# Patient Record
Sex: Male | Born: 1965 | Race: Black or African American | Hispanic: Refuse to answer | Marital: Single | Smoking: Current some day smoker
Health system: Southern US, Community
[De-identification: ages and names within clinical notes are randomized; demographics above are authoritative.]

## PROBLEM LIST (undated history)

## (undated) DIAGNOSIS — M48061 Spinal stenosis, lumbar region without neurogenic claudication: Secondary | ICD-10-CM

## (undated) DIAGNOSIS — M51379 Other intervertebral disc degeneration, lumbosacral region without mention of lumbar back pain or lower extremity pain: Secondary | ICD-10-CM

## (undated) DIAGNOSIS — E1165 Type 2 diabetes mellitus with hyperglycemia: Secondary | ICD-10-CM

## (undated) DIAGNOSIS — G894 Chronic pain syndrome: Secondary | ICD-10-CM

## (undated) DIAGNOSIS — I152 Hypertension secondary to endocrine disorders: Secondary | ICD-10-CM

## (undated) DIAGNOSIS — G5600 Carpal tunnel syndrome, unspecified upper limb: Secondary | ICD-10-CM

## (undated) DIAGNOSIS — E785 Hyperlipidemia, unspecified: Secondary | ICD-10-CM

## (undated) DIAGNOSIS — D126 Benign neoplasm of colon, unspecified: Secondary | ICD-10-CM

## (undated) DIAGNOSIS — E1159 Type 2 diabetes mellitus with other circulatory complications: Secondary | ICD-10-CM

## (undated) DIAGNOSIS — E119 Type 2 diabetes mellitus without complications: Secondary | ICD-10-CM

## (undated) DIAGNOSIS — T7840XA Allergy, unspecified, initial encounter: Secondary | ICD-10-CM

## (undated) DIAGNOSIS — I10 Essential (primary) hypertension: Secondary | ICD-10-CM

## (undated) DIAGNOSIS — M109 Gout, unspecified: Secondary | ICD-10-CM

## (undated) HISTORY — DX: Gout, unspecified: M10.9

## (undated) HISTORY — DX: Allergy, unspecified, initial encounter: T78.40XA

## (undated) HISTORY — DX: Type 2 diabetes mellitus without complications: E11.9

## (undated) HISTORY — PX: FRACTURE SURGERY: SHX138

## (undated) HISTORY — PX: SPINE SURGERY: SHX786

## (undated) HISTORY — DX: Essential (primary) hypertension: I10

## (undated) HISTORY — PX: TOOTH EXTRACTION: SUR596

## (undated) HISTORY — PX: TONSILLECTOMY: SUR1361

## (undated) HISTORY — PX: OTHER SURGICAL HISTORY: SHX169

---
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
## 2010-10-20 DIAGNOSIS — E785 Hyperlipidemia, unspecified: Secondary | ICD-10-CM | POA: Insufficient documentation

## 2012-04-08 DIAGNOSIS — E669 Obesity, unspecified: Secondary | ICD-10-CM | POA: Insufficient documentation

## 2013-12-15 DIAGNOSIS — M51379 Other intervertebral disc degeneration, lumbosacral region without mention of lumbar back pain or lower extremity pain: Secondary | ICD-10-CM | POA: Insufficient documentation

## 2013-12-15 DIAGNOSIS — M5137 Other intervertebral disc degeneration, lumbosacral region: Secondary | ICD-10-CM | POA: Insufficient documentation

## 2016-05-25 LAB — LIPID PANEL
Cholesterol: 150 (ref 0–200)
HDL: 45 (ref 35–70)
LDL Cholesterol: 89
Triglycerides: 80 (ref 40–160)

## 2017-04-18 DIAGNOSIS — M1A0690 Idiopathic chronic gout, unspecified knee, without tophus (tophi): Secondary | ICD-10-CM | POA: Insufficient documentation

## 2017-07-25 LAB — HEMOGLOBIN A1C: Hemoglobin A1C: 5.8

## 2017-12-18 ENCOUNTER — Telehealth: Payer: Self-pay | Admitting: Family Medicine

## 2017-12-18 NOTE — Telephone Encounter (Signed)
Called pt to confirm new patient appointment no answer left voicemail to call us back to confirm. If pt confirms please put in appointment notes.

## 2017-12-20 ENCOUNTER — Encounter: Payer: Self-pay | Admitting: Family Medicine

## 2017-12-20 ENCOUNTER — Ambulatory Visit (INDEPENDENT_AMBULATORY_CARE_PROVIDER_SITE_OTHER): Payer: 59 | Admitting: Family Medicine

## 2017-12-20 ENCOUNTER — Other Ambulatory Visit: Payer: Self-pay

## 2017-12-20 VITALS — BP 132/83 | HR 82 | Temp 98.4°F | Ht 71.5 in | Wt 219.0 lb

## 2017-12-20 DIAGNOSIS — M109 Gout, unspecified: Secondary | ICD-10-CM | POA: Insufficient documentation

## 2017-12-20 DIAGNOSIS — I10 Essential (primary) hypertension: Secondary | ICD-10-CM | POA: Insufficient documentation

## 2017-12-20 DIAGNOSIS — Z125 Encounter for screening for malignant neoplasm of prostate: Secondary | ICD-10-CM | POA: Diagnosis not present

## 2017-12-20 DIAGNOSIS — M1A9XX0 Chronic gout, unspecified, without tophus (tophi): Secondary | ICD-10-CM | POA: Diagnosis not present

## 2017-12-20 DIAGNOSIS — E119 Type 2 diabetes mellitus without complications: Secondary | ICD-10-CM | POA: Diagnosis not present

## 2017-12-20 DIAGNOSIS — E1165 Type 2 diabetes mellitus with hyperglycemia: Secondary | ICD-10-CM | POA: Insufficient documentation

## 2017-12-20 LAB — UA/M W/RFLX CULTURE, ROUTINE
Bilirubin, UA: NEGATIVE
GLUCOSE, UA: NEGATIVE
Ketones, UA: NEGATIVE
LEUKOCYTES UA: NEGATIVE
Nitrite, UA: NEGATIVE
PROTEIN UA: NEGATIVE
RBC, UA: NEGATIVE
Specific Gravity, UA: 1.02 (ref 1.005–1.030)
Urobilinogen, Ur: 1 mg/dL (ref 0.2–1.0)
pH, UA: 6.5 (ref 5.0–7.5)

## 2017-12-20 LAB — MICROALBUMIN, URINE WAIVED
CREATININE, URINE WAIVED: 100 mg/dL (ref 10–300)
Microalb, Ur Waived: 10 mg/L (ref 0–19)

## 2017-12-20 LAB — BAYER DCA HB A1C WAIVED: HB A1C (BAYER DCA - WAIVED): 5.4 % (ref <7.0)

## 2017-12-20 MED ORDER — AMLODIPINE BESYLATE 10 MG PO TABS: 10.0000 mg | ORAL_TABLET | Freq: Every day | ORAL | 1 refills | Status: DC

## 2017-12-20 MED ORDER — METFORMIN HCL ER 500 MG PO TB24: 500.0000 mg | ORAL_TABLET | Freq: Every day | ORAL | 1 refills | Status: DC

## 2017-12-20 MED ORDER — ALLOPURINOL 100 MG PO TABS: 100.0000 mg | ORAL_TABLET | Freq: Every day | ORAL | 1 refills | Status: DC

## 2017-12-20 MED ORDER — LISINOPRIL 20 MG PO TABS: 20.0000 mg | ORAL_TABLET | Freq: Every day | ORAL | 1 refills | Status: DC

## 2017-12-20 NOTE — Assessment & Plan Note (Signed)
Under great control with A1c of 5.4- will cut down to 1 metformin daily and recheck 3 months, if still in the low 5s, will consider stopping metformin.

## 2017-12-20 NOTE — Assessment & Plan Note (Signed)
Under good control on current regimen. Continue current regimen. Continue to monitor. Call with any concerns. Refills given. Checking labs today.  

## 2017-12-20 NOTE — Progress Notes (Signed)
BP 132/83   Pulse 82   Temp 98.4 F (36.9 C) (Oral)   Ht 5' 11.5" (1.816 m)   Wt 219 lb (99.3 kg)   SpO2 99%   BMI 30.12 kg/m    Subjective:    Patient ID: Chase Hull, male    DOB: 29-Sep-1965, 52 y.o.   MRN: [id number]  HPI: Chase Hull is a 52 y.o. male who presents today to establish care. He had been seeing a PCP through [facility]. Last saw them in May.  Chief Complaint  Patient presents with  . New Patient (Initial Visit)  . Hypertension  . Hyperlipidemia  . Diabetes  . Gout   HYPERTENSION Hypertension status: controlled  Satisfied with current treatment? yes Duration of hypertension: chronic BP monitoring frequency:  not checking BP medication side effects:  no Medication compliance: excellent compliance Previous BP meds: amlodipine, lisinopril Aspirin: yes Recurrent headaches: no Visual changes: no Palpitations: no Dyspnea: no Chest pain: no Lower extremity edema: no Dizzy/lightheaded: no  DIABETES Hypoglycemic episodes:no Polydipsia/polyuria: no Visual disturbance: no Chest pain: no Paresthesias: no Glucose Monitoring: yes  Accucheck frequency: Daily- fasting  Taking Insulin?: no Blood Pressure Monitoring: a few times a month Retinal Examination: Not up to Date Foot Exam: Done today Diabetic Education: Completed Pneumovax: Up to Date Influenza: Up to Date Aspirin: no  GOUT- no flares. Has been feeling well. Usually gets it in his foot.   Active Ambulatory Problems    Diagnosis Date Noted  . Hypertension   . Diabetes mellitus without complication ([place])   . Gout    Resolved Ambulatory Problems    Diagnosis Date Noted  . No Resolved Ambulatory Problems   No Additional Past Medical History   History reviewed. No pertinent surgical history. Outpatient Encounter Medications as of 12/20/2017  Medication Sig  . allopurinol (ZYLOPRIM) 100 MG tablet Take 1 tablet (100 mg total) by mouth daily.  Marland Kitchen amLODipine (NORVASC) 10 MG tablet Take 1  tablet (10 mg total) by mouth daily.  Marland Kitchen aspirin 81 MG tablet Take by mouth.  . Aspirin-Calcium Carbonate 81-777 MG TABS Take by mouth.  . Blood Glucose Monitoring Suppl (FIFTY50 GLUCOSE METER 2.0) w/Device KIT Use to check fasting blood sugars daily  . lisinopril (PRINIVIL,ZESTRIL) 20 MG tablet Take 1 tablet (20 mg total) by mouth daily.  . [DISCONTINUED] allopurinol (ZYLOPRIM) 100 MG tablet Take by mouth.  . [DISCONTINUED] amLODipine (NORVASC) 10 MG tablet TAKE 1 TABLET BY MOUTH EVERY DAY  . [DISCONTINUED] Influenza Vac Subunit Quad (FLUCELVAX QUADRIVALENT) 0.5 ML SUSY TO BE ADMINISTERED BY PHARMACIST FOR IMMUNIZATION  . [DISCONTINUED] lisinopril (PRINIVIL,ZESTRIL) 20 MG tablet Take by mouth.  . [DISCONTINUED] metFORMIN (GLUCOPHAGE) 500 MG tablet Take by mouth.  . metFORMIN (GLUCOPHAGE-XR) 500 MG 24 hr tablet Take 1 tablet (500 mg total) by mouth daily with breakfast.   No facility-administered encounter medications on file as of 12/20/2017.    No Known Allergies Social History   Socioeconomic History  . Marital status: Single    Spouse name: Not on file  . Number of children: Not on file  . Years of education: Not on file  . Highest education level: Not on file  Occupational History  . Not on file  Social Needs  . Financial resource strain: Not on file  . Food insecurity:    Worry: Not on file    Inability: Not on file  . Transportation needs:    Medical: Not on file    Non-medical: Not on  file  Tobacco Use  . Smoking status: Former Research scientist (life sciences)  . Smokeless tobacco: Never Used  Substance and Sexual Activity  . Alcohol use: Not Currently  . Drug use: Never  . Sexual activity: Not Currently  Lifestyle  . Physical activity:    Days per week: Not on file    Minutes per session: Not on file  . Stress: Not on file  Relationships  . Social connections:    Talks on phone: Not on file    Gets together: Not on file    Attends religious service: Not on file    Active member of  club or organization: Not on file    Attends meetings of clubs or organizations: Not on file    Relationship status: Not on file  Other Topics Concern  . Not on file  Social History Narrative  . Not on file   History reviewed. No pertinent family history.  Review of Systems  Constitutional: Negative.   Respiratory: Negative.   Cardiovascular: Negative.   Musculoskeletal: Negative.   Neurological: Negative.   Psychiatric/Behavioral: Negative.     Per HPI unless specifically indicated above     Objective:    BP 132/83   Pulse 82   Temp 98.4 F (36.9 C) (Oral)   Ht 5' 11.5" (1.816 m)   Wt 219 lb (99.3 kg)   SpO2 99%   BMI 30.12 kg/m   Wt Readings from Last 3 Encounters:  12/20/17 219 lb (99.3 kg)    Physical Exam  Constitutional: He is oriented to person, place, and time. He appears well-developed and well-nourished. No distress.  HENT:  Head: Normocephalic and atraumatic.  Right Ear: Hearing normal.  Left Ear: Hearing normal.  Nose: Nose normal.  Eyes: Conjunctivae and lids are normal. Right eye exhibits no discharge. Left eye exhibits no discharge. No scleral icterus.  Cardiovascular: Normal rate, regular rhythm, normal heart sounds and intact distal pulses. Exam reveals no gallop and no friction rub.  No murmur heard. Pulmonary/Chest: Effort normal and breath sounds normal. No stridor. No respiratory distress. He has no wheezes. He has no rales. He exhibits no tenderness.  Musculoskeletal: Normal range of motion.  Neurological: He is alert and oriented to person, place, and time.  Skin: Skin is warm, dry and intact. Capillary refill takes less than 2 seconds. No rash noted. He is not diaphoretic. No erythema. No pallor.  Psychiatric: He has a normal mood and affect. His speech is normal and behavior is normal. Judgment and thought content normal. Cognition and memory are normal.  Nursing note and vitals reviewed.   Results for orders placed or performed in visit  on 12/20/17  Hemoglobin A1c  Result Value Ref Range   Hemoglobin A1C 5.8   Lipid panel  Result Value Ref Range   Triglycerides 80 40 - 160   Cholesterol 150 0 - 200   HDL 45 35 - 70   LDL Cholesterol 89       Assessment & Plan:   Problem List Items Addressed This Visit      Cardiovascular and Mediastinum   Hypertension    Under good control on current regimen. Continue current regimen. Continue to monitor. Call with any concerns. Refills given. Checking labs today.       Relevant Medications   aspirin 81 MG tablet   Aspirin-Calcium Carbonate 81-777 MG TABS   amLODipine (NORVASC) 10 MG tablet   lisinopril (PRINIVIL,ZESTRIL) 20 MG tablet   Other Relevant Orders   CBC  with Differential/Platelet   Comprehensive metabolic panel   Microalbumin, Urine Waived   TSH   UA/M w/rflx Culture, Routine     Endocrine   Diabetes mellitus without complication ([place]) - Primary    Under great control with A1c of 5.4- will cut down to 1 metformin daily and recheck 3 months, if still in the low 5s, will consider stopping metformin.       Relevant Medications   aspirin 81 MG tablet   Aspirin-Calcium Carbonate 81-777 MG TABS   lisinopril (PRINIVIL,ZESTRIL) 20 MG tablet   metFORMIN (GLUCOPHAGE-XR) 500 MG 24 hr tablet   Other Relevant Orders   Bayer DCA Hb A1c Waived   CBC with Differential/Platelet   Comprehensive metabolic panel   Lipid Panel w/o Chol/HDL Ratio   Microalbumin, Urine Waived   TSH   UA/M w/rflx Culture, Routine   Ambulatory referral to Ophthalmology     Other   Gout    Under good control on current regimen. Continue current regimen. Continue to monitor. Call with any concerns. Refills given. Labs drawn today.       Relevant Orders   CBC with Differential/Platelet   Comprehensive metabolic panel   TSH   UA/M w/rflx Culture, Routine   Uric acid    Other Visit Diagnoses    Screening for prostate cancer       Checking labs today. Await results.    Relevant  Orders   PSA       Follow up plan: Return in about 3 months (around 03/22/2018).

## 2017-12-20 NOTE — Assessment & Plan Note (Signed)
Under good control on current regimen. Continue current regimen. Continue to monitor. Call with any concerns. Refills given. Labs drawn today.   

## 2017-12-22 LAB — COMPREHENSIVE METABOLIC PANEL
A/G RATIO: 2 (ref 1.2–2.2)
ALBUMIN: 4.4 g/dL (ref 3.5–5.5)
ALT: 46 IU/L — AB (ref 0–44)
AST: 60 IU/L — ABNORMAL HIGH (ref 0–40)
Alkaline Phosphatase: 114 IU/L (ref 39–117)
BILIRUBIN TOTAL: 0.2 mg/dL (ref 0.0–1.2)
BUN / CREAT RATIO: 13 (ref 9–20)
BUN: 13 mg/dL (ref 6–24)
CHLORIDE: 105 mmol/L (ref 96–106)
CO2: 24 mmol/L (ref 20–29)
Calcium: 10 mg/dL (ref 8.7–10.2)
Creatinine, Ser: 0.99 mg/dL (ref 0.76–1.27)
GFR calc non Af Amer: 89 mL/min/{1.73_m2} (ref >59)
GFR, EST AFRICAN AMERICAN: 103 mL/min/{1.73_m2} (ref >59)
Globulin, Total: 2.2 g/dL (ref 1.5–4.5)
Glucose: 94 mg/dL (ref 65–99)
POTASSIUM: 3.7 mmol/L (ref 3.5–5.2)
Sodium: 143 mmol/L (ref 134–144)
TOTAL PROTEIN: 6.6 g/dL (ref 6.0–8.5)

## 2017-12-22 LAB — CBC WITH DIFFERENTIAL/PLATELET
BASOS: 0 %
Basophils Absolute: 0 10*3/uL (ref 0.0–0.2)
EOS (ABSOLUTE): 0.2 10*3/uL (ref 0.0–0.4)
Eos: 2 %
HEMOGLOBIN: 13 g/dL (ref 13.0–17.7)
Hematocrit: 37.8 % (ref 37.5–51.0)
IMMATURE GRANS (ABS): 0 10*3/uL (ref 0.0–0.1)
Immature Granulocytes: 0 %
LYMPHS: 27 %
Lymphocytes Absolute: 2.3 10*3/uL (ref 0.7–3.1)
MCH: 30.8 pg (ref 26.6–33.0)
MCHC: 34.4 g/dL (ref 31.5–35.7)
MCV: 90 fL (ref 79–97)
Monocytes Absolute: 0.6 10*3/uL (ref 0.1–0.9)
Monocytes: 8 %
NEUTROS ABS: 5.4 10*3/uL (ref 1.4–7.0)
NEUTROS PCT: 63 %
PLATELETS: 328 10*3/uL (ref 150–450)
RBC: 4.22 x10E6/uL (ref 4.14–5.80)
RDW: 12.9 % (ref 12.3–15.4)
WBC: 8.5 10*3/uL (ref 3.4–10.8)

## 2017-12-22 LAB — LIPID PANEL W/O CHOL/HDL RATIO
CHOLESTEROL TOTAL: 161 mg/dL (ref 100–199)
HDL: 41 mg/dL (ref >39)
LDL CALC: 107 mg/dL — AB (ref 0–99)
Triglycerides: 64 mg/dL (ref 0–149)
VLDL CHOLESTEROL CAL: 13 mg/dL (ref 5–40)

## 2017-12-22 LAB — PSA: Prostate Specific Ag, Serum: 1.4 ng/mL (ref 0.0–4.0)

## 2017-12-22 LAB — TSH: TSH: 0.515 u[IU]/mL (ref 0.450–4.500)

## 2017-12-22 LAB — URIC ACID: URIC ACID: 8.9 mg/dL — AB (ref 3.7–8.6)

## 2017-12-23 ENCOUNTER — Telehealth: Payer: Self-pay | Admitting: Family Medicine

## 2017-12-23 DIAGNOSIS — M1A9XX0 Chronic gout, unspecified, without tophus (tophi): Secondary | ICD-10-CM

## 2017-12-23 MED ORDER — ALLOPURINOL 100 MG PO TABS: 200.0000 mg | ORAL_TABLET | Freq: Every day | ORAL | 1 refills | Status: DC

## 2017-12-23 NOTE — Telephone Encounter (Signed)
Message relayed to patient. Verbalized understanding and denied questions.   

## 2017-12-23 NOTE — Telephone Encounter (Signed)
Please let him know that his labs look normal except his uric acid levels were high. He should start taking 2 of his allopurinol a day, I've sent him a new Rx to his pharmacy and [place] like him to come back in in 1 month just for a lab test to recheck it.

## 2018-01-13 ENCOUNTER — Other Ambulatory Visit: Payer: 59

## 2018-01-13 DIAGNOSIS — M1A9XX0 Chronic gout, unspecified, without tophus (tophi): Secondary | ICD-10-CM

## 2018-01-14 ENCOUNTER — Ambulatory Visit
Admission: RE | Admit: 2018-01-14 | Discharge: 2018-01-14 | Disposition: A | Discharge status: Home / Self Care | Payer: 59 | Source: Ambulatory Visit | Attending: Family Medicine | Admitting: Family Medicine

## 2018-01-14 ENCOUNTER — Encounter: Payer: Self-pay | Admitting: Family Medicine

## 2018-01-14 ENCOUNTER — Other Ambulatory Visit: Payer: Self-pay

## 2018-01-14 ENCOUNTER — Ambulatory Visit (INDEPENDENT_AMBULATORY_CARE_PROVIDER_SITE_OTHER): Payer: 59 | Admitting: Family Medicine

## 2018-01-14 VITALS — BP 123/81 | HR 87 | Temp 98.5°F | Ht 72.0 in | Wt 221.0 lb

## 2018-01-14 DIAGNOSIS — R202 Paresthesia of skin: Secondary | ICD-10-CM

## 2018-01-14 DIAGNOSIS — M1A9XX0 Chronic gout, unspecified, without tophus (tophi): Secondary | ICD-10-CM

## 2018-01-14 DIAGNOSIS — M47812 Spondylosis without myelopathy or radiculopathy, cervical region: Secondary | ICD-10-CM | POA: Diagnosis not present

## 2018-01-14 LAB — COMPREHENSIVE METABOLIC PANEL
ALT: 25 IU/L (ref 0–44)
AST: 24 IU/L (ref 0–40)
Albumin/Globulin Ratio: 1.8 (ref 1.2–2.2)
Albumin: 4.3 g/dL (ref 3.5–5.5)
Alkaline Phosphatase: 106 IU/L (ref 39–117)
BUN/Creatinine Ratio: 13 (ref 9–20)
BUN: 13 mg/dL (ref 6–24)
Bilirubin Total: 0.2 mg/dL (ref 0.0–1.2)
CALCIUM: 9.8 mg/dL (ref 8.7–10.2)
CO2: 24 mmol/L (ref 20–29)
Chloride: 103 mmol/L (ref 96–106)
Creatinine, Ser: 1.01 mg/dL (ref 0.76–1.27)
GFR, EST AFRICAN AMERICAN: 100 mL/min/{1.73_m2} (ref >59)
GFR, EST NON AFRICAN AMERICAN: 86 mL/min/{1.73_m2} (ref >59)
GLUCOSE: 95 mg/dL (ref 65–99)
Globulin, Total: 2.4 g/dL (ref 1.5–4.5)
Potassium: 4.3 mmol/L (ref 3.5–5.2)
Sodium: 140 mmol/L (ref 134–144)
TOTAL PROTEIN: 6.7 g/dL (ref 6.0–8.5)

## 2018-01-14 LAB — URIC ACID: Uric Acid: 5.3 mg/dL (ref 3.7–8.6)

## 2018-01-14 MED ORDER — GABAPENTIN 100 MG PO CAPS: 100.0000 mg | ORAL_CAPSULE | Freq: Every day | ORAL | 3 refills | Status: DC

## 2018-01-14 MED ORDER — PREDNISONE 50 MG PO TABS: 50.0000 mg | ORAL_TABLET | Freq: Every day | ORAL | 0 refills | Status: DC

## 2018-01-14 NOTE — Progress Notes (Signed)
BP 123/81   Pulse 87   Temp 98.5 F (36.9 C) (Oral)   Ht 6' (1.829 m)   Wt 221 lb (100.2 kg)   SpO2 100%   BMI 29.97 kg/m    Subjective:    Patient ID: Chase Hull, male    DOB: Nov 03, 1965, 52 y.o.   MRN: [id number]  HPI: Chase Hull is a 52 y.o. male  Chief Complaint  Patient presents with  . Pain    bilateral hands/pt states has had numbness and pain for about a week   NUMBNESS Duration: 1 week Onset: sudden Location: bilateral, ll the fingers, mainly the first 3 fingers Bilateral: yes Symmetric: yes Decreased sensation: yes  Weakness: yes Pain: yes Quality:  Numb and tingling, pins and needles Severity: severe  Frequency: constant Trauma: yes Recent illness: no Diabetes: yes Thyroid disease: no  HIV: no  Alcoholism: no  Spinal cord injury: no Status: exacerbated Treatments attempted: aleve  Gout- has been tolerating his allopurinol well. Levels back to normal on labs yesterday. No other concerns.   Relevant past medical, surgical, family and social history reviewed and updated as indicated. Interim medical history since our last visit reviewed. Allergies and medications reviewed and updated.  Review of Systems  Constitutional: Negative.   Respiratory: Negative.   Cardiovascular: Negative.   Musculoskeletal: Negative.   Neurological: Positive for weakness and numbness. Negative for dizziness, tremors, seizures, syncope, facial asymmetry, speech difficulty, light-headedness and headaches.  Psychiatric/Behavioral: Negative.     Per HPI unless specifically indicated above     Objective:    BP 123/81   Pulse 87   Temp 98.5 F (36.9 C) (Oral)   Ht 6' (1.829 m)   Wt 221 lb (100.2 kg)   SpO2 100%   BMI 29.97 kg/m   Wt Readings from Last 3 Encounters:  01/14/18 221 lb (100.2 kg)  12/20/17 219 lb (99.3 kg)    Physical Exam  Constitutional: He is oriented to person, place, and time. He appears well-developed and well-nourished. No distress.    HENT:  Head: Normocephalic and atraumatic.  Right Ear: Hearing normal.  Left Ear: Hearing normal.  Nose: Nose normal.  Eyes: Conjunctivae and lids are normal. Right eye exhibits no discharge. Left eye exhibits no discharge. No scleral icterus.  Cardiovascular: Normal rate, regular rhythm, normal heart sounds and intact distal pulses. Exam reveals no gallop and no friction rub.  No murmur heard. Pulmonary/Chest: Effort normal and breath sounds normal. No stridor. No respiratory distress. He has no wheezes. He has no rales. He exhibits no tenderness.  Musculoskeletal: Normal range of motion.  +Tinel's bilaterally, +Phalen's bilaterally, mild hypertonicity in cervical spine  Neurological: He is alert and oriented to person, place, and time. A sensory deficit is present.  Skin: Skin is warm, dry and intact. Capillary refill takes less than 2 seconds. No rash noted. He is not diaphoretic. No erythema. No pallor.  Psychiatric: He has a normal mood and affect. His speech is normal and behavior is normal. Judgment and thought content normal. Cognition and memory are normal.  Nursing note and vitals reviewed.   Results for orders placed or performed in visit on 01/13/18  Uric acid  Result Value Ref Range   Uric Acid 5.3 3.7 - 8.6 mg/dL  Comprehensive metabolic panel  Result Value Ref Range   Glucose 95 65 - 99 mg/dL   BUN 13 6 - 24 mg/dL   Creatinine, Ser 1.01 0.76 - 1.27 mg/dL   GFR calc  non Af Amer 86 >59 mL/min/1.73   GFR calc Af Amer 100 >59 mL/min/1.73   BUN/Creatinine Ratio 13 9 - 20   Sodium 140 134 - 144 mmol/L   Potassium 4.3 3.5 - 5.2 mmol/L   Chloride 103 96 - 106 mmol/L   CO2 24 20 - 29 mmol/L   Calcium 9.8 8.7 - 10.2 mg/dL   Total Protein 6.7 6.0 - 8.5 g/dL   Albumin 4.3 3.5 - 5.5 g/dL   Globulin, Total 2.4 1.5 - 4.5 g/dL   Albumin/Globulin Ratio 1.8 1.2 - 2.2   Bilirubin Total 0.2 0.0 - 1.2 mg/dL   Alkaline Phosphatase 106 39 - 117 IU/L   AST 24 0 - 40 IU/L   ALT 25 0  - 44 IU/L      Assessment & Plan:   Problem List Items Addressed This Visit      Other   Gout     Has been tolerating his allopurinol well. Levels back to normal on labs yesterday. Refills given.        Other Visit Diagnoses    Paresthesias    -  Primary   Labs normal last visit- CMP normal yesterday. Will treat with gabapentin and burst of prednisone, stretches and get neck x-ray, if not better, refer to hand.   Relevant Orders   DG Cervical Spine Complete       Follow up plan: Return in about 2 weeks (around 01/28/2018) for follow up numbness.

## 2018-01-14 NOTE — Patient Instructions (Signed)
Carpal Tunnel Syndrome Carpal tunnel syndrome is a condition that causes pain in your hand and arm. The carpal tunnel is a narrow area located on the palm side of your wrist. Repeated wrist motion or certain diseases may cause swelling within the tunnel. This swelling pinches the main nerve in the wrist (median nerve). What are the causes? This condition may be caused by:  Repeated wrist motions.  Wrist injuries.  Arthritis.  A cyst or tumor in the carpal tunnel.  Fluid buildup during pregnancy.  Sometimes the cause of this condition is not known. What increases the risk? This condition is more likely to develop in:  People who have jobs that cause them to repeatedly move their wrists in the same motion, such as butchers and cashiers.  Women.  People with certain conditions, such as: ? Diabetes. ? Obesity. ? An underactive thyroid (hypothyroidism). ? Kidney failure.  What are the signs or symptoms? Symptoms of this condition include:  A tingling feeling in your fingers, especially in your thumb, index, and middle fingers.  Tingling or numbness in your hand.  An aching feeling in your entire arm, especially when your wrist and elbow are bent for long periods of time.  Wrist pain that goes up your arm to your shoulder.  Pain that goes down into your palm or fingers.  A weak feeling in your hands. You may have trouble grabbing and holding items.  Your symptoms may feel worse during the night. How is this diagnosed? This condition is diagnosed with a medical history and physical exam. You may also have tests, including:  An electromyogram (EMG). This test measures electrical signals sent by your nerves into the muscles.  X-rays.  How is this treated? Treatment for this condition includes:  Lifestyle changes. It is important to stop doing or modify the activity that caused your condition.  Physical or occupational therapy.  Medicines for pain and inflammation.  This may include medicine that is injected into your wrist.  A wrist splint.  Surgery.  Follow these instructions at home: If you have a splint:  Wear it as told by your health care provider. Remove it only as told by your health care provider.  Loosen the splint if your fingers become numb and tingle, or if they turn cold and blue.  Keep the splint clean and dry. General instructions  Take over-the-counter and prescription medicines only as told by your health care provider.  Rest your wrist from any activity that may be causing your pain. If your condition is work related, talk to your employer about changes that can be made, such as getting a wrist pad to use while typing.  If directed, apply ice to the painful area: ? Put ice in a plastic bag. ? Place a towel between your skin and the bag. ? Leave the ice on for 20 minutes, 2-3 times per day.  Keep all follow-up visits as told by your health care provider. This is important.  Do any exercises as told by your health care provider, physical therapist, or occupational therapist. Contact a health care provider if:  You have new symptoms.  Your pain is not controlled with medicines.  Your symptoms get worse. This information is not intended to replace advice given to you by your health care provider. Make sure you discuss any questions you have with your health care provider. Document Released: 02/10/2000 Document Revised: 06/23/2015 Document Reviewed: 06/30/2014 Elsevier Interactive Patient Education  2018 Elsevier Inc.  

## 2018-01-14 NOTE — Assessment & Plan Note (Signed)
Has been tolerating his allopurinol well. Levels back to normal on labs yesterday. Refills given.

## 2018-01-15 ENCOUNTER — Telehealth: Payer: Self-pay | Admitting: Family Medicine

## 2018-01-15 NOTE — Telephone Encounter (Signed)
Detailed message left relaying information after DPR was checked.

## 2018-01-15 NOTE — Telephone Encounter (Signed)
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 Let's see how he does on the treatment we talked about yesterday, and if he's not feeling better, he should let me know

## 2018-01-17 ENCOUNTER — Ambulatory Visit: Payer: 59 | Admitting: Family Medicine

## 2018-01-17 NOTE — Telephone Encounter (Signed)
Recommend he goes to [facility] walk in. We have already put him on prednisone.

## 2018-01-17 NOTE — Telephone Encounter (Signed)
FYI

## 2018-01-17 NOTE — Telephone Encounter (Signed)
Patient is stating his right wrist is in pain and swelling now

## 2018-01-28 ENCOUNTER — Encounter: Payer: Self-pay | Admitting: Family Medicine

## 2018-01-28 ENCOUNTER — Ambulatory Visit (INDEPENDENT_AMBULATORY_CARE_PROVIDER_SITE_OTHER): Payer: 59 | Admitting: Family Medicine

## 2018-01-28 VITALS — BP 126/83 | HR 86 | Temp 98.5°F | Wt 220.0 lb

## 2018-01-28 DIAGNOSIS — G5603 Carpal tunnel syndrome, bilateral upper limbs: Secondary | ICD-10-CM | POA: Diagnosis not present

## 2018-01-28 MED ORDER — GABAPENTIN 300 MG PO CAPS: 300.0000 mg | ORAL_CAPSULE | Freq: Three times a day (TID) | ORAL | 3 refills | Status: DC

## 2018-01-28 NOTE — Patient Instructions (Addendum)
200mg  gabapentin at night 100mg  in AM and at 200mg  bedtime for 3 days 100mg  in AM and PM and 200mg  at bedtime for 3 days 200mg  in AM and 100mg  in PM and 200mg  at bedtime for 3 days 200mg  3 x a day for 3 days 200mg  in AM and PM and 300mg  at bedtime for 3 days 300mg  in AM and 200mg  in PM and 300mg  at bedtime for 3 days 300mg  3 x a day after that

## 2018-01-28 NOTE — Progress Notes (Signed)
BP 126/83   Pulse 86   Temp 98.5 F (36.9 C) (Oral)   Wt 220 lb (99.8 kg)   SpO2 100%   BMI 29.84 kg/m    Subjective:    Patient ID: Chase Hull, male    DOB: 10/13/1965, 52 y.o.   MRN: [id number]  HPI: Chase Hull is a 52 y.o. male  Chief Complaint  Patient presents with  . Follow-up    Parestesia, bilateral hands. Same if not worse.    Was doing worse, went to [facility] on 01/17/18. They agreed with bilateral carpal tunnel and gave him steroid injections bilaterally, but they only seemed to help for about a day and then he got worse again. Continues with a lot of numbness and pain in his hands. Has been having trouble working because he can't feel his hands. Has been dropping things. He is otherwise feeling well with no other concerns or complaints at this time.   Relevant past medical, surgical, family and social history reviewed and updated as indicated. Interim medical history since our last visit reviewed. Allergies and medications reviewed and updated.  Review of Systems  Constitutional: Negative.   Respiratory: Negative.   Cardiovascular: Negative.   Musculoskeletal: Positive for myalgias. Negative for arthralgias, back pain, gait problem, joint swelling, neck pain and neck stiffness.  Skin: Negative.   Neurological: Positive for numbness. Negative for dizziness, tremors, seizures, syncope, facial asymmetry, speech difficulty, weakness, light-headedness and headaches.  Hematological: Negative.   Psychiatric/Behavioral: Negative.     Per HPI unless specifically indicated above     Objective:    BP 126/83   Pulse 86   Temp 98.5 F (36.9 C) (Oral)   Wt 220 lb (99.8 kg)   SpO2 100%   BMI 29.84 kg/m   Wt Readings from Last 3 Encounters:  01/28/18 220 lb (99.8 kg)  01/14/18 221 lb (100.2 kg)  12/20/17 219 lb (99.3 kg)    Physical Exam  Constitutional: He is oriented to person, place, and time. He appears well-developed and well-nourished. No distress.    HENT:  Head: Normocephalic and atraumatic.  Right Ear: Hearing normal.  Left Ear: Hearing normal.  Nose: Nose normal.  Eyes: Conjunctivae and lids are normal. Right eye exhibits no discharge. Left eye exhibits no discharge. No scleral icterus.  Cardiovascular: Normal rate, regular rhythm, normal heart sounds and intact distal pulses. Exam reveals no gallop and no friction rub.  No murmur heard. Pulmonary/Chest: Effort normal and breath sounds normal. No stridor. No respiratory distress. He has no wheezes. He has no rales. He exhibits no tenderness.  Musculoskeletal: Normal range of motion.  Neurological: He is alert and oriented to person, place, and time.  Skin: Skin is warm, dry and intact. Capillary refill takes less than 2 seconds. No rash noted. He is not diaphoretic. No erythema. No pallor.  Psychiatric: He has a normal mood and affect. His speech is normal and behavior is normal. Judgment and thought content normal. Cognition and memory are normal.  Nursing note and vitals reviewed.   Results for orders placed or performed in visit on 01/13/18  Uric acid  Result Value Ref Range   Uric Acid 5.3 3.7 - 8.6 mg/dL  Comprehensive metabolic panel  Result Value Ref Range   Glucose 95 65 - 99 mg/dL   BUN 13 6 - 24 mg/dL   Creatinine, Ser 1.01 0.76 - 1.27 mg/dL   GFR calc non Af Amer 86 >59 mL/min/1.73   GFR calc Af  Amer 100 >59 mL/min/1.73   BUN/Creatinine Ratio 13 9 - 20   Sodium 140 134 - 144 mmol/L   Potassium 4.3 3.5 - 5.2 mmol/L   Chloride 103 96 - 106 mmol/L   CO2 24 20 - 29 mmol/L   Calcium 9.8 8.7 - 10.2 mg/dL   Total Protein 6.7 6.0 - 8.5 g/dL   Albumin 4.3 3.5 - 5.5 g/dL   Globulin, Total 2.4 1.5 - 4.5 g/dL   Albumin/Globulin Ratio 1.8 1.2 - 2.2   Bilirubin Total 0.2 0.0 - 1.2 mg/dL   Alkaline Phosphatase 106 39 - 117 IU/L   AST 24 0 - 40 IU/L   ALT 25 0 - 44 IU/L      Assessment & Plan:   Problem List Items Addressed This Visit      Nervous and Auditory    Bilateral carpal tunnel syndrome - Primary    To have EMG on 02/06/18. Will increase gabapentin to 300mg  TID- see patient instructions for titrating up. Will check in by phone in 2 weeks, if getting worse, will get into see hand surgeon sooner.       Relevant Medications   gabapentin (NEURONTIN) 300 MG capsule       Follow up plan: Return if symptoms worsen or fail to improve.

## 2018-01-28 NOTE — Assessment & Plan Note (Signed)
To have EMG on 02/06/18. Will increase gabapentin to 300mg  TID- see patient instructions for titrating up. Will check in by phone in 2 weeks, if getting worse, will get into see hand surgeon sooner.

## 2018-01-29 ENCOUNTER — Encounter: Payer: Self-pay | Admitting: Family Medicine

## 2018-01-29 ENCOUNTER — Telehealth: Payer: Self-pay | Admitting: Family Medicine

## 2018-01-29 NOTE — Telephone Encounter (Signed)
Pt presented in person to clinic requesting work note extension - reviewed notes, will extend by 1 day per his request as Dr. Wynetta Emery is out of clinic. Will route to her as FYI  Copied from [place] [phone]. Topic: General - Other >> Jan 29, 2018 12:47 PM Lennox Solders wrote: Reason for CRM: pt is calling and needs another work note from 01-28-18 and return to work on 01-30-18 due to hand pain. >> Jan 29, 2018  1:18 PM Jill Side wrote: Please advise

## 2018-01-30 LAB — HM DIABETES EYE EXAM

## 2018-02-08 ENCOUNTER — Other Ambulatory Visit: Payer: Self-pay | Admitting: Family Medicine

## 2018-02-10 NOTE — Telephone Encounter (Signed)
Requested Prescriptions  Pending Prescriptions Disp Refills  . gabapentin (NEURONTIN) 300 MG capsule [Pharmacy Med Name: GABAPENTIN 300 MG CAPSULE] 180 capsule 0    Sig: TAKE 1 CAPSULE BY MOUTH THREE TIMES A DAY     Neurology: Anticonvulsants - gabapentin Passed - 02/08/2018 10:00 AM      Passed - Valid encounter within last 12 months    Recent Outpatient Visits          1 week ago Bilateral carpal tunnel syndrome   Kief, Megan P, DO   3 weeks ago Osino, Megan P, DO   1 month ago Diabetes mellitus without complication [facility])   Lucien, Megan P, DO      Future Appointments            In 1 month Johnson, Barb Merino, DO [employer]

## 2018-02-14 ENCOUNTER — Telehealth: Payer: Self-pay | Admitting: Family Medicine

## 2018-02-14 NOTE — Telephone Encounter (Signed)
Called and spoke with patient, he has surgery scheduled for 02-28-18.

## 2018-02-14 NOTE — Telephone Encounter (Signed)
-----   Message from Valerie Roys, DO sent at 01/28/2018  2:11 PM EST ----- Call to check in on his hands

## 2018-03-21 ENCOUNTER — Encounter: Payer: Self-pay | Admitting: Family Medicine

## 2018-03-21 ENCOUNTER — Other Ambulatory Visit: Payer: Self-pay

## 2018-03-21 ENCOUNTER — Ambulatory Visit (INDEPENDENT_AMBULATORY_CARE_PROVIDER_SITE_OTHER): Payer: 59 | Admitting: Family Medicine

## 2018-03-21 VITALS — BP 124/82 | HR 84 | Temp 98.4°F | Ht 72.0 in | Wt 227.0 lb

## 2018-03-21 DIAGNOSIS — E119 Type 2 diabetes mellitus without complications: Secondary | ICD-10-CM | POA: Diagnosis not present

## 2018-03-21 LAB — BAYER DCA HB A1C WAIVED: HB A1C: 5.9 % (ref <7.0)

## 2018-03-21 NOTE — Progress Notes (Signed)
BP 124/82   Pulse 84   Temp 98.4 F (36.9 C) (Oral)   Ht 6' (1.829 m)   Wt 227 lb (103 kg)   SpO2 98%   BMI 30.79 kg/m    Subjective:    Patient ID: Chase Hull, male    DOB: 1965-10-13, 53 y.o.   MRN: [id number]  HPI: Chase Hull is a 53 y.o. male  Chief Complaint  Patient presents with  . Diabetes     f/u. paper work for work   DIABETES Hypoglycemic episodes:no Polydipsia/polyuria: no Visual disturbance: no Chest pain: no Paresthesias: no Glucose Monitoring: yes  Accucheck frequency: very occasionally Taking Insulin?: no Blood Pressure Monitoring: not checking Retinal Examination: Up to Date Foot Exam: Up to Date Diabetic Education: Completed Pneumovax: Up to Date Influenza: Up to Date Aspirin: yes  Relevant past medical, surgical, family and social history reviewed and updated as indicated. Interim medical history since our last visit reviewed. Allergies and medications reviewed and updated.  Review of Systems  Constitutional: Negative.   Respiratory: Negative.   Cardiovascular: Negative.   Neurological: Positive for weakness and numbness. Negative for dizziness, tremors, seizures, syncope, facial asymmetry, speech difficulty, light-headedness and headaches.  Psychiatric/Behavioral: Negative.     Per HPI unless specifically indicated above     Objective:    BP 124/82   Pulse 84   Temp 98.4 F (36.9 C) (Oral)   Ht 6' (1.829 m)   Wt 227 lb (103 kg)   SpO2 98%   BMI 30.79 kg/m   Wt Readings from Last 3 Encounters:  03/21/18 227 lb (103 kg)  01/28/18 220 lb (99.8 kg)  01/14/18 221 lb (100.2 kg)    Physical Exam Vitals signs and nursing note reviewed.  Constitutional:      General: He is not in acute distress.    Appearance: Normal appearance. He is not ill-appearing, toxic-appearing or diaphoretic.  HENT:     Head: Normocephalic and atraumatic.     Right Ear: External ear normal.     Left Ear: External ear normal.     Nose: Nose normal.       Mouth/Throat:     Mouth: Mucous membranes are moist.     Pharynx: Oropharynx is clear.  Eyes:     General: No scleral icterus.       Right eye: No discharge.        Left eye: No discharge.     Extraocular Movements: Extraocular movements intact.     Conjunctiva/sclera: Conjunctivae normal.     Pupils: Pupils are equal, round, and reactive to light.  Neck:     Musculoskeletal: Normal range of motion and neck supple.  Cardiovascular:     Rate and Rhythm: Normal rate and regular rhythm.     Pulses: Normal pulses.     Heart sounds: Normal heart sounds. No murmur. No friction rub. No gallop.   Pulmonary:     Effort: Pulmonary effort is normal. No respiratory distress.     Breath sounds: Normal breath sounds. No stridor. No wheezing, rhonchi or rales.  Chest:     Chest wall: No tenderness.  Musculoskeletal: Normal range of motion.  Skin:    General: Skin is warm and dry.     Capillary Refill: Capillary refill takes less than 2 seconds.     Coloration: Skin is not jaundiced or pale.     Findings: No bruising, erythema, lesion or rash.  Neurological:     General: No focal deficit  present.     Mental Status: He is alert and oriented to person, place, and time. Mental status is at baseline.  Psychiatric:        Mood and Affect: Mood normal.        Behavior: Behavior normal.        Thought Content: Thought content normal.        Judgment: Judgment normal.     Results for orders placed or performed in visit on 02/03/18  HM DIABETES EYE EXAM  Result Value Ref Range   HM Diabetic Eye Exam No Retinopathy No Retinopathy      Assessment & Plan:   Problem List Items Addressed This Visit      Endocrine   Diabetes mellitus without complication ([place]) - Primary    Doing well with A1c of 5.9 on low dose metformin. Will try him off of it for 3 months and recheck A1c. Call with any concerns.       Relevant Orders   Bayer DCA Hb A1c Waived       Follow up plan: Return in about 3  months (around 06/20/2018) for Physical.

## 2018-03-21 NOTE — Assessment & Plan Note (Signed)
Doing well with A1c of 5.9 on low dose metformin. Will try him off of it for 3 months and recheck A1c. Call with any concerns.

## 2018-03-29 HISTORY — PX: CARPAL TUNNEL RELEASE: SHX101

## 2018-03-29 HISTORY — PX: OTHER SURGICAL HISTORY: SHX169

## 2018-06-19 ENCOUNTER — Telehealth: Payer: Self-pay | Admitting: Family Medicine

## 2018-06-19 NOTE — Telephone Encounter (Signed)
Called pt to set up virtual visit, no answer, left voicemail. °

## 2018-06-20 ENCOUNTER — Other Ambulatory Visit: Payer: Self-pay

## 2018-06-20 ENCOUNTER — Encounter: Payer: Self-pay | Admitting: Family Medicine

## 2018-06-20 ENCOUNTER — Encounter: Payer: 59 | Admitting: Family Medicine

## 2018-06-20 ENCOUNTER — Ambulatory Visit (INDEPENDENT_AMBULATORY_CARE_PROVIDER_SITE_OTHER): Payer: 59 | Admitting: Family Medicine

## 2018-06-20 VITALS — BP 135/84 | HR 92 | Temp 97.4°F | Wt 223.0 lb

## 2018-06-20 DIAGNOSIS — I10 Essential (primary) hypertension: Secondary | ICD-10-CM | POA: Diagnosis not present

## 2018-06-20 DIAGNOSIS — E119 Type 2 diabetes mellitus without complications: Secondary | ICD-10-CM | POA: Diagnosis not present

## 2018-06-20 DIAGNOSIS — M1A9XX0 Chronic gout, unspecified, without tophus (tophi): Secondary | ICD-10-CM | POA: Diagnosis not present

## 2018-06-20 DIAGNOSIS — E782 Mixed hyperlipidemia: Secondary | ICD-10-CM

## 2018-06-20 DIAGNOSIS — M1A0690 Idiopathic chronic gout, unspecified knee, without tophus (tophi): Secondary | ICD-10-CM

## 2018-06-20 MED ORDER — AMLODIPINE BESYLATE 10 MG PO TABS: 10.0000 mg | ORAL_TABLET | Freq: Every day | ORAL | 1 refills | Status: DC

## 2018-06-20 MED ORDER — LISINOPRIL 20 MG PO TABS: 20.0000 mg | ORAL_TABLET | Freq: Every day | ORAL | 1 refills | Status: DC

## 2018-06-20 MED ORDER — ALLOPURINOL 100 MG PO TABS: 200.0000 mg | ORAL_TABLET | Freq: Every day | ORAL | 1 refills | Status: DC

## 2018-06-20 NOTE — Assessment & Plan Note (Signed)
Had a flare a couple of days ago. Will recheck labs and adjust medication as needed. Call with any concerns. Continue to monitor.

## 2018-06-20 NOTE — Assessment & Plan Note (Signed)
Under good control on current regimen. Continue current regimen. Continue to monitor. Call with any concerns. Refills given. Labs to be drawn.  

## 2018-06-20 NOTE — Progress Notes (Signed)
BP 135/84 Comment: pt reported- virtual visit  Pulse 92 Comment: pt reported- virtual visit  Temp (!) 97.4 F (36.3 C) (Oral) Comment: pt reported- virtual visit  Wt 223 lb (101.2 kg) Comment: pt reported- virtual visit  BMI 30.24 kg/m    Subjective:    Patient ID: Chase Hull, male    DOB: 1965-04-08, 53 y.o.   MRN: [id number]  HPI: Chase Hull is a 53 y.o. male  Chief Complaint  Patient presents with  . Diabetes  . Gout  . Hyperlipidemia  . Hypertension   HYPERTENSION / [place] Satisfied with current treatment? yes Duration of hypertension: chronic BP monitoring frequency: not checking BP medication side effects: no Past BP meds: amlodipine, lisinopril Duration of hyperlipidemia: chronic Cholesterol medication side effects: no Cholesterol supplements: none Past cholesterol medications: not on anything Medication compliance: excellent compliance Aspirin: yes Recent stressors: no Recurrent headaches: no Visual changes: no Palpitations: no Dyspnea: no Chest pain: no Lower extremity edema: no Dizzy/lightheaded: no  DIABETES Hypoglycemic episodes:no Polydipsia/polyuria: no Visual disturbance: no Chest pain: no Paresthesias: no Glucose Monitoring: yes  Accucheck frequency: Not Checking Taking Insulin?: no Blood Pressure Monitoring: not checking Retinal Examination: Up to Date Foot Exam: Up to Date Diabetic Education: Completed Pneumovax: Up to Date Influenza: Up to Date Aspirin: yes  Gout- had a gout flare about 3 days ago, had been about a year and a half since he had had one. His L ankle swelled up and was very painful. Was out of work. Now better.   Relevant past medical, surgical, family and social history reviewed and updated as indicated. Interim medical history since our last visit reviewed. Allergies and medications reviewed and updated.  Review of Systems  Constitutional: Negative.   Respiratory: Negative.   Cardiovascular: Negative.    Musculoskeletal: Positive for arthralgias. Negative for back pain, gait problem, joint swelling, myalgias, neck pain and neck stiffness.  Skin: Negative.   Psychiatric/Behavioral: Negative.     Per HPI unless specifically indicated above     Objective:    BP 135/84 Comment: pt reported- virtual visit  Pulse 92 Comment: pt reported- virtual visit  Temp (!) 97.4 F (36.3 C) (Oral) Comment: pt reported- virtual visit  Wt 223 lb (101.2 kg) Comment: pt reported- virtual visit  BMI 30.24 kg/m   Wt Readings from Last 3 Encounters:  06/20/18 223 lb (101.2 kg)  03/21/18 227 lb (103 kg)  01/28/18 220 lb (99.8 kg)    Physical Exam Vitals signs and nursing note reviewed.  Constitutional:      General: He is not in acute distress.    Appearance: Normal appearance. He is not ill-appearing, toxic-appearing or diaphoretic.  HENT:     Head: Normocephalic and atraumatic.     Right Ear: External ear normal.     Left Ear: External ear normal.     Nose: Nose normal.     Mouth/Throat:     Mouth: Mucous membranes are moist.     Pharynx: Oropharynx is clear.  Eyes:     General: No scleral icterus.       Right eye: No discharge.        Left eye: No discharge.     Conjunctiva/sclera: Conjunctivae normal.     Pupils: Pupils are equal, round, and reactive to light.  Neck:     Musculoskeletal: Normal range of motion.  Pulmonary:     Effort: Pulmonary effort is normal. No respiratory distress.     Comments: Speaking in full sentences  Musculoskeletal: Normal range of motion.  Skin:    Coloration: Skin is not jaundiced or pale.     Findings: No bruising, erythema, lesion or rash.  Neurological:     Mental Status: He is alert and oriented to person, place, and time. Mental status is at baseline.  Psychiatric:        Mood and Affect: Mood normal.        Behavior: Behavior normal.        Thought Content: Thought content normal.        Judgment: Judgment normal.     Results for orders  placed or performed in visit on 03/21/18  Bayer DCA Hb A1c Waived  Result Value Ref Range   HB A1C (BAYER DCA - WAIVED) 5.9 <7.0 %      Assessment & Plan:   Problem List Items Addressed This Visit      Cardiovascular and Mediastinum   Hypertension    Under good control on current regimen. Continue current regimen. Continue to monitor. Call with any concerns. Refills given. Labs to be drawn.        Relevant Medications   amLODipine (NORVASC) 10 MG tablet   lisinopril (ZESTRIL) 20 MG tablet   Other Relevant Orders   Comprehensive metabolic panel   Microalbumin, Urine Waived     Endocrine   Diabetes mellitus without complication ([place]) - Primary    Under good control on current regimen. Continue current regimen. Continue to monitor. Call with any concerns. Refills given. Labs to be drawn.       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 Will recheck labs and adjust medication as needed. Call with any concerns. Continue to monitor.       Relevant Medications   allopurinol (ZYLOPRIM) 100 MG tablet   Other Relevant Orders   Comprehensive metabolic panel   Uric acid     Other   Gout    Had a flare a couple of days ago. Will recheck labs and adjust medication as needed. Call with any concerns. Continue to monitor.       Relevant Orders   Comprehensive metabolic panel   Uric acid   Comprehensive metabolic panel   Uric acid   Hyperlipidemia    Stable off medicine. A1c has been in the prediabetic range. Will hold on statin. Continue to monitor.       Relevant Medications   amLODipine (NORVASC) 10 MG tablet   lisinopril (ZESTRIL) 20 MG tablet   Other Relevant Orders   Comprehensive metabolic panel   Lipid Panel w/o Chol/HDL  Ratio       Follow up plan: Return in about 6 months (around 12/20/2018) for Physical.   . This visit was completed via Skype due to the restrictions of the COVID-19 pandemic. All issues as above were discussed and addressed. Physical exam was done as above through visual confirmation on Skype. If it was felt that the patient should be evaluated in the office, they were directed there. The patient verbally consented to this visit. . Location of the patient: home . Location of the provider: home . Those involved with this call:  . Provider: Park Liter, DO . CMA: Yvonna Alanis,  CMA . Front Desk/Registration: Linard Millers  . Time spent on call: 25 minutes with patient face to face via video conference. More than 50% of this time was spent in counseling and coordination of care. 40 minutes total spent in review of patient's record and preparation of their chart.

## 2018-06-20 NOTE — Assessment & Plan Note (Signed)
Stable off medicine. A1c has been in the prediabetic range. Will hold on statin. Continue to monitor.

## 2018-06-24 ENCOUNTER — Other Ambulatory Visit: Payer: 59

## 2018-06-24 ENCOUNTER — Other Ambulatory Visit: Payer: Self-pay

## 2018-06-24 DIAGNOSIS — M1A0690 Idiopathic chronic gout, unspecified knee, without tophus (tophi): Secondary | ICD-10-CM

## 2018-06-24 DIAGNOSIS — E782 Mixed hyperlipidemia: Secondary | ICD-10-CM

## 2018-06-24 DIAGNOSIS — M1A9XX0 Chronic gout, unspecified, without tophus (tophi): Secondary | ICD-10-CM

## 2018-06-24 DIAGNOSIS — E119 Type 2 diabetes mellitus without complications: Secondary | ICD-10-CM

## 2018-06-24 DIAGNOSIS — I10 Essential (primary) hypertension: Secondary | ICD-10-CM

## 2018-06-24 LAB — MICROALBUMIN, URINE WAIVED
Creatinine, Urine Waived: 100 mg/dL (ref 10–300)
Microalb, Ur Waived: 10 mg/L (ref 0–19)
Microalb/Creat Ratio: <30 mg/g (ref <30)

## 2018-06-24 LAB — BAYER DCA HB A1C WAIVED: HB A1C (BAYER DCA - WAIVED): 6.1 % (ref <7.0)

## 2018-06-25 LAB — COMPREHENSIVE METABOLIC PANEL
ALT: 26 IU/L (ref 0–44)
AST: 22 IU/L (ref 0–40)
Albumin/Globulin Ratio: 1.7 (ref 1.2–2.2)
Albumin: 4.3 g/dL (ref 4.0–5.0)
Alkaline Phosphatase: 117 IU/L (ref 39–117)
BUN/Creatinine Ratio: 15 (ref 9–20)
BUN: 17 mg/dL (ref 6–24)
Bilirubin Total: <0.2 mg/dL (ref 0.0–1.2)
CO2: 21 mmol/L (ref 20–29)
Calcium: 10 mg/dL (ref 8.7–10.2)
Chloride: 103 mmol/L (ref 96–106)
Creatinine, Ser: 1.14 mg/dL (ref 0.76–1.27)
GFR calc Af Amer: 86 mL/min/{1.73_m2} (ref >59)
GFR calc non Af Amer: 75 mL/min/{1.73_m2} (ref >59)
Globulin, Total: 2.6 g/dL (ref 1.5–4.5)
Glucose: 140 mg/dL — ABNORMAL HIGH (ref 65–99)
Potassium: 4 mmol/L (ref 3.5–5.2)
Sodium: 142 mmol/L (ref 134–144)
Total Protein: 6.9 g/dL (ref 6.0–8.5)

## 2018-06-25 LAB — LIPID PANEL W/O CHOL/HDL RATIO
Cholesterol, Total: 171 mg/dL (ref 100–199)
HDL: 38 mg/dL — ABNORMAL LOW (ref >39)
LDL Calculated: 111 mg/dL — ABNORMAL HIGH (ref 0–99)
Triglycerides: 112 mg/dL (ref 0–149)
VLDL Cholesterol Cal: 22 mg/dL (ref 5–40)

## 2018-06-25 LAB — URIC ACID: Uric Acid: 6.9 mg/dL (ref 3.7–8.6)

## 2018-06-26 ENCOUNTER — Encounter: Payer: Self-pay | Admitting: Family Medicine

## 2018-09-19 ENCOUNTER — Encounter: Payer: 59 | Admitting: Family Medicine

## 2018-11-12 ENCOUNTER — Other Ambulatory Visit: Payer: Self-pay | Admitting: Family Medicine

## 2019-02-06 ENCOUNTER — Other Ambulatory Visit: Payer: Self-pay | Admitting: Family Medicine

## 2019-02-06 NOTE — Telephone Encounter (Signed)
Routing to provider  

## 2019-02-06 NOTE — Telephone Encounter (Signed)
Requested medication (s) are due for refill today: yes  Requested medication (s) are on the active medication list: yes  Last refill:  12/08/2018  Future visit scheduled: no  Notes to clinic:  overdue for office visit  Review for refill   Requested Prescriptions  Pending Prescriptions Disp Refills   allopurinol (ZYLOPRIM) 100 MG tablet [Pharmacy Med Name: ALLOPURINOL 100 MG TABLET] 180 tablet 0    Sig: TAKE 2 TABLETS BY [place]      Endocrinology:  Gout Agents Passed - 02/06/2019 11:21 AM      Passed - Uric Acid in normal range and within 360 days    Uric Acid  Date Value Ref Range Status  06/24/2018 6.9 3.7 - 8.6 mg/dL Final    Comment:               Therapeutic target for gout patients: <6.0          Passed - Cr in normal range and within 360 days    Creatinine, Ser  Date Value Ref Range Status  06/24/2018 1.14 0.76 - 1.27 mg/dL Final          Passed - Valid encounter within last 12 months    Recent Outpatient Visits           7 months ago Diabetes mellitus without complication ([place])   [place], Megan P, DO   10 months ago Diabetes mellitus without complication ([place])   [place], Megan P, DO   1 year ago Bilateral carpal tunnel syndrome   [facility] [place], Megan P, DO   1 year ago Paresthesias   [place], Megan P, DO   1 year ago Diabetes mellitus without complication ([place])   [place], Megan P, DO                lisinopril (ZESTRIL) 20 MG tablet [Pharmacy Med Name: LISINOPRIL 20 MG TABLET] 90 tablet 1    Sig: TAKE 1 TABLET BY MOUTH EVERY DAY      Cardiovascular:  ACE Inhibitors Failed - 02/06/2019 11:21 AM      Failed - Cr in normal range and within 180 days    Creatinine, Ser  Date Value Ref Range Status  06/24/2018 1.14 0.76 - 1.27 mg/dL Final          Failed - K in normal range and within 180 days    Potassium  Date Value Ref  Range Status  06/24/2018 4.0 3.5 - 5.2 mmol/L Final          Failed - Valid encounter within last 6 months    Recent Outpatient Visits           7 months ago Diabetes mellitus without complication ([place])   [place], Megan P, DO   10 months ago Diabetes mellitus without complication ([place])   [place], Megan P, DO   1 year ago Bilateral carpal tunnel syndrome   [facility] [place], [place], DO   1 year ago Paresthesias   [place], Megan P, DO   1 year ago Diabetes mellitus without complication [facility])   [place], [place], DO              Passed - Patient is not pregnant      Passed - Last BP in normal range    BP Readings from Last 1 Encounters:  06/20/18 135/84            amLODipine (NORVASC) 10 MG tablet [Pharmacy Med Name: AMLODIPINE BESYLATE 10 MG TAB] 90 tablet 1    Sig: TAKE 1 TABLET BY MOUTH EVERY DAY      Cardiovascular:  Calcium Channel Blockers Failed - 02/06/2019 11:21 AM      Failed - Valid encounter within last 6 months    Recent Outpatient Visits           7 months ago Diabetes mellitus without complication ([place])   [place], Megan P, DO   10 months ago Diabetes mellitus without complication [facility])   [place], Megan P, DO   1 year ago Bilateral carpal tunnel syndrome   [place], [place], DO   1 year ago [place], Megan P, DO   1 year ago Diabetes mellitus without complication [facility])   [facility], Megan P, DO              Passed - Last BP in normal range    BP Readings from Last 1 Encounters:  06/20/18 135/84

## 2019-02-06 NOTE — Telephone Encounter (Signed)
Needs appointment

## 2019-02-09 NOTE — Telephone Encounter (Signed)
Called pt schedule appt for 03/12/19

## 2019-02-23 ENCOUNTER — Other Ambulatory Visit: Payer: Self-pay | Admitting: Family Medicine

## 2019-02-23 NOTE — Telephone Encounter (Signed)
Medication Refill - Medication: amlodipine, lisinopril  Has the patient contacted their pharmacy? Yes.   Pt called stating he is completely out of both medications. Please advise.  (Agent: If no, request that the patient contact the pharmacy for the refill.) (Agent: If yes, when and what did the pharmacy advise?)  Preferred Pharmacy (with phone number or street name):  [id number] - [place], [address]  [address] [place] [address]  Phone: [phone] Fax: [phone]  Not a 24 hour pharmacy; exact hours not known.     Agent: Please be advised that RX refills may take up to 3 business days. We ask that you follow-up with your pharmacy.

## 2019-02-23 NOTE — Telephone Encounter (Signed)
Requested medication (s) are due for refill today: no  Requested medication (s) are on the active medication list: yes  Last refill:  02/06/2020  Future visit scheduled:yes  Notes to clinic:  Patient has appointment on 03/12/2019   Requested Prescriptions  Pending Prescriptions Disp Refills   amLODipine (NORVASC) 10 MG tablet 30 tablet 0    Sig: Take 1 tablet (10 mg total) by mouth daily.      Cardiovascular:  Calcium Channel Blockers Failed - 02/23/2019 11:39 AM      Failed - Valid encounter within last 6 months    Recent Outpatient Visits           8 months ago Diabetes mellitus without complication [facility])   [place], Megan P, DO   11 months ago Diabetes mellitus without complication [facility])   [place], Megan P, DO   1 year ago Bilateral carpal tunnel syndrome   [place], [place], DO   1 year ago Paresthesias   [place], Megan P, DO   1 year ago Diabetes mellitus without complication [facility])   [place], [place], DO       Future Appointments             In 2 weeks [place], Megan P, DO [place], [facility]            Passed - Last BP in normal range    BP Readings from Last 1 Encounters:  06/20/18 135/84            lisinopril (ZESTRIL) 20 MG tablet 30 tablet 0    Sig: Take 1 tablet (20 mg total) by mouth daily.      Cardiovascular:  ACE Inhibitors Failed - 02/23/2019 11:39 AM      Failed - Cr in normal range and within 180 days    Creatinine, Ser  Date Value Ref Range Status  06/24/2018 1.14 0.76 - 1.27 mg/dL Final          Failed - K in normal range and within 180 days    Potassium  Date Value Ref Range Status  06/24/2018 4.0 3.5 - 5.2 mmol/L Final          Failed - Valid encounter within last 6 months    Recent Outpatient Visits           8 months ago Diabetes mellitus without complication ([place])   [facility], Megan P, DO   11 months ago Diabetes mellitus without complication [facility])   [place], Megan P, DO   1 year ago Bilateral carpal tunnel syndrome   [place], Megan P, DO   1 year ago Paresthesias   [place], [place], DO   1 year ago Diabetes mellitus without complication [facility])   [place], [place], DO       Future Appointments             In 2 weeks [place], Megan P, DO [place], [place] - Patient is not pregnant      Passed - Last BP in normal range    BP Readings from Last 1 Encounters:  06/20/18 135/84

## 2019-02-23 NOTE — Telephone Encounter (Signed)
Called and spoke with pharmacy, both medications are ready for pick up.

## 2019-03-12 ENCOUNTER — Ambulatory Visit (INDEPENDENT_AMBULATORY_CARE_PROVIDER_SITE_OTHER): Payer: 59 | Admitting: Family Medicine

## 2019-03-12 ENCOUNTER — Encounter: Payer: Self-pay | Admitting: Family Medicine

## 2019-03-12 ENCOUNTER — Other Ambulatory Visit: Payer: Self-pay

## 2019-03-12 VITALS — BP 125/79 | HR 70 | Temp 97.2°F | Wt 227.0 lb

## 2019-03-12 DIAGNOSIS — Z125 Encounter for screening for malignant neoplasm of prostate: Secondary | ICD-10-CM

## 2019-03-12 DIAGNOSIS — I10 Essential (primary) hypertension: Secondary | ICD-10-CM | POA: Diagnosis not present

## 2019-03-12 DIAGNOSIS — E119 Type 2 diabetes mellitus without complications: Secondary | ICD-10-CM | POA: Diagnosis not present

## 2019-03-12 DIAGNOSIS — Z1211 Encounter for screening for malignant neoplasm of colon: Secondary | ICD-10-CM

## 2019-03-12 DIAGNOSIS — E782 Mixed hyperlipidemia: Secondary | ICD-10-CM | POA: Diagnosis not present

## 2019-03-12 DIAGNOSIS — Z114 Encounter for screening for human immunodeficiency virus [HIV]: Secondary | ICD-10-CM

## 2019-03-12 DIAGNOSIS — M1A0690 Idiopathic chronic gout, unspecified knee, without tophus (tophi): Secondary | ICD-10-CM | POA: Diagnosis not present

## 2019-03-12 MED ORDER — ALLOPURINOL 100 MG PO TABS: 200.0000 mg | ORAL_TABLET | Freq: Every day | ORAL | 1 refills | Status: DC

## 2019-03-12 MED ORDER — LISINOPRIL 20 MG PO TABS: 20.0000 mg | ORAL_TABLET | Freq: Every day | ORAL | 1 refills | Status: DC

## 2019-03-12 MED ORDER — AMLODIPINE BESYLATE 10 MG PO TABS: 10.0000 mg | ORAL_TABLET | Freq: Every day | ORAL | 1 refills | Status: DC

## 2019-03-12 NOTE — Progress Notes (Signed)
BP 125/79   Pulse 70   Temp (!) 97.2 F (36.2 C)   Wt 227 lb (103 kg)   BMI 30.79 kg/m    Subjective:    Patient ID: Chase Hull, male    DOB: Nov 25, 1965, 54 y.o.   MRN: [id number]  HPI: Chase Hull is a 54 y.o. male  Chief Complaint  Patient presents with  . Hypertension    needs 90 day supply of meds per insurance  . Gout   HYPERTENSION / [place] Satisfied with current treatment? yes Duration of hypertension: chronic BP monitoring frequency: not checking BP medication side effects: no Past BP meds: lisinopril, amlodipine Duration of hyperlipidemia: chronic Cholesterol medication side effects: not on anything Cholesterol supplements: none Past cholesterol medications: none Medication compliance: excellent compliance Aspirin: yes Recent stressors: yes Recurrent headaches: no Visual changes: no Palpitations: no Dyspnea: no Chest pain: no Lower extremity edema: no Dizzy/lightheaded: no  DIABETES Hypoglycemic episodes:no Polydipsia/polyuria: no Visual disturbance: no Chest pain: no Paresthesias: no Glucose Monitoring: no  Accucheck frequency: Not Checking Taking Insulin?: no Blood Pressure Monitoring: not checking Retinal Examination: Not up to Date Foot Exam: Not up to Date Diabetic Education: Completed Pneumovax: Up to Date Influenza: Up to Date Aspirin: yes  GOUT- No flares. Tolerating medicine well. No concerns.   Relevant past medical, surgical, family and social history reviewed and updated as indicated. Interim medical history since our last visit reviewed. Allergies and medications reviewed and updated.  Review of Systems  Constitutional: Negative.   Respiratory: Negative.   Cardiovascular: Negative.   Musculoskeletal: Negative.   Psychiatric/Behavioral: Negative.     Per HPI unless specifically indicated above     Objective:    BP 125/79   Pulse 70   Temp (!) 97.2 F (36.2 C)   Wt 227 lb (103 kg)   BMI 30.79 kg/m   Wt  Readings from Last 3 Encounters:  03/12/19 227 lb (103 kg)  06/20/18 223 lb (101.2 kg)  03/21/18 227 lb (103 kg)    Physical Exam Vitals and nursing note reviewed.  Constitutional:      General: He is not in acute distress.    Appearance: Normal appearance. He is not ill-appearing, toxic-appearing or diaphoretic.  HENT:     Head: Normocephalic and atraumatic.     Right Ear: External ear normal.     Left Ear: External ear normal.     Nose: Nose normal.     Mouth/Throat:     Mouth: Mucous membranes are moist.     Pharynx: Oropharynx is clear.  Eyes:     General: No scleral icterus.       Right eye: No discharge.        Left eye: No discharge.     Conjunctiva/sclera: Conjunctivae normal.     Pupils: Pupils are equal, round, and reactive to light.  Pulmonary:     Effort: Pulmonary effort is normal. No respiratory distress.     Comments: Speaking in full sentences Musculoskeletal:        General: Normal range of motion.     Cervical back: Normal range of motion.  Skin:    Coloration: Skin is not jaundiced or pale.     Findings: No bruising, erythema, lesion or rash.  Neurological:     Mental Status: He is alert and oriented to person, place, and time. Mental status is at baseline.  Psychiatric:        Mood and Affect: Mood normal.  Behavior: Behavior normal.        Thought Content: Thought content normal.        Judgment: Judgment normal.     Results for orders placed or performed in visit on 06/24/18  Uric acid  Result Value Ref Range   Uric Acid 6.9 3.7 - 8.6 mg/dL  Microalbumin, Urine Waived  Result Value Ref Range   Microalb, Ur Waived 10 0 - 19 mg/L   Creatinine, Urine Waived 100 10 - 300 mg/dL   Microalb/Creat Ratio <30 <30 mg/g  Lipid Panel w/o Chol/HDL Ratio  Result Value Ref Range   Cholesterol, Total 171 100 - 199 mg/dL   Triglycerides 112 0 - 149 mg/dL   HDL 38 (L) >39 mg/dL   VLDL Cholesterol Cal 22 5 - 40 mg/dL   LDL Calculated 111 (H) 0 - 99  mg/dL  Comprehensive metabolic panel  Result Value Ref Range   Glucose 140 (H) 65 - 99 mg/dL   BUN 17 6 - 24 mg/dL   Creatinine, Ser 1.14 0.76 - 1.27 mg/dL   GFR calc non Af Amer 75 >59 mL/min/1.73   GFR calc Af Amer 86 >59 mL/min/1.73   BUN/Creatinine Ratio 15 9 - 20   Sodium 142 134 - 144 mmol/L   Potassium 4.0 3.5 - 5.2 mmol/L   Chloride 103 96 - 106 mmol/L   CO2 21 20 - 29 mmol/L   Calcium 10.0 8.7 - 10.2 mg/dL   Total Protein 6.9 6.0 - 8.5 g/dL   Albumin 4.3 4.0 - 5.0 g/dL   Globulin, Total 2.6 1.5 - 4.5 g/dL   Albumin/Globulin Ratio 1.7 1.2 - 2.2   Bilirubin Total <0.2 0.0 - 1.2 mg/dL   Alkaline Phosphatase 117 39 - 117 IU/L   AST 22 0 - 40 IU/L   ALT 26 0 - 44 IU/L  Bayer DCA Hb A1c Waived  Result Value Ref Range   HB A1C (BAYER DCA - WAIVED) 6.1 <7.0 %      Assessment & Plan:   Problem List Items Addressed This Visit      Cardiovascular and Mediastinum   Hypertension - Primary    Under good control on current regimen. Continue current regimen. Continue to monitor. Call with any concerns. Refills given. Labs to be drawn ASAP.        Relevant Medications   lisinopril (ZESTRIL) 20 MG tablet   amLODipine (NORVASC) 10 MG tablet   Other Relevant Orders   CBC with Differential OUT (Completed)   Comp Met (CMET) (Completed)   Microalbumin, Urine Waived (Completed)   TSH (Completed)   UA/M w/rflx Culture, Routine (Completed)     Endocrine   Diabetes mellitus without complication ([place])    Has been feeling well. Will get labs drawn ASAP and adjust as needed. Call with any concerns.       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 Continue current regimen. Continue to monitor. Call with any concerns. Refills given. Labs to be drawn ASAP.        Relevant Medications   allopurinol (ZYLOPRIM) 100 MG tablet  Other Relevant Orders   CBC with Differential OUT (Completed)   Comp Met (CMET) (Completed)   UA/M w/rflx Culture, Routine (Completed)   Uric acid (Completed)     Other   Hyperlipidemia    Under good control on current regimen. Continue current regimen. Continue to monitor. Call with any concerns. Refills given. Labs to be drawn ASAP.        Relevant Medications   lisinopril (ZESTRIL) 20 MG tablet   amLODipine (NORVASC) 10 MG tablet   Other Relevant Orders   CBC with Differential OUT (Completed)   Comp Met (CMET) (Completed)   Lipid Panel w/o Chol/HDL Ratio OUT (Completed)   UA/M w/rflx Culture, Routine (Completed)    Other Visit Diagnoses    Screening for HIV without presence of risk factors       Labs to be drawn ASAP. Await results.    Relevant Orders   HIV antibody (with reflex) (Completed)   Screening for prostate cancer       Labs to be drawn ASAP. Await results.    Relevant Orders   PSA (Completed)   Screening for colon cancer       Referral to GI made today.    Relevant Orders   Ambulatory referral to Gastroenterology       Follow up plan: Return in about 3 months (around 06/10/2019) for DM follow up.   . This visit was completed via [employer] due to the restrictions of the COVID-19 pandemic. All issues as above were discussed and addressed. Physical exam was done as above through visual confirmation on D[employer]imity. If it was felt that the patient should be evaluated in the office, they were directed there. The patient verbally consented to this visit. . Location of the patient: home . Location of the provider: home . Those involved with this call:  . Provider: Park Liter, DO . CMA: Tiffany Reel, CMA . Front Desk/Registration: Don Perking  . Time  spent on call: 25 minutes with patient face to face via video conference. More than 50% of this time was spent in counseling and coordination of care. 40 minutes total spent in review of patient's record and preparation of their chart.

## 2019-03-13 ENCOUNTER — Other Ambulatory Visit: Payer: Self-pay

## 2019-03-13 ENCOUNTER — Telehealth: Payer: Self-pay

## 2019-03-13 ENCOUNTER — Other Ambulatory Visit: Payer: 59

## 2019-03-13 DIAGNOSIS — Z114 Encounter for screening for human immunodeficiency virus [HIV]: Secondary | ICD-10-CM

## 2019-03-13 DIAGNOSIS — I10 Essential (primary) hypertension: Secondary | ICD-10-CM

## 2019-03-13 DIAGNOSIS — M1A0690 Idiopathic chronic gout, unspecified knee, without tophus (tophi): Secondary | ICD-10-CM

## 2019-03-13 DIAGNOSIS — Z125 Encounter for screening for malignant neoplasm of prostate: Secondary | ICD-10-CM

## 2019-03-13 DIAGNOSIS — E119 Type 2 diabetes mellitus without complications: Secondary | ICD-10-CM

## 2019-03-13 DIAGNOSIS — E782 Mixed hyperlipidemia: Secondary | ICD-10-CM

## 2019-03-13 DIAGNOSIS — Z1211 Encounter for screening for malignant neoplasm of colon: Secondary | ICD-10-CM

## 2019-03-13 LAB — UA/M W/RFLX CULTURE, ROUTINE
Bilirubin, UA: NEGATIVE
Glucose, UA: NEGATIVE
Ketones, UA: NEGATIVE
Leukocytes,UA: NEGATIVE
Nitrite, UA: NEGATIVE
Protein,UA: NEGATIVE
RBC, UA: NEGATIVE
Specific Gravity, UA: 1.02 (ref 1.005–1.030)
Urobilinogen, Ur: 1 mg/dL (ref 0.2–1.0)
pH, UA: 6.5 (ref 5.0–7.5)

## 2019-03-13 LAB — MICROALBUMIN, URINE WAIVED
Creatinine, Urine Waived: 100 mg/dL (ref 10–300)
Microalb, Ur Waived: 10 mg/L (ref 0–19)
Microalb/Creat Ratio: <30 mg/g (ref <30)

## 2019-03-13 LAB — BAYER DCA HB A1C WAIVED: HB A1C (BAYER DCA - WAIVED): 6.4 % (ref <7.0)

## 2019-03-13 NOTE — Telephone Encounter (Signed)
Gastroenterology Pre-Procedure Review  Request Date: Monday 03/30/19 Requesting Physician: Dr. Vicente Males  PATIENT REVIEW QUESTIONS: The patient responded to the following health history questions as indicated:    1. Are you having any GI issues? no 2. Do you have a personal history of Polyps? no 3. Do you have a family history of Colon Cancer or Polyps? No 4. Diabetes Mellitus? yes (controlled with diet) 5. Joint replacements in the past 12 months?no 6. Major health problems in the past 3 months?no 7. Any artificial heart valves, MVP, or defibrillator?no    MEDICATIONS & ALLERGIES:    Patient reports the following regarding taking any anticoagulation/antiplatelet therapy:   Plavix, Coumadin, Eliquis, Xarelto, Lovenox, Pradaxa, Brilinta, or Effient? no Aspirin? yes (81 mg daily)  Patient confirms/reports the following medications:  Current Outpatient Medications  Medication Sig Dispense Refill  . allopurinol (ZYLOPRIM) 100 MG tablet Take 2 tablets (200 mg total) by mouth daily. 180 tablet 1  . amLODipine (NORVASC) 10 MG tablet Take 1 tablet (10 mg total) by mouth daily. 90 tablet 1  . aspirin 81 MG tablet Take by mouth.    . Blood Glucose Monitoring Suppl (FIFTY50 GLUCOSE METER 2.0) w/Device KIT Use to check fasting blood sugars daily    . lisinopril (ZESTRIL) 20 MG tablet Take 1 tablet (20 mg total) by mouth daily. 90 tablet 1   No current facility-administered medications for this visit.    Patient confirms/reports the following allergies:  No Known Allergies  No orders of the defined types were placed in this encounter.   AUTHORIZATION INFORMATION Primary Insurance: 1D#: Group #:  Secondary Insurance: 1D#: Group #:  SCHEDULE INFORMATION: Date: 03/30/19 Time: Location:[facility]

## 2019-03-14 LAB — LIPID PANEL W/O CHOL/HDL RATIO
Cholesterol, Total: 199 mg/dL (ref 100–199)
HDL: 39 mg/dL — ABNORMAL LOW (ref >39)
LDL Chol Calc (NIH): 149 mg/dL — ABNORMAL HIGH (ref 0–99)
Triglycerides: 60 mg/dL (ref 0–149)
VLDL Cholesterol Cal: 11 mg/dL (ref 5–40)

## 2019-03-14 LAB — COMPREHENSIVE METABOLIC PANEL
ALT: 19 IU/L (ref 0–44)
AST: 19 IU/L (ref 0–40)
Albumin/Globulin Ratio: 1.7 (ref 1.2–2.2)
Albumin: 4.5 g/dL (ref 3.8–4.9)
Alkaline Phosphatase: 119 IU/L — ABNORMAL HIGH (ref 39–117)
BUN/Creatinine Ratio: 10 (ref 9–20)
BUN: 10 mg/dL (ref 6–24)
Bilirubin Total: 0.4 mg/dL (ref 0.0–1.2)
CO2: 22 mmol/L (ref 20–29)
Calcium: 9.7 mg/dL (ref 8.7–10.2)
Chloride: 102 mmol/L (ref 96–106)
Creatinine, Ser: 1.01 mg/dL (ref 0.76–1.27)
GFR calc Af Amer: 99 mL/min/{1.73_m2} (ref >59)
GFR calc non Af Amer: 86 mL/min/{1.73_m2} (ref >59)
Globulin, Total: 2.6 g/dL (ref 1.5–4.5)
Glucose: 111 mg/dL — ABNORMAL HIGH (ref 65–99)
Potassium: 3.8 mmol/L (ref 3.5–5.2)
Sodium: 141 mmol/L (ref 134–144)
Total Protein: 7.1 g/dL (ref 6.0–8.5)

## 2019-03-14 LAB — CBC WITH DIFFERENTIAL/PLATELET
Basophils Absolute: 0 10*3/uL (ref 0.0–0.2)
Basos: 1 %
EOS (ABSOLUTE): 0.2 10*3/uL (ref 0.0–0.4)
Eos: 2 %
Hematocrit: 40.4 % (ref 37.5–51.0)
Hemoglobin: 14.2 g/dL (ref 13.0–17.7)
Immature Grans (Abs): 0 10*3/uL (ref 0.0–0.1)
Immature Granulocytes: 0 %
Lymphocytes Absolute: 1.6 10*3/uL (ref 0.7–3.1)
Lymphs: 20 %
MCH: 31.8 pg (ref 26.6–33.0)
MCHC: 35.1 g/dL (ref 31.5–35.7)
MCV: 91 fL (ref 79–97)
Monocytes Absolute: 0.5 10*3/uL (ref 0.1–0.9)
Monocytes: 7 %
Neutrophils Absolute: 5.6 10*3/uL (ref 1.4–7.0)
Neutrophils: 70 %
Platelets: 275 10*3/uL (ref 150–450)
RBC: 4.46 x10E6/uL (ref 4.14–5.80)
RDW: 13.1 % (ref 11.6–15.4)
WBC: 7.9 10*3/uL (ref 3.4–10.8)

## 2019-03-14 LAB — HIV ANTIBODY (ROUTINE TESTING W REFLEX): HIV Screen 4th Generation wRfx: NONREACTIVE

## 2019-03-14 LAB — URIC ACID: Uric Acid: 4.6 mg/dL (ref 3.8–8.4)

## 2019-03-14 LAB — PSA: Prostate Specific Ag, Serum: 1.2 ng/mL (ref 0.0–4.0)

## 2019-03-14 LAB — TSH: TSH: 0.53 u[IU]/mL (ref 0.450–4.500)

## 2019-03-15 ENCOUNTER — Encounter: Payer: Self-pay | Admitting: Family Medicine

## 2019-03-15 NOTE — Assessment & Plan Note (Signed)
Under good control on current regimen. Continue current regimen. Continue to monitor. Call with any concerns. Refills given. Labs to be drawn ASAP.   

## 2019-03-15 NOTE — Assessment & Plan Note (Signed)
Has been feeling well. Will get labs drawn ASAP and adjust as needed. Call with any concerns.

## 2019-03-16 ENCOUNTER — Encounter: Payer: Self-pay | Admitting: Family Medicine

## 2019-03-19 ENCOUNTER — Other Ambulatory Visit: Payer: Self-pay

## 2019-03-19 NOTE — Telephone Encounter (Signed)
Medication was just filled but requesting medications and refills to be sent to [facility]. Amlodipine, Lisinopril. Allopurinol.

## 2019-03-23 ENCOUNTER — Other Ambulatory Visit: Payer: Self-pay

## 2019-03-23 ENCOUNTER — Encounter: Payer: Self-pay | Admitting: Gastroenterology

## 2019-03-26 ENCOUNTER — Other Ambulatory Visit: Payer: Self-pay

## 2019-03-26 ENCOUNTER — Other Ambulatory Visit
Admission: RE | Admit: 2019-03-26 | Discharge: 2019-03-26 | Disposition: A | Discharge status: Home / Self Care | Payer: 59 | Source: Ambulatory Visit | Attending: Gastroenterology | Admitting: Gastroenterology

## 2019-03-26 DIAGNOSIS — Z20822 Contact with and (suspected) exposure to covid-19: Secondary | ICD-10-CM | POA: Insufficient documentation

## 2019-03-26 DIAGNOSIS — Z01812 Encounter for preprocedural laboratory examination: Secondary | ICD-10-CM | POA: Insufficient documentation

## 2019-03-26 LAB — SARS CORONAVIRUS 2 (TAT 6-24 HRS): SARS Coronavirus 2: NEGATIVE

## 2019-03-27 NOTE — Discharge Instructions (Signed)
General Anesthesia, Adult, Care After This sheet gives you information about how to care for yourself after your procedure. Your health care provider may also give you more specific instructions. If you have problems or questions, contact your health care provider. What can I expect after the procedure? After the procedure, the following side effects are common:  Pain or discomfort at the IV site.  Nausea.  Vomiting.  Sore throat.  Trouble concentrating.  Feeling cold or chills.  Weak or tired.  Sleepiness and fatigue.  Soreness and body aches. These side effects can affect parts of the body that were not involved in surgery. Follow these instructions at home:  For at least 24 hours after the procedure:  Have a responsible adult stay with you. It is important to have someone help care for you until you are awake and alert.  Rest as needed.  Do not: ? Participate in activities in which you could fall or become injured. ? Drive. ? Use heavy machinery. ? Drink alcohol. ? Take sleeping pills or medicines that cause drowsiness. ? Make important decisions or sign legal documents. ? Take care of children on your own. Eating and drinking  Follow any instructions from your health care provider about eating or drinking restrictions.  When you feel hungry, start by eating small amounts of foods that are soft and easy to digest (bland), such as toast. Gradually return to your regular diet.  Drink enough fluid to keep your urine pale yellow.  If you vomit, rehydrate by drinking water, juice, or clear broth. General instructions  If you have sleep apnea, surgery and certain medicines can increase your risk for breathing problems. Follow instructions from your health care provider about wearing your sleep device: ? Anytime you are sleeping, including during daytime naps. ? While taking prescription pain medicines, sleeping medicines, or medicines that make you drowsy.  Return to  your normal activities as told by your health care provider. Ask your health care provider what activities are safe for you.  Take over-the-counter and prescription medicines only as told by your health care provider.  If you smoke, do not smoke without supervision.  Keep all follow-up visits as told by your health care provider. This is important. Contact a health care provider if:  You have nausea or vomiting that does not get better with medicine.  You cannot eat or drink without vomiting.  You have pain that does not get better with medicine.  You are unable to pass urine.  You develop a skin rash.  You have a fever.  You have redness around your IV site that gets worse. Get help right away if:  You have difficulty breathing.  You have chest pain.  You have blood in your urine or stool, or you vomit blood. Summary  After the procedure, it is common to have a sore throat or nausea. It is also common to feel tired.  Have a responsible adult stay with you for the first 24 hours after general anesthesia. It is important to have someone help care for you until you are awake and alert.  When you feel hungry, start by eating small amounts of foods that are soft and easy to digest (bland), such as toast. Gradually return to your regular diet.  Drink enough fluid to keep your urine pale yellow.  Return to your normal activities as told by your health care provider. Ask your health care provider what activities are safe for you. This information is not   intended to replace advice given to you by your health care provider. Make sure you discuss any questions you have with your health care provider. Document Revised: 02/15/2017 Document Reviewed: 09/28/2016 Elsevier Patient Education  2020 [employer]  

## 2019-03-30 ENCOUNTER — Ambulatory Visit
Admission: RE | Admit: 2019-03-30 | Discharge: 2019-03-30 | Disposition: A | Discharge status: Home / Self Care | Payer: 59 | Attending: Gastroenterology | Admitting: Gastroenterology

## 2019-03-30 ENCOUNTER — Other Ambulatory Visit: Payer: Self-pay

## 2019-03-30 ENCOUNTER — Ambulatory Visit: Payer: 59 | Admitting: Anesthesiology

## 2019-03-30 ENCOUNTER — Encounter
Admission: RE | Disposition: A | Discharge status: Home / Self Care | Payer: Self-pay | Source: Home / Self Care | Attending: Gastroenterology

## 2019-03-30 ENCOUNTER — Encounter: Payer: Self-pay | Admitting: Gastroenterology

## 2019-03-30 DIAGNOSIS — Z7982 Long term (current) use of aspirin: Secondary | ICD-10-CM | POA: Insufficient documentation

## 2019-03-30 DIAGNOSIS — I10 Essential (primary) hypertension: Secondary | ICD-10-CM | POA: Insufficient documentation

## 2019-03-30 DIAGNOSIS — K640 First degree hemorrhoids: Secondary | ICD-10-CM | POA: Insufficient documentation

## 2019-03-30 DIAGNOSIS — Z1211 Encounter for screening for malignant neoplasm of colon: Secondary | ICD-10-CM | POA: Diagnosis present

## 2019-03-30 DIAGNOSIS — K635 Polyp of colon: Secondary | ICD-10-CM | POA: Diagnosis not present

## 2019-03-30 DIAGNOSIS — M109 Gout, unspecified: Secondary | ICD-10-CM | POA: Insufficient documentation

## 2019-03-30 DIAGNOSIS — E119 Type 2 diabetes mellitus without complications: Secondary | ICD-10-CM | POA: Diagnosis not present

## 2019-03-30 DIAGNOSIS — D122 Benign neoplasm of ascending colon: Secondary | ICD-10-CM | POA: Insufficient documentation

## 2019-03-30 DIAGNOSIS — Z79899 Other long term (current) drug therapy: Secondary | ICD-10-CM | POA: Diagnosis not present

## 2019-03-30 DIAGNOSIS — F17210 Nicotine dependence, cigarettes, uncomplicated: Secondary | ICD-10-CM | POA: Insufficient documentation

## 2019-03-30 HISTORY — PX: COLONOSCOPY WITH PROPOFOL: SHX5780

## 2019-03-30 HISTORY — DX: Carpal tunnel syndrome, unspecified upper limb: G56.00

## 2019-03-30 HISTORY — PX: POLYPECTOMY: SHX5525

## 2019-03-30 SURGERY — COLONOSCOPY WITH PROPOFOL
Anesthesia: General | Site: Rectum

## 2019-03-30 MED ORDER — LACTATED RINGERS IV SOLN
10.0000 mL/h | INTRAVENOUS | Status: DC
  Administered 2019-03-30: 10 mL/h via INTRAVENOUS

## 2019-03-30 MED ORDER — ACETAMINOPHEN 160 MG/5ML PO SOLN: 325.0000 mg | Freq: Once | ORAL | Status: DC

## 2019-03-30 MED ORDER — ACETAMINOPHEN 325 MG PO TABS: 325.0000 mg | ORAL_TABLET | Freq: Once | ORAL | Status: DC

## 2019-03-30 MED ORDER — PROPOFOL 10 MG/ML IV BOLUS
INTRAVENOUS | Status: DC | PRN
  Administered 2019-03-30: 150 mg via INTRAVENOUS
  Administered 2019-03-30: 20 mg via INTRAVENOUS
  Administered 2019-03-30: 40 mg via INTRAVENOUS
  Administered 2019-03-30: 50 mg via INTRAVENOUS
  Administered 2019-03-30: 40 mg via INTRAVENOUS
  Administered 2019-03-30 (×2): 20 mg via INTRAVENOUS

## 2019-03-30 MED ORDER — LIDOCAINE HCL (CARDIAC) PF 100 MG/5ML IV SOSY
PREFILLED_SYRINGE | INTRAVENOUS | Status: DC | PRN
  Administered 2019-03-30: 30 mg via INTRAVENOUS

## 2019-03-30 MED ORDER — STERILE WATER FOR IRRIGATION IR SOLN
Status: DC | PRN
  Administered 2019-03-30: 50 mL

## 2019-03-30 SURGICAL SUPPLY — 7 items
CANISTER SUCT 1200ML W/VALVE (MISCELLANEOUS) ×2 IMPLANT
FORCEPS BIOP RAD 4 LRG CAP 4 (CUTTING FORCEPS) IMPLANT
GOWN CVR UNV OPN BCK APRN NK (MISCELLANEOUS) ×2 IMPLANT
GOWN ISOL THUMB LOOP REG UNIV (MISCELLANEOUS) ×2
KIT ENDO PROCEDURE OLY (KITS) ×2 IMPLANT
TRAP ETRAP POLY (MISCELLANEOUS) ×2 IMPLANT
WATER STERILE IRR 250ML POUR (IV SOLUTION) ×2 IMPLANT

## 2019-03-30 NOTE — Anesthesia Preprocedure Evaluation (Signed)
Anesthesia Evaluation  Patient identified by MRN, date of birth, ID band Patient awake    Reviewed: Allergy & Precautions, H&P , NPO status , Patient's Chart, lab work & pertinent test results  Airway Mallampati: II  TM Distance: >3 FB Neck ROM: full    Dental no notable dental hx.    Pulmonary Current Smoker and Patient abstained from smoking.,    Pulmonary exam normal breath sounds clear to auscultation       Cardiovascular hypertension, Normal cardiovascular exam Rhythm:regular Rate:Normal     Neuro/Psych    GI/Hepatic (+) C  Endo/Other  diabetes, Well Controlled  Renal/GU      Musculoskeletal   Abdominal   Peds  Hematology   Anesthesia Other Findings   Reproductive/Obstetrics                             Anesthesia Physical Anesthesia Plan  ASA: II  Anesthesia Plan: General   Post-op Pain Management:    Induction: Intravenous  PONV Risk Score and Plan: 2 and Treatment may vary due to age or medical condition, TIVA and Propofol infusion  Airway Management Planned: Natural Airway  Additional Equipment:   Intra-op Plan:   Post-operative Plan:   Informed Consent: I have reviewed the patients History and Physical, chart, labs and discussed the procedure including the risks, benefits and alternatives for the proposed anesthesia with the patient or authorized representative who has indicated his/her understanding and acceptance.     Dental Advisory Given  Plan Discussed with: CRNA  Anesthesia Plan Comments:         Anesthesia Quick Evaluation

## 2019-03-30 NOTE — Anesthesia Procedure Notes (Signed)
Performed by: Andrika Peraza, CRNA Pre-anesthesia Checklist: Patient identified, Emergency Drugs available, Suction available, Timeout performed and Patient being monitored Patient Re-evaluated:Patient Re-evaluated prior to induction Oxygen Delivery Method: Nasal cannula Placement Confirmation: positive ETCO2       

## 2019-03-30 NOTE — Anesthesia Postprocedure Evaluation (Signed)
Anesthesia Post Note  Patient: Chase Hull  Procedure(s) Performed: COLONOSCOPY WITH BIOPSY (N/A Rectum) POLYPECTOMY (N/A Rectum)     Patient location during evaluation: PACU Anesthesia Type: General Level of consciousness: awake and alert and oriented Pain management: satisfactory to patient Vital Signs Assessment: post-procedure vital signs reviewed and stable Respiratory status: spontaneous breathing, nonlabored ventilation and respiratory function stable Cardiovascular status: blood pressure returned to baseline and stable Postop Assessment: Adequate PO intake and No signs of nausea or vomiting Anesthetic complications: no    Raliegh Ip

## 2019-03-30 NOTE — Op Note (Signed)
[facility] Gastroenterology Patient Name: Chase Hull Procedure Date: 03/30/2019 10:20 AM MRN: [id number] Account #: [id number] Date of Birth: 1965/10/12 Admit Type: Outpatient Age: 54 Room: [facility] OR ROOM 01 Gender: Male Note Status: Finalized Procedure:             Colonoscopy Indications:           Screening for colorectal malignant neoplasm Providers:             Jonathon Bellows MD, MD Referring MD:          Valerie Roys (Referring MD) Medicines:             Monitored Anesthesia Care Complications:         No immediate complications. Procedure:             Pre-Anesthesia Assessment:                        - Prior to the procedure, a History and Physical was                         performed, and patient medications, allergies and                         sensitivities were reviewed. The patient's tolerance                         of previous anesthesia was reviewed.                        - The risks and benefits of the procedure and the                         sedation options and risks were discussed with the                         patient. All questions were answered and informed                         consent was obtained.                        - ASA Grade Assessment: II - A patient with mild                         systemic disease.                        After obtaining informed consent, the colonoscope was                         passed under direct vision. Throughout the procedure,                         the patient's blood pressure, pulse, and oxygen                         saturations were monitored continuously. The                         Colonoscope was introduced through the anus and  advanced to the the cecum, identified by the                         appendiceal orifice. The colonoscopy was performed                         with ease. The patient tolerated the procedure well.                         The quality of the  bowel preparation was good. Findings:      The perianal and digital rectal examinations were normal.      Three sessile polyps were found in the ascending colon. The polyps were       4 to 7 mm in size. These polyps were removed with a cold snare.       Resection and retrieval were complete.      Non-bleeding internal hemorrhoids were found during retroflexion. The       hemorrhoids were large and Grade I (internal hemorrhoids that do not       prolapse).      The exam was otherwise without abnormality on direct and retroflexion       views. Impression:            - Three 4 to 7 mm polyps in the ascending colon,                         removed with a cold snare. Resected and retrieved.                        - Non-bleeding internal hemorrhoids.                        - The examination was otherwise normal on direct and                         retroflexion views. Recommendation:        - Discharge patient to home (with escort).                        - Resume previous diet.                        - Continue present medications.                        - Await pathology results.                        - Repeat colonoscopy for surveillance based on                         pathology results. Procedure Code(s):     --- Professional ---                        [id number], Colonoscopy, flexible; with removal of                         tumor(s), polyp(s), or other lesion(s) by snare  technique Diagnosis Code(s):     --- Professional ---                        Z12.11, Encounter for screening for malignant neoplasm                         of colon                        K63.5, Polyp of colon                        K64.0, First degree hemorrhoids CPT copyright 2019 [employer]. All rights reserved. The codes documented in this report are preliminary and upon coder review may  be revised to meet current compliance requirements. Jonathon Bellows, MD Jonathon Bellows MD,  MD 03/30/2019 10:45:15 AM This report has been signed electronically. Number of Addenda: 0 Note Initiated On: 03/30/2019 10:20 AM Scope Withdrawal Time: 0 hours 13 minutes 35 seconds  Total Procedure Duration: 0 hours 15 minutes 44 seconds  Estimated Blood Loss:  Estimated blood loss: none.      [facility]

## 2019-03-30 NOTE — H&P (Signed)
Jonathon Bellows, MD [address] [place], [place], [place], [address] [address], [place], [place], [place], [address] Phone: [phone]  Fax: [phone]  Primary Care Physician:  Valerie Roys, DO   Pre-Procedure History & Physical: HPI:  [place] is a 54 y.o. male is here for an colonoscopy.   Past Medical History:  Diagnosis Date  . Carpal tunnel syndrome   . Diabetes mellitus without complication (HCC)    no meds, A1C down  . Gout   . Hypertension     Past Surgical History:  Procedure Laterality Date  . Left hand surgery  03/2018   Carpal tunnel    Prior to Admission medications   Medication Sig Start Date End Date Taking? Authorizing Provider  allopurinol (ZYLOPRIM) 100 MG tablet Take 2 tablets (200 mg total) by mouth daily. 03/12/19  Yes Johnson, Megan P, DO  amLODipine (NORVASC) 10 MG tablet Take 1 tablet (10 mg total) by mouth daily. 03/12/19  Yes Johnson, Megan P, DO  aspirin 81 MG tablet Take by mouth.   Yes [provider]  CALCIUM PO Take by mouth daily.   Yes [provider]  lisinopril (ZESTRIL) 20 MG tablet Take 1 tablet (20 mg total) by mouth daily. 03/12/19  Yes Johnson, Megan P, DO  MAGNESIUM PO Take by mouth daily.   Yes [provider]  Multiple Vitamins-Minerals (ZINC PO) Take by mouth daily.   Yes [provider]  Blood Glucose Monitoring Suppl (FIFTY50 GLUCOSE METER 2.0) w/Device KIT Use to check fasting blood sugars daily 12/15/13   [provider]    Allergies as of 03/13/2019  . (No Known Allergies)    History reviewed. No pertinent family history.  Social History   Socioeconomic History  . Marital status: Single    Spouse name: Not on file  . Number of children: Not on file  . Years of education: Not on file  . Highest education level: Not on file  Occupational History  . Not on file  Tobacco Use  . Smoking status: Current Every Day Smoker    Packs/day: 0.66    Years:  25.00    Pack years: 16.50    Types: Cigarettes  . Smokeless tobacco: Never Used  Substance and Sexual Activity  . Alcohol use: Not Currently  . Drug use: Never  . Sexual activity: Not Currently  Other Topics Concern  . Not on file  Social History Narrative  . Not on file   Social Determinants of Health   Financial Resource Strain:   . Difficulty of Paying Living Expenses: Not on file  Food Insecurity:   . Worried About Charity fundraiser in the Last Year: Not on file  . Ran Out of Food in the Last Year: Not on file  Transportation Needs:   . Lack of Transportation (Medical): Not on file  . Lack of Transportation (Non-Medical): Not on file  Physical Activity:   . Days of Exercise per Week: Not on file  . Minutes of Exercise per Session: Not on file  Stress:   . Feeling of Stress : Not on file  Social Connections:   . Frequency of Communication with Friends and Family: Not on file  . Frequency of Social Gatherings with Friends and Family: Not on file  . Attends Religious Services: Not on file  . Active Member of Clubs or Organizations: Not on file  . Attends Archivist Meetings: Not on file  .  Marital Status: Not on file  Intimate Partner Violence:   . Fear of Current or Ex-Partner: Not on file  . Emotionally Abused: Not on file  . Physically Abused: Not on file  . Sexually Abused: Not on file    Review of Systems: See HPI, otherwise negative ROS  Physical Exam: BP (!) 121/92   Pulse 88   Temp (!) 97.5 F (36.4 C) (Temporal)   Resp 18   Ht 6' (1.829 m)   Wt 102.4 kg   SpO2 99%   BMI 30.62 kg/m  General:   Alert,  pleasant and cooperative in NAD Head:  Normocephalic and atraumatic. Neck:  Supple; no masses or thyromegaly. Lungs:  Clear throughout to auscultation, normal respiratory effort.    Heart:  +S1, +S2, Regular rate and rhythm, No edema. Abdomen:  Soft, nontender and nondistended. Normal bowel sounds, without guarding, and without rebound.    Neurologic:  Alert and  oriented x4;  grossly normal neurologically.  Impression/Plan: [name] is here for an colonoscopy to be performed for Screening colonoscopy average risk   Risks, benefits, limitations, and alternatives regarding  colonoscopy have been reviewed with the patient.  Questions have been answered.  All parties agreeable.   Jonathon Bellows, MD  03/30/2019, 9:38 AM

## 2019-03-30 NOTE — Transfer of Care (Signed)
Immediate Anesthesia Transfer of Care Note  Patient: [place]  Procedure(s) Performed: COLONOSCOPY WITH BIOPSY (N/A Rectum) POLYPECTOMY (N/A Rectum)  Patient Location: PACU  Anesthesia Type: General  Level of Consciousness: awake, alert  and patient cooperative  Airway and Oxygen Therapy: Patient Spontanous Breathing and Patient connected to supplemental oxygen  Post-op Assessment: Post-op Vital signs reviewed, Patient's Cardiovascular Status Stable, Respiratory Function Stable, Patent Airway and No signs of Nausea or vomiting  Post-op Vital Signs: Reviewed and stable  Complications: No apparent anesthesia complications

## 2019-03-31 ENCOUNTER — Encounter: Payer: Self-pay | Admitting: *Deleted

## 2019-04-01 LAB — SURGICAL PATHOLOGY

## 2019-04-19 ENCOUNTER — Encounter: Payer: Self-pay | Admitting: Gastroenterology

## 2019-06-11 ENCOUNTER — Ambulatory Visit (INDEPENDENT_AMBULATORY_CARE_PROVIDER_SITE_OTHER): Payer: 59 | Admitting: Family Medicine

## 2019-06-11 ENCOUNTER — Encounter: Payer: Self-pay | Admitting: Family Medicine

## 2019-06-11 ENCOUNTER — Other Ambulatory Visit: Payer: Self-pay

## 2019-06-11 VITALS — BP 138/89 | HR 82 | Temp 97.1°F | Ht 71.5 in | Wt 232.8 lb

## 2019-06-11 DIAGNOSIS — E119 Type 2 diabetes mellitus without complications: Secondary | ICD-10-CM | POA: Diagnosis not present

## 2019-06-11 DIAGNOSIS — J310 Chronic rhinitis: Secondary | ICD-10-CM

## 2019-06-11 DIAGNOSIS — T485X5A Adverse effect of other anti-common-cold drugs, initial encounter: Secondary | ICD-10-CM

## 2019-06-11 LAB — BAYER DCA HB A1C WAIVED: HB A1C (BAYER DCA - WAIVED): 7 % — ABNORMAL HIGH (ref <7.0)

## 2019-06-11 MED ORDER — ALLOPURINOL 100 MG PO TABS: 200.0000 mg | ORAL_TABLET | Freq: Every day | ORAL | 1 refills | Status: DC

## 2019-06-11 MED ORDER — FLUTICASONE PROPIONATE 50 MCG/ACT NA SUSP: 2.0000 | Freq: Every day | NASAL | 6 refills | Status: DC

## 2019-06-11 MED ORDER — AMLODIPINE BESYLATE 10 MG PO TABS: 10.0000 mg | ORAL_TABLET | Freq: Every day | ORAL | 1 refills | Status: DC

## 2019-06-11 MED ORDER — LISINOPRIL 20 MG PO TABS: 20.0000 mg | ORAL_TABLET | Freq: Every day | ORAL | 1 refills | Status: DC

## 2019-06-11 MED ORDER — MONTELUKAST SODIUM 10 MG PO TABS: 10.0000 mg | ORAL_TABLET | Freq: Every day | ORAL | 3 refills | Status: DC

## 2019-06-11 MED ORDER — PREDNISONE 50 MG PO TABS: 50.0000 mg | ORAL_TABLET | Freq: Every day | ORAL | 0 refills | Status: DC

## 2019-06-11 NOTE — Assessment & Plan Note (Signed)
Going in the wrong direction with A1c of 7.0- would like to work on diet and exercise and recheck 3 months. Call with any concerns. Recheck 3 months with physical.

## 2019-06-11 NOTE — Progress Notes (Signed)
BP 138/89 (BP Location: Left Arm, Patient Position: Sitting, Cuff Size: Normal)   Pulse 82   Temp (!) 97.1 F (36.2 C) (Oral)   Ht 5' 11.5" (1.816 m)   Wt 232 lb 12.8 oz (105.6 kg)   SpO2 100%   BMI 32.02 kg/m    Subjective:    Patient ID: [place], male    DOB: 08/17/1965, 54 y.o.   MRN: [id number]  HPI: [place] is a 54 y.o. male  Chief Complaint  Patient presents with  . Diabetes  . Sinus Problem    pressure, headache. Nasal Spray helps.    DIABETES Hypoglycemic episodes:no Polydipsia/polyuria: no Visual disturbance: no Chest pain: no Paresthesias: no Glucose Monitoring: no  Accucheck frequency: Not Checking Taking Insulin?: no Blood Pressure Monitoring: not checking Retinal Examination: Up to Date Foot Exam: Up to Date Diabetic Education: Completed Pneumovax: Up to Date Influenza: Up to Date Aspirin: no  Allergies Duration: months Worst symptom: congestion, swelling eyes Fever: no Cough: no Shortness of breath: no Wheezing: no Chest pain: no Chest tightness: no Chest congestion: no Nasal congestion: yes Runny nose: yes Post nasal drip: yes Sneezing: no Sore throat: no Swollen glands: no Sinus pressure: yes Headache: yes Face pain: no Toothache: no Ear pain: no  Ear pressure: no  Eyes red/itching:no Eye drainage/crusting: no  Vomiting: no Rash: no Fatigue: no Sick contacts: no Strep contacts: no  Context: worse Recurrent sinusitis: no Relief with OTC cold/cough medications: no  Treatments attempted: afrin- taking daily  Relevant past medical, surgical, family and social history reviewed and updated as indicated. Interim medical history since our last visit reviewed. Allergies and medications reviewed and updated.  Review of Systems  Constitutional: Negative.   HENT: Positive for congestion, postnasal drip and sinus pressure. Negative for dental problem, drooling, ear discharge, ear pain, facial swelling, hearing loss, mouth  sores, nosebleeds, rhinorrhea, sinus pain, sneezing, sore throat, tinnitus, trouble swallowing and voice change.   Eyes: Negative.   Respiratory: Negative.   Cardiovascular: Negative.   Gastrointestinal: Negative.   Musculoskeletal: Negative.   Psychiatric/Behavioral: Negative.     Per HPI unless specifically indicated above     Objective:    BP 138/89 (BP Location: Left Arm, Patient Position: Sitting, Cuff Size: Normal)   Pulse 82   Temp (!) 97.1 F (36.2 C) (Oral)   Ht 5' 11.5" (1.816 m)   Wt 232 lb 12.8 oz (105.6 kg)   SpO2 100%   BMI 32.02 kg/m   Wt Readings from Last 3 Encounters:  06/11/19 232 lb 12.8 oz (105.6 kg)  03/30/19 225 lb 12.8 oz (102.4 kg)  03/12/19 227 lb (103 kg)    Physical Exam Vitals and nursing note reviewed.  Constitutional:      General: He is not in acute distress.    Appearance: Normal appearance. He is not ill-appearing, toxic-appearing or diaphoretic.  HENT:     Head: Normocephalic and atraumatic.     Right Ear: External ear normal.     Left Ear: External ear normal.     Nose: Nose normal.     Mouth/Throat:     Mouth: Mucous membranes are moist.     Pharynx: Oropharynx is clear.  Eyes:     General: No scleral icterus.       Right eye: No discharge.        Left eye: No discharge.     Extraocular Movements: Extraocular movements intact.     Conjunctiva/sclera: Conjunctivae normal.  Pupils: Pupils are equal, round, and reactive to light.  Cardiovascular:     Rate and Rhythm: Normal rate and regular rhythm.     Pulses: Normal pulses.     Heart sounds: Normal heart sounds. No murmur. No friction rub. No gallop.   Pulmonary:     Effort: Pulmonary effort is normal. No respiratory distress.     Breath sounds: Normal breath sounds. No stridor. No wheezing, rhonchi or rales.  Chest:     Chest wall: No tenderness.  Musculoskeletal:        General: Normal range of motion.     Cervical back: Normal range of motion and neck supple.   Skin:    General: Skin is warm and dry.     Capillary Refill: Capillary refill takes less than 2 seconds.     Coloration: Skin is not jaundiced or pale.     Findings: No bruising, erythema, lesion or rash.  Neurological:     General: No focal deficit present.     Mental Status: He is alert and oriented to person, place, and time. Mental status is at baseline.  Psychiatric:        Mood and Affect: Mood normal.        Behavior: Behavior normal.        Thought Content: Thought content normal.        Judgment: Judgment normal.     Results for orders placed or performed during the hospital encounter of 03/30/19  Surgical pathology  Result Value Ref Range   SURGICAL PATHOLOGY      SURGICAL PATHOLOGY CASE: [id number] PATIENT: [facility] Surgical Pathology Report     Specimen Submitted: A. Colon polyp x3, ascending; cold snare  Clinical History: Screening colonoscopy.  Colon polyps      DIAGNOSIS: A.  COLON POLYPS X3, ASCENDING; COLD SNARE: - TUBULAR ADENOMA (MULTIPLE FRAGMENTS). - NEGATIVE FOR HIGH-GRADE DYSPLASIA AND MALIGNANCY.  GROSS DESCRIPTION: A. Labeled: Ascending colon polyp x3 Received: In formalin Tissue fragment(s): Multiple Size: Aggregate, 1.0 x 0.9 x 0.1 cm Description: Tan soft tissue fragments Entirely submitted in 1 cassette.    Final Diagnosis performed by Quay Burow, MD.   Electronically signed 04/01/2019 9:29:38AM The electronic signature indicates that the named Attending Pathologist has evaluated the specimen Technical component performed at [facility], [address] [place], [place] [address] Lab: [phone] Dir: Rush Farmer, MD, MMM  Professional component performed at [facility], [facility], [place], [place], [place] [address] Lab: [phone] Dir: Dellia Nims. Rubinas, MD       Assessment & Plan:   Problem List Items Addressed This Visit      Endocrine   Diabetes mellitus without complication ([place]) -  Primary    Going in the wrong direction with A1c of 7.0- would like to work on diet and exercise and recheck 3 months. Call with any concerns. Recheck 3 months with physical.      Relevant Medications   lisinopril (ZESTRIL) 20 MG tablet   Other Relevant Orders   Bayer DCA Hb A1c Waived    Other Visit Diagnoses    Rhinitis medicamentosa       Will strop afrin and start prednisone to help with acute symptoms. Start flonase and singulair. Call with any concerns.        Follow up plan: Return in about 3 months (around 09/10/2019) for Physical.

## 2019-08-22 ENCOUNTER — Other Ambulatory Visit: Payer: Self-pay | Admitting: Family Medicine

## 2019-09-03 ENCOUNTER — Other Ambulatory Visit: Payer: Self-pay | Admitting: Family Medicine

## 2019-09-04 ENCOUNTER — Encounter: Payer: Self-pay | Admitting: Family Medicine

## 2019-10-02 ENCOUNTER — Encounter: Payer: 59 | Admitting: Family Medicine

## 2019-10-16 ENCOUNTER — Encounter: Payer: Self-pay | Admitting: Nurse Practitioner

## 2019-10-16 ENCOUNTER — Other Ambulatory Visit: Payer: Self-pay

## 2019-10-16 ENCOUNTER — Ambulatory Visit (INDEPENDENT_AMBULATORY_CARE_PROVIDER_SITE_OTHER): Payer: 59 | Admitting: Nurse Practitioner

## 2019-10-16 VITALS — BP 114/73 | HR 78 | Temp 98.5°F

## 2019-10-16 DIAGNOSIS — M5137 Other intervertebral disc degeneration, lumbosacral region: Secondary | ICD-10-CM | POA: Diagnosis not present

## 2019-10-16 MED ORDER — GABAPENTIN 300 MG PO CAPS: 300.0000 mg | ORAL_CAPSULE | Freq: Three times a day (TID) | ORAL | 3 refills | Status: DC

## 2019-10-16 MED ORDER — CYCLOBENZAPRINE HCL 10 MG PO TABS: 10.0000 mg | ORAL_TABLET | Freq: Three times a day (TID) | ORAL | 0 refills | Status: DC | PRN

## 2019-10-16 NOTE — Assessment & Plan Note (Signed)
Current acute flare of chronic, underlying back pain (no red flags) with MRI [facility] in 2015 (showed left central disc protrusion L5-S1 + small central disc protrusion L4-L5 + mild spinal stenosis L4-L5) and epidural injections in past.  Was pain free after injections for 2-3 years.   Would like to return to for evaluation of repeat injections, lives in [place], referral to [facility] pain management placed.  Start Gabapentin 300 MG TID, discussed and educated patient on this, may take with Tylenol if needed.  Script for Gabapentin and Flexeril sent.  Recommend gentle stretching daily and applying heat/ice as needed.  Provided work note for today and Monday.  Return to office for worsening or ongoing pain.

## 2019-10-16 NOTE — Patient Instructions (Signed)
Acute Back Pain, Adult Acute back pain is sudden and usually short-lived. It is often caused by an injury to the muscles and tissues in the back. The injury may result from:  A muscle or ligament getting overstretched or torn (strained). Ligaments are tissues that connect bones to each other. Lifting something improperly can cause a back strain.  Wear and tear (degeneration) of the spinal disks. Spinal disks are circular tissue that provides cushioning between the bones of the spine (vertebrae).  Twisting motions, such as while playing sports or doing yard work.  A hit to the back.  Arthritis. You may have a physical exam, lab tests, and imaging tests to find the cause of your pain. Acute back pain usually goes away with rest and home care. Follow these instructions at home: Managing pain, stiffness, and swelling  Take over-the-counter and prescription medicines only as told by your health care provider.  Your health care provider may recommend applying ice during the first 24-48 hours after your pain starts. To do this: ? Put ice in a plastic bag. ? Place a towel between your skin and the bag. ? Leave the ice on for 20 minutes, 2-3 times a day.  If directed, apply heat to the affected area as often as told by your health care provider. Use the heat source that your health care provider recommends, such as a moist heat pack or a heating pad. ? Place a towel between your skin and the heat source. ? Leave the heat on for 20-30 minutes. ? Remove the heat if your skin turns bright red. This is especially important if you are unable to feel pain, heat, or cold. You have a greater risk of getting burned. Activity   Do not stay in bed. Staying in bed for more than 1-2 days can delay your recovery.  Sit up and stand up straight. Avoid leaning forward when you sit, or hunching over when you stand. ? If you work at a desk, sit close to it so you do not need to lean over. Keep your chin tucked  in. Keep your neck drawn back, and keep your elbows bent at a right angle. Your arms should look like the letter "L." ? Sit high and close to the steering wheel when you drive. Add lower back (lumbar) support to your car seat, if needed.  Take short walks on even surfaces as soon as you are able. Try to increase the length of time you walk each day.  Do not sit, drive, or stand in one place for more than 30 minutes at a time. Sitting or standing for long periods of time can put stress on your back.  Do not drive or use heavy machinery while taking prescription pain medicine.  Use proper lifting techniques. When you bend and lift, use positions that put less stress on your back: ? Bend your knees. ? Keep the load close to your body. ? Avoid twisting.  Exercise regularly as told by your health care provider. Exercising helps your back heal faster and helps prevent back injuries by keeping muscles strong and flexible.  Work with a physical therapist to make a safe exercise program, as recommended by your health care provider. Do any exercises as told by your physical therapist. Lifestyle  Maintain a healthy weight. Extra weight puts stress on your back and makes it difficult to have good posture.  Avoid activities or situations that make you feel anxious or stressed. Stress and anxiety increase muscle   tension and can make back pain worse. Learn ways to manage anxiety and stress, such as through exercise. General instructions  Sleep on a firm mattress in a comfortable position. Try lying on your side with your knees slightly bent. If you lie on your back, put a pillow under your knees.  Follow your treatment plan as told by your health care provider. This may include: ? Cognitive or behavioral therapy. ? Acupuncture or massage therapy. ? Meditation or yoga. Contact a health care provider if:  You have pain that is not relieved with rest or medicine.  You have increasing pain going down  into your legs or buttocks.  Your pain does not improve after 2 weeks.  You have pain at night.  You lose weight without trying.  You have a fever or chills. Get help right away if:  You develop new bowel or bladder control problems.  You have unusual weakness or numbness in your arms or legs.  You develop nausea or vomiting.  You develop abdominal pain.  You feel faint. Summary  Acute back pain is sudden and usually short-lived.  Use proper lifting techniques. When you bend and lift, use positions that put less stress on your back.  Take over-the-counter and prescription medicines and apply heat or ice as directed by your health care provider. This information is not intended to replace advice given to you by your health care provider. Make sure you discuss any questions you have with your health care provider. Document Revised: 06/03/2018 Document Reviewed: 09/26/2016 Elsevier Patient Education  2020 [employer] Inc[employer]  

## 2019-10-16 NOTE — Progress Notes (Signed)
BP 114/73   Pulse 78   Temp 98.5 F (36.9 C) (Oral)   SpO2 98%    Subjective:    Patient ID: [place], male    DOB: 03-22-1965, 54 y.o.   MRN: [id number]  HPI: [place] is a 54 y.o. male  Chief Complaint  Patient presents with  . Back Pain    pt states he has been having low back pain for a while. States the pain runs down into his left leg, no pain in right leg per patient. States the pain is no impacting his job. States he is uncomfortable standing up and sitting for long periods of time. States he had to have epidural shots in the past that helped but have now worn off    BACK PAIN Reports low back pain for awhile, started again about 1 1/2 weeks ago.  States this feels exactly like last time he had back pain -- has had MRI [facility] in 2015 (showed left central disc protrusion L5-S1 + small central disc protrusion L4-L5 + mild spinal stenosis L4-L5) and epidural injections in past.  Was pain free after injections for 2-3 years.  Pain goes to low back and radiates down left leg, if stands for long periods of time has to bend over to take pressure off of it.  Pain is currently affecting his job, which it did last time as well -- does lots of pushing, lifting, pulling at job (5 gallon buckets). Duration: weeks Mechanism of injury: lifting Location: Left and low back Onset: sudden Severity: 10/10 Quality: sharp, dull, aching and shooting Frequency: varies, at times constant and other times intermittent Radiation: L leg above knee Aggravating factors: lifting, movement and bending Alleviating factors: leaning forward, sitting down, resting Status: worse Treatments attempted: rest and aleve  Relief with NSAIDs?: no Nighttime pain:  when tosses and turns only Paresthesias / decreased sensation:  no Bowel / bladder incontinence:  no Fevers:  no Dysuria / urinary frequency:  no  Relevant past medical, surgical, family and social history reviewed and updated as indicated.  Interim medical history since our last visit reviewed. Allergies and medications reviewed and updated.  Review of Systems  Constitutional: Negative for activity change, diaphoresis, fatigue and fever.  Respiratory: Negative for cough, chest tightness, shortness of breath and wheezing.   Cardiovascular: Negative for chest pain, palpitations and leg swelling.  Musculoskeletal: Positive for back pain.  Neurological: Negative.   Psychiatric/Behavioral: Negative.     Per HPI unless specifically indicated above     Objective:    BP 114/73   Pulse 78   Temp 98.5 F (36.9 C) (Oral)   SpO2 98%   Wt Readings from Last 3 Encounters:  06/11/19 232 lb 12.8 oz (105.6 kg)  03/30/19 225 lb 12.8 oz (102.4 kg)  03/12/19 227 lb (103 kg)    Physical Exam Vitals and nursing note reviewed.  Constitutional:      General: He is awake. He is not in acute distress.    Appearance: He is well-developed, well-groomed and overweight. He is not ill-appearing.  HENT:     Head: Normocephalic and atraumatic.     Right Ear: Hearing normal. No drainage.     Left Ear: Hearing normal. No drainage.  Eyes:     General: Lids are normal.        Right eye: No discharge.        Left eye: No discharge.     Conjunctiva/sclera: Conjunctivae normal.  Pupils: Pupils are equal, round, and reactive to light.  Neck:     Trachea: Trachea normal.  Cardiovascular:     Rate and Rhythm: Normal rate and regular rhythm.     Heart sounds: Normal heart sounds, S1 normal and S2 normal. No murmur heard.  No gallop.   Pulmonary:     Effort: Pulmonary effort is normal. No accessory muscle usage or respiratory distress.     Breath sounds: Normal breath sounds.  Abdominal:     General: Bowel sounds are normal.     Palpations: Abdomen is soft.  Musculoskeletal:     Cervical back: Normal range of motion and neck supple.     Lumbar back: No swelling, edema, signs of trauma, spasms or tenderness. Decreased range of motion.  Positive left straight leg raise test. Negative right straight leg raise test. No scoliosis.     Right lower leg: No edema.     Left lower leg: No edema.     Comments: Decreased ROM and pain reported with extension.  Pain reported with lateral bend left and lateral rotation left.  Sensation intact.  No rashes noted.  Skin:    General: Skin is warm and dry.     Capillary Refill: Capillary refill takes less than 2 seconds.     Findings: No rash.  Neurological:     Mental Status: He is alert and oriented to person, place, and time.     Deep Tendon Reflexes: Reflexes are normal and symmetric.  Psychiatric:        Attention and Perception: Attention normal.        Mood and Affect: Mood normal.        Speech: Speech normal.        Behavior: Behavior normal. Behavior is cooperative.        Thought Content: Thought content normal.     Results for orders placed or performed in visit on 06/11/19  Bayer DCA Hb A1c Waived  Result Value Ref Range   HB A1C (BAYER DCA - WAIVED) 7.0 (H) <7.0 %      Assessment & Plan:   Problem List Items Addressed This Visit      Musculoskeletal and Integument   DDD (degenerative disc disease), lumbosacral - Primary    Current acute flare of chronic, underlying back pain (no red flags) with MRI [facility] in 2015 (showed left central disc protrusion L5-S1 + small central disc protrusion L4-L5 + mild spinal stenosis L4-L5) and epidural injections in past.  Was pain free after injections for 2-3 years.   Would like to return to for evaluation of repeat injections, lives in [place], referral to [facility] management placed.  Start Gabapentin 300 MG TID, discussed and educated patient on this, may take with Tylenol if needed.  Script for Gabapentin and Flexeril sent.  Recommend gentle stretching daily and applying heat/ice as needed.  Provided work note for today and Monday.  Return to office for worsening or ongoing pain.      Relevant Medications   cyclobenzaprine  (FLEXERIL) 10 MG tablet   Other Relevant Orders   Ambulatory referral to Pain Clinic       Follow up plan: Return if symptoms worsen or fail to improve.

## 2019-10-20 ENCOUNTER — Telehealth: Payer: Self-pay | Admitting: Family Medicine

## 2019-10-20 ENCOUNTER — Encounter: Payer: Self-pay | Admitting: Family Medicine

## 2019-10-20 ENCOUNTER — Ambulatory Visit: Payer: Self-pay | Admitting: *Deleted

## 2019-10-20 ENCOUNTER — Other Ambulatory Visit: Payer: Self-pay

## 2019-10-20 ENCOUNTER — Emergency Department
Admission: EM | Admit: 2019-10-20 | Discharge: 2019-10-20 | Disposition: A | Discharge status: Home / Self Care | Payer: 59 | Attending: Emergency Medicine | Admitting: Emergency Medicine

## 2019-10-20 DIAGNOSIS — T7840XA Allergy, unspecified, initial encounter: Secondary | ICD-10-CM | POA: Diagnosis present

## 2019-10-20 DIAGNOSIS — F17210 Nicotine dependence, cigarettes, uncomplicated: Secondary | ICD-10-CM | POA: Insufficient documentation

## 2019-10-20 DIAGNOSIS — Z79899 Other long term (current) drug therapy: Secondary | ICD-10-CM | POA: Insufficient documentation

## 2019-10-20 DIAGNOSIS — Z7982 Long term (current) use of aspirin: Secondary | ICD-10-CM | POA: Diagnosis not present

## 2019-10-20 DIAGNOSIS — I10 Essential (primary) hypertension: Secondary | ICD-10-CM | POA: Diagnosis not present

## 2019-10-20 DIAGNOSIS — E119 Type 2 diabetes mellitus without complications: Secondary | ICD-10-CM | POA: Insufficient documentation

## 2019-10-20 DIAGNOSIS — K1120 Sialoadenitis, unspecified: Secondary | ICD-10-CM | POA: Diagnosis not present

## 2019-10-20 MED ORDER — PREDNISONE 10 MG PO TABS: 10.0000 mg | ORAL_TABLET | Freq: Every day | ORAL | 0 refills | Status: DC

## 2019-10-20 MED ORDER — DIPHENHYDRAMINE HCL 50 MG/ML IJ SOLN
50.0000 mg | Freq: Once | INTRAMUSCULAR | Status: AC
  Administered 2019-10-20: 50 mg via INTRAMUSCULAR
  Filled 2019-10-20: qty 1

## 2019-10-20 MED ORDER — AMOXICILLIN-POT CLAVULANATE 875-125 MG PO TABS
1.0000 | ORAL_TABLET | Freq: Once | ORAL | Status: AC
  Administered 2019-10-20: 1 via ORAL
  Filled 2019-10-20: qty 1

## 2019-10-20 MED ORDER — AMOXICILLIN-POT CLAVULANATE 875-125 MG PO TABS: 1.0000 | ORAL_TABLET | Freq: Two times a day (BID) | ORAL | 0 refills | Status: DC

## 2019-10-20 MED ORDER — METHOCARBAMOL 500 MG PO TABS: 500.0000 mg | ORAL_TABLET | Freq: Four times a day (QID) | ORAL | 0 refills | Status: DC

## 2019-10-20 MED ORDER — METHYLPREDNISOLONE SODIUM SUCC 125 MG IJ SOLR
125.0000 mg | Freq: Once | INTRAMUSCULAR | Status: AC
  Administered 2019-10-20: 125 mg via INTRAMUSCULAR
  Filled 2019-10-20: qty 2

## 2019-10-20 MED ORDER — FAMOTIDINE 20 MG PO TABS
20.0000 mg | ORAL_TABLET | Freq: Once | ORAL | Status: AC
  Administered 2019-10-20: 20 mg via ORAL
  Filled 2019-10-20: qty 1

## 2019-10-20 NOTE — Telephone Encounter (Signed)
Patient is calling to report he is having reaction to Gabapentin. Patient states his lymph nodes are swollen in his throat and hs is feeling soreness and difficultly swallowing- keeps clearing throat. Feet have begun to swell. Call to office- they request send note- advise patient if he should get any worse go to ED. Reason for Disposition . [1] Caller has URGENT medicine question about med that PCP or specialist prescribed AND [2] triager unable to answer question  Answer Assessment - Initial Assessment Questions 1. NAME of MEDICATION: "What medicine are you calling about?"     Gabapentin  2. QUESTION: "What is your question?" (e.g., medication refill, side effect)     Prescribed Gabapentin for back pain- started Friday- by Sunday patient had "lump" in throat- difficult to swallow and today he feels swelling in neck.  3. PRESCRIBING HCP: "Who prescribed it?" Reason: if prescribed by specialist, call should be referred to that group.     PCP 4. SYMPTOMS: "Do you have any symptoms?"     Lymph nodes swollen and feet and ankles swollen Last dose this morning  Protocols used: MEDICATION QUESTION CALL-A-AH

## 2019-10-20 NOTE — Telephone Encounter (Signed)
Letter printed and placed up front for patient. Called and let him know that it was ready to be picked up.

## 2019-10-20 NOTE — ED Notes (Signed)
No reaction noted to injection site.

## 2019-10-20 NOTE — Telephone Encounter (Signed)
If his throat is swelling, yes, he should go immediately to the ER

## 2019-10-20 NOTE — Telephone Encounter (Signed)
Letter picked up.

## 2019-10-20 NOTE — Telephone Encounter (Signed)
Called and spoke with patient. Advised patient of Dr. Durenda Age instructions and patient verbalized understanding.  Patient wants to know if he can have a work note for today.

## 2019-10-20 NOTE — Telephone Encounter (Signed)
Of course. I'll write one out.

## 2019-10-20 NOTE — ED Provider Notes (Signed)
[facility] Emergency Department Provider Note  ____________________________________________  Time seen: Approximately 5:23 PM  I have reviewed the triage vital signs and the nursing notes.   HISTORY  Chief Complaint Allergic Reaction    HPI Chase Hull is a 54 y.o. male who presents the emergency department and possible reaction to the medications he started to take.  Patient has a remote history of back problems, has received epidural injections in his back patient states that recently his back pain flared, had left-sided sciatica he was started on gabapentin and Flexeril for same.  Patient taken 2 days of medication when he started to notice a painful lesion in the left submandibular region.  Patient states that there was no difficulty breathing.  Pain with swallowing but no difficulty swallowing.  A day later he noticed swelling, pruritus of his lower extremities.  No rash.  No cough, wheezing, shortness of breath.  No swelling of the lips or tongue.  Patient stopped Flexeril and gabapentin but is not take any medications prior to arrival.         Past Medical History:  Diagnosis Date  . Carpal tunnel syndrome   . Diabetes mellitus without complication (HCC)    no meds, A1C down  . Gout   . Hypertension     Patient Active Problem List   Diagnosis Date Noted  . Bilateral carpal tunnel syndrome 01/28/2018  . Hypertension   . Diabetes mellitus without complication ([place])   . 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 DDD (degenerative disc disease), lumbosacral 12/15/2013  . Obesity (BMI 30-39.9) 04/08/2012  . Hyperlipidemia 10/20/2010    Past Surgical History:  Procedure Laterality Date  . COLONOSCOPY WITH PROPOFOL N/A 03/30/2019   Procedure: COLONOSCOPY WITH BIOPSY;  Surgeon: Jonathon Bellows, MD;  Location: [place];  Service: Endoscopy;  Laterality: N/A;  Priority 4  . Left hand surgery  03/2018   Carpal tunnel  .  POLYPECTOMY N/A 03/30/2019   Procedure: POLYPECTOMY;  Surgeon: Jonathon Bellows, MD;  Location: [place];  Service: Endoscopy;  Laterality: N/A;    Prior to Admission medications   Medication Sig Start Date End Date Taking? Authorizing Provider  allopurinol (ZYLOPRIM) 100 MG tablet TAKE 2 TABLETS BY MOUTH EVERY DAY 09/03/19   Johnson, Megan P, DO  amLODipine (NORVASC) 10 MG tablet TAKE 1 TABLET BY MOUTH EVERY DAY 08/22/19   Wynetta Emery, Megan P, DO  amoxicillin-clavulanate (AUGMENTIN) 875-125 MG tablet Take 1 tablet by mouth 2 (two) times daily. 10/20/19   Heaton Sarin, Charline Bills, PA-C  aspirin 81 MG tablet Take by mouth.    [provider]  Blood Glucose Monitoring Suppl (FIFTY50 GLUCOSE METER 2.0) w/Device KIT Use to check fasting blood sugars daily 12/15/13   [provider]  cyclobenzaprine (FLEXERIL) 10 MG tablet Take 1 tablet (10 mg total) by mouth 3 (three) times daily as needed for muscle spasms. 10/16/19   Cannady, Henrine Screws T, NP  fluticasone (FLONASE) 50 MCG/ACT nasal spray Place 2 sprays into both nostrils daily. 06/11/19   Johnson, Megan P, DO  gabapentin (NEURONTIN) 300 MG capsule Take 1 capsule (300 mg total) by mouth 3 (three) times daily. 10/16/19   Cannady, Henrine Screws T, NP  lisinopril (ZESTRIL) 20 MG tablet TAKE 1 TABLET BY MOUTH EVERY DAY 08/22/19   Johnson, Megan P, DO  methocarbamol (ROBAXIN) 500 MG tablet Take 1 tablet (500 mg total) by mouth 4 (four) times daily. 10/20/19   Nyeem Stoke, Charline Bills, PA-C  montelukast (SINGULAIR) 10 MG tablet Take 1 tablet (10 mg total) by mouth at bedtime. 06/11/19   Johnson, Megan P, DO  Oxymetazoline HCl (AFRIN NASAL SPRAY NA) Place into the nose.    [provider]  predniSONE (DELTASONE) 10 MG tablet Take 1 tablet (10 mg total) by mouth daily. 10/20/19   Dorthie Santini, Charline Bills, PA-C    Allergies Patient has no known allergies.  No family history on file.  Social History Social History   Tobacco Use  . Smoking status:  Current Every Day Smoker    Packs/day: 0.50    Years: 25.00    Pack years: 12.50    Types: Cigarettes  . Smokeless tobacco: Never Used  Vaping Use  . Vaping Use: Never used  Substance Use Topics  . Alcohol use: Not Currently  . Drug use: Never     Review of Systems  Constitutional: No fever/chills Eyes: No visual changes. No discharge ENT: No upper respiratory complaints. Cardiovascular: no chest pain. Respiratory: no cough. No SOB. Gastrointestinal: No abdominal pain.  No nausea, no vomiting.  No diarrhea.  No constipation. Musculoskeletal: Negative for musculoskeletal pain. Skin: Negative for rash, abrasions, lacerations, ecchymosis. Neurological: Negative for headaches, focal weakness or numbness. 10-point ROS otherwise negative.  ____________________________________________   PHYSICAL EXAM:  VITAL SIGNS: ED Triage Vitals  Enc Vitals Group     BP 10/20/19 1646 (!) 136/102     Pulse Rate 10/20/19 1646 79     Resp 10/20/19 1646 20     Temp --      Temp src --      SpO2 10/20/19 1646 98 %     Weight 10/20/19 1413 225 lb (102.1 kg)     Height 10/20/19 1413 6' (1.829 m)     Head Circumference --      Peak Flow --      Pain Score 10/20/19 1413 5     Pain Loc --      Pain Edu? --      Excl. in [place]? --      Constitutional: Alert and oriented. Well appearing and in no acute distress. Eyes: Conjunctivae are normal. PERRL. EOMI. Head: Atraumatic.  Visualization of the right submandibular region reveals firm lesion at the inferior angle of the mandible.  Area slightly tender to palpation.  No overlying skin changes.  No lymphadenopathy.  No extension into the submandibular space crossing the midline.  No extension into the neck.  No concern for Ludwick's angina or Lemierre's. ENT:      Ears:       Nose: No congestion/rhinnorhea.      Mouth/Throat: Mucous membranes are moist.  No oropharyngeal erythema.  No lesions edema.  Uvula is midline. Neck: No stridor.  No  cervical spine tenderness to palpation. Hematological/Lymphatic/Immunilogical: No cervical lymphadenopathy. Cardiovascular: Normal rate, regular rhythm. Normal S1 and S2.  Good peripheral circulation. Respiratory: Normal respiratory effort without tachypnea or retractions. Lungs CTAB. Good air entry to the bases with no decreased or absent breath sounds. Gastrointestinal: Bowel sounds 4 quadrants. Soft and nontender to palpation. No guarding or rigidity. No palpable masses. No distention. No CVA tenderness. Musculoskeletal: Full range of motion to all extremities. No gross deformities appreciated. Neurologic:  Normal speech and language. No gross focal neurologic deficits are appreciated.  Skin:  Skin is warm, dry and intact. No rash noted.  Visualization of the bilateral lower extremities reveals shiny skin to the bilateral lower extremities from the knees down.  No hives,  wheals.  No other rashes identified.  Areas are not warm to palpation.  No fluctuance or induration.  No drainage.  Pulses and sensation intact bilateral lower extremity. Psychiatric: Mood and affect are normal. Speech and behavior are normal. Patient exhibits appropriate insight and judgement.   ____________________________________________   LABS (all labs ordered are listed, but only abnormal results are displayed)  Labs Reviewed - No data to display ____________________________________________  EKG   ____________________________________________  RADIOLOGY   No results found.  ____________________________________________    PROCEDURES  Procedure(s) performed:    Procedures    Medications  methylPREDNISolone sodium succinate (SOLU-MEDROL) 125 mg/2 mL injection 125 mg (has no administration in time range)  diphenhydrAMINE (BENADRYL) injection 50 mg (has no administration in time range)  famotidine (PEPCID) tablet 20 mg (has no administration in time range)  amoxicillin-clavulanate (AUGMENTIN) 875-125  MG per tablet 1 tablet (has no administration in time range)     ____________________________________________   INITIAL IMPRESSION / ASSESSMENT AND PLAN / ED COURSE  Pertinent labs & imaging results that were available during my care of the patient were reviewed by me and considered in my medical decision making (see chart for details).  Review of the [place] CSRS was performed in accordance of the [place] prior to dispensing any controlled drugs.           Patient's diagnosis is consistent with cellulitis, allergic reaction.  Patient presented to the emergency department with 2 complaints which she was concerned may be secondary to starting gabapentin and Flexeril.  Findings in the lower extremity are more consistent with allergic reaction.  Finding out in the right submandibular region is consistent with sial adenitis.  Patient will be given Solu-Medrol, diphenhydramine, famotidine for allergic reaction symptoms.  Patient be placed on an antibiotic for sialadenitis..  There was no angioedema.  Lungs are clear.  No emesis.  No decrease in blood pressure to suggest anaphylaxis.  Return precautions are discussed for both conditions.  Follow-up primary care as needed.  .  Patient is given ED precautions to return to the ED for any worsening or new symptoms.     ____________________________________________  FINAL CLINICAL IMPRESSION(S) / ED DIAGNOSES  Final diagnoses:  Allergic reaction, initial encounter  Sialadenitis      NEW MEDICATIONS STARTED DURING THIS VISIT:  ED Discharge Orders         Ordered    amoxicillin-clavulanate (AUGMENTIN) 875-125 MG tablet  2 times daily        10/20/19 1806    predniSONE (DELTASONE) 10 MG tablet  Daily       Note to Pharmacy: Take 6 pills x 2 days, 5 pills x 2 days, 4 pills x 2 days, 3 pills x 2 days, 2 pills x 2 days, and 1 pill x 2 days   10/20/19 1806    methocarbamol (ROBAXIN) 500 MG tablet  4 times daily        10/20/19 1806               This chart was dictated using voice recognition software/Dragon. Despite best efforts to proofread, errors can occur which can change the meaning. Any change was purely unintentional.    Darletta Moll, PA-C 10/20/19 1806    Lucrezia Starch, MD 10/20/19 1945

## 2019-10-20 NOTE — Telephone Encounter (Signed)
Copied from [place] [id number]. Topic: General - Inquiry >> Oct 20, 2019  2:45 PM Greggory Keen D wrote: Reason for CRM: Pt called from the ED stating that he was still there and his mom would be coming by to pick up his note from Dr. Wynetta Emery.

## 2019-10-20 NOTE — ED Triage Notes (Addendum)
Pt comes via POV from home with c/o swelling in bilateral extremities, sore throat and difficult to talk and swallow. Pt states he was recently prescribed medication for his back pain and believes he is having an reaction to that. Pt states it was either the flexeril or gabapentin.  Pt denies any SOb. Pt talking in complete sentences. Pt doesn't appear in distress.

## 2019-10-21 ENCOUNTER — Telehealth: Payer: Self-pay | Admitting: Family Medicine

## 2019-10-21 ENCOUNTER — Encounter: Payer: Self-pay | Admitting: Family Medicine

## 2019-10-21 ENCOUNTER — Ambulatory Visit (INDEPENDENT_AMBULATORY_CARE_PROVIDER_SITE_OTHER): Payer: 59 | Admitting: Family Medicine

## 2019-10-21 VITALS — BP 148/98 | HR 106 | Temp 99.0°F | Ht 71.65 in | Wt 241.2 lb

## 2019-10-21 DIAGNOSIS — Z1159 Encounter for screening for other viral diseases: Secondary | ICD-10-CM

## 2019-10-21 DIAGNOSIS — M1A0690 Idiopathic chronic gout, unspecified knee, without tophus (tophi): Secondary | ICD-10-CM

## 2019-10-21 DIAGNOSIS — M5137 Other intervertebral disc degeneration, lumbosacral region: Secondary | ICD-10-CM

## 2019-10-21 DIAGNOSIS — E782 Mixed hyperlipidemia: Secondary | ICD-10-CM | POA: Diagnosis not present

## 2019-10-21 DIAGNOSIS — Z0000 Encounter for general adult medical examination without abnormal findings: Secondary | ICD-10-CM

## 2019-10-21 DIAGNOSIS — E119 Type 2 diabetes mellitus without complications: Secondary | ICD-10-CM | POA: Diagnosis not present

## 2019-10-21 DIAGNOSIS — I10 Essential (primary) hypertension: Secondary | ICD-10-CM | POA: Diagnosis not present

## 2019-10-21 DIAGNOSIS — Z125 Encounter for screening for malignant neoplasm of prostate: Secondary | ICD-10-CM

## 2019-10-21 LAB — MICROSCOPIC EXAMINATION
Bacteria, UA: NONE SEEN
RBC, Urine: NONE SEEN /hpf (ref 0–2)
WBC, UA: NONE SEEN /hpf (ref 0–5)

## 2019-10-21 LAB — URINALYSIS, ROUTINE W REFLEX MICROSCOPIC
Bilirubin, UA: NEGATIVE
Leukocytes,UA: NEGATIVE
Nitrite, UA: NEGATIVE
RBC, UA: NEGATIVE
Specific Gravity, UA: 1.02 (ref 1.005–1.030)
Urobilinogen, Ur: 0.2 mg/dL (ref 0.2–1.0)
pH, UA: 6 (ref 5.0–7.5)

## 2019-10-21 LAB — MICROALBUMIN, URINE WAIVED
Creatinine, Urine Waived: 200 mg/dL (ref 10–300)
Microalb, Ur Waived: 150 mg/L — ABNORMAL HIGH (ref 0–19)

## 2019-10-21 LAB — BAYER DCA HB A1C WAIVED: HB A1C (BAYER DCA - WAIVED): 5.8 % (ref <7.0)

## 2019-10-21 MED ORDER — AMOXICILLIN-POT CLAVULANATE 875-125 MG PO TABS: 1.0000 | ORAL_TABLET | Freq: Two times a day (BID) | ORAL | 0 refills | Status: DC

## 2019-10-21 MED ORDER — METHOCARBAMOL 500 MG PO TABS: 500.0000 mg | ORAL_TABLET | Freq: Four times a day (QID) | ORAL | 1 refills | Status: DC | PRN

## 2019-10-21 MED ORDER — LISINOPRIL 20 MG PO TABS: 20.0000 mg | ORAL_TABLET | Freq: Every day | ORAL | 1 refills | Status: DC

## 2019-10-21 MED ORDER — MONTELUKAST SODIUM 10 MG PO TABS: 10.0000 mg | ORAL_TABLET | Freq: Every day | ORAL | 1 refills | Status: DC

## 2019-10-21 MED ORDER — AMLODIPINE BESYLATE 10 MG PO TABS: 10.0000 mg | ORAL_TABLET | Freq: Every day | ORAL | 1 refills | Status: DC

## 2019-10-21 MED ORDER — ALLOPURINOL 100 MG PO TABS: 200.0000 mg | ORAL_TABLET | Freq: Every day | ORAL | 0 refills | Status: DC

## 2019-10-21 NOTE — Progress Notes (Signed)
BP (!) 148/98 (BP Location: Left Arm, Cuff Size: Normal)   Pulse (!) 106   Temp 99 F (37.2 C) (Oral)   Ht 5' 11.65" (1.82 m)   Wt 241 lb 3.2 oz (109.4 kg)   SpO2 100%   BMI 33.03 kg/m    Subjective:    Patient ID: Chase Hull, male    DOB: September 26, 1965, 54 y.o.   MRN: [id number]  HPI: Chase Hull is a 54 y.o. male presenting on 10/21/2019 for comprehensive medical examination. Current medical complaints include:  No gout flares, tolerating allopurinol well  HYPERTENSION / HYPERLIPIDEMIA Satisfied with current treatment? yes Duration of hypertension: chronic BP monitoring frequency: not checking BP medication side effects: no Past BP meds: lisinopril, amlodpine Duration of hyperlipidemia: chronic Cholesterol medication side effects: not on anything Cholesterol supplements: none Past cholesterol medications:  Medication compliance: excellent compliance Aspirin: no Recent stressors: no Recurrent headaches: no Visual changes: no Palpitations: no Dyspnea: no Chest pain: no Lower extremity edema: yes Dizzy/lightheaded: no  DIABETES Hypoglycemic episodes:yes Polydipsia/polyuria: no Visual disturbance: no Chest pain: no Paresthesias: no Glucose Monitoring: yes  Accucheck frequency: Daily  Fasting glucose: 95-100 Taking Insulin?: no Blood Pressure Monitoring: not checking Retinal Examination: Not up to Date Foot Exam: Up to Date Diabetic Education: Completed Pneumovax: Up to Date Influenza: not in stock Aspirin: no  Interim Problems from his last visit: yes  ER visit yesterday with sianladentitis and allergic reaction with swelling legs to gabapentin.   BACK PAIN Duration: weeks Mechanism of injury: lifting Location: low back Onset: sudden Severity: severe Quality: sharp, aching and sore Frequency: constant Radiation: L leg above the knee Aggravating factors: lifting, moving, bending Alleviating factors: leaning forward, sitting down, resting Status:  worse Treatments attempted:muscle relaxer, gabapentin, steroid   Relief with NSAIDs?: no Nighttime pain:  no Paresthesias / decreased sensation:  no Bowel / bladder incontinence:  no Fevers:  no Dysuria / urinary frequency:  no   Depression Screen done today and results listed below:  Depression screen [facility] 2/9 03/12/2019 12/20/2017  Decreased Interest 0 0  Down, Depressed, Hopeless 0 0  PHQ - 2 Score 0 0  Altered sleeping - 0  Tired, decreased energy - 0  Change in appetite - 0  Feeling bad or failure about yourself  - 0  Trouble concentrating - 0  Moving slowly or fidgety/restless - 0  Suicidal thoughts - 0  PHQ-9 Score - 0  Difficult doing work/chores - Not difficult at all    Past Medical History:  Past Medical History:  Diagnosis Date  . Carpal tunnel syndrome   . Diabetes mellitus without complication (HCC)    no meds, A1C down  . Gout   . Hypertension     Surgical History:  Past Surgical History:  Procedure Laterality Date  . COLONOSCOPY WITH PROPOFOL N/A 03/30/2019   Procedure: COLONOSCOPY WITH BIOPSY;  Surgeon: Jonathon Bellows, MD;  Location: [facility];  Service: Endoscopy;  Laterality: N/A;  Priority 4  . Left hand surgery  03/2018   Carpal tunnel  . POLYPECTOMY N/A 03/30/2019   Procedure: POLYPECTOMY;  Surgeon: Jonathon Bellows, MD;  Location: [facility];  Service: Endoscopy;  Laterality: N/A;    Medications:  Current Outpatient Medications on File Prior to Visit  Medication Sig  . aspirin 81 MG tablet Take by mouth.  . Blood Glucose Monitoring Suppl (FIFTY50 GLUCOSE METER 2.0) w/Device KIT Use to check fasting blood sugars daily  . fluticasone (FLONASE) 50 MCG/ACT nasal spray  Place 2 sprays into both nostrils daily. (Patient not taking: Reported on 10/21/2019)  . Oxymetazoline HCl (AFRIN NASAL SPRAY NA) Place into the nose. (Patient not taking: Reported on 10/21/2019)  . predniSONE (DELTASONE) 10 MG tablet Take 1 tablet (10 mg total) by mouth daily.  (Patient not taking: Reported on 10/21/2019)   No current facility-administered medications on file prior to visit.    Allergies:  Allergies  Allergen Reactions  . Gabapentin Swelling    Social History:  Social History   Socioeconomic History  . Marital status: Single    Spouse name: Not on file  . Number of children: Not on file  . Years of education: Not on file  . Highest education level: Not on file  Occupational History  . Not on file  Tobacco Use  . Smoking status: Current Every Day Smoker    Packs/day: 0.50    Years: 25.00    Pack years: 12.50    Types: Cigarettes  . Smokeless tobacco: Never Used  Vaping Use  . Vaping Use: Never used  Substance and Sexual Activity  . Alcohol use: Not Currently  . Drug use: Never  . Sexual activity: Not Currently  Other Topics Concern  . Not on file  Social History Narrative  . Not on file   Social Determinants of Health   Financial Resource Strain:   . Difficulty of Paying Living Expenses: Not on file  Food Insecurity:   . Worried About Charity fundraiser in the Last Year: Not on file  . Ran Out of Food in the Last Year: Not on file  Transportation Needs:   . Lack of Transportation (Medical): Not on file  . Lack of Transportation (Non-Medical): Not on file  Physical Activity:   . Days of Exercise per Week: Not on file  . Minutes of Exercise per Session: Not on file  Stress:   . Feeling of Stress : Not on file  Social Connections:   . Frequency of Communication with Friends and Family: Not on file  . Frequency of Social Gatherings with Friends and Family: Not on file  . Attends Religious Services: Not on file  . Active Member of Clubs or Organizations: Not on file  . Attends Archivist Meetings: Not on file  . Marital Status: Not on file  Intimate Partner Violence:   . Fear of Current or Ex-Partner: Not on file  . Emotionally Abused: Not on file  . Physically Abused: Not on file  . Sexually Abused: Not  on file   Social History   Tobacco Use  Smoking Status Current Every Day Smoker  . Packs/day: 0.50  . Years: 25.00  . Pack years: 12.50  . Types: Cigarettes  Smokeless Tobacco Never Used   Social History   Substance and Sexual Activity  Alcohol Use Not Currently    Family History:  History reviewed. No pertinent family history.  Past medical history, surgical history, medications, allergies, family history and social history reviewed with patient today and changes made to appropriate areas of the chart.   Review of Systems  Constitutional: Negative.   HENT: Negative.   Eyes: Negative.   Respiratory: Positive for shortness of breath. Negative for cough, hemoptysis, sputum production and wheezing.   Cardiovascular: Positive for leg swelling. Negative for chest pain, palpitations, orthopnea, claudication and PND.  Gastrointestinal: Negative.   Genitourinary: Negative.   Musculoskeletal: Negative.   Skin: Negative.   Neurological: Negative.   Endo/Heme/Allergies: Positive for environmental allergies.  Negative for polydipsia. Does not bruise/bleed easily.  Psychiatric/Behavioral: Negative.     All other ROS negative except what is listed above and in the HPI.      Objective:    BP (!) 148/98 (BP Location: Left Arm, Cuff Size: Normal)   Pulse (!) 106   Temp 99 F (37.2 C) (Oral)   Ht 5' 11.65" (1.82 m)   Wt 241 lb 3.2 oz (109.4 kg)   SpO2 100%   BMI 33.03 kg/m   Wt Readings from Last 3 Encounters:  10/21/19 241 lb 3.2 oz (109.4 kg)  10/20/19 225 lb (102.1 kg)  06/11/19 232 lb 12.8 oz (105.6 kg)    Physical Exam Vitals and nursing note reviewed.  Constitutional:      General: He is not in acute distress.    Appearance: Normal appearance. He is normal weight. He is not ill-appearing, toxic-appearing or diaphoretic.  HENT:     Head: Normocephalic and atraumatic.     Right Ear: Tympanic membrane, ear canal and external ear normal. There is no impacted cerumen.      Left Ear: Tympanic membrane, ear canal and external ear normal. There is no impacted cerumen.     Nose: Nose normal. No congestion or rhinorrhea.     Mouth/Throat:     Mouth: Mucous membranes are moist.     Pharynx: Oropharynx is clear. No oropharyngeal exudate or posterior oropharyngeal erythema.  Eyes:     General: No scleral icterus.       Right eye: No discharge.        Left eye: No discharge.     Extraocular Movements: Extraocular movements intact.     Conjunctiva/sclera: Conjunctivae normal.     Pupils: Pupils are equal, round, and reactive to light.  Neck:     Vascular: No carotid bruit.  Cardiovascular:     Rate and Rhythm: Normal rate and regular rhythm.     Pulses: Normal pulses.     Heart sounds: No murmur heard.  No friction rub. No gallop.   Pulmonary:     Effort: Pulmonary effort is normal. No respiratory distress.     Breath sounds: Normal breath sounds. No stridor. No wheezing, rhonchi or rales.  Chest:     Chest wall: No tenderness.  Abdominal:     General: Abdomen is flat. Bowel sounds are normal. There is no distension.     Palpations: Abdomen is soft. There is no mass.     Tenderness: There is no abdominal tenderness. There is no right CVA tenderness, left CVA tenderness, guarding or rebound.     Hernia: No hernia is present.  Genitourinary:    Comments: Genital exam deferred with shared decision making Musculoskeletal:        General: No swelling, tenderness, deformity or signs of injury. Normal range of motion.     Cervical back: Normal range of motion and neck supple. No rigidity. No muscular tenderness.     Right lower leg: No edema.     Left lower leg: No edema.  Lymphadenopathy:     Cervical: No cervical adenopathy.  Skin:    General: Skin is warm and dry.     Capillary Refill: Capillary refill takes less than 2 seconds.     Coloration: Skin is not jaundiced or pale.     Findings: No bruising, erythema, lesion or rash.  Neurological:      General: No focal deficit present.     Mental Status: He is alert and  oriented to person, place, and time.     Cranial Nerves: No cranial nerve deficit.     Sensory: No sensory deficit.     Motor: No weakness.     Coordination: Coordination normal.     Gait: Gait normal.     Deep Tendon Reflexes: Reflexes normal.  Psychiatric:        Mood and Affect: Mood normal.        Behavior: Behavior normal.        Thought Content: Thought content normal.        Judgment: Judgment normal.     Results for orders placed or performed in visit on 10/21/19  Microscopic Examination   Urine  Result Value Ref Range   WBC, UA None seen 0 - 5 /hpf   RBC None seen 0 - 2 /hpf   Epithelial Cells (non renal) 0-10 0 - 10 /hpf   Bacteria, UA None seen None seen/Few  Bayer DCA Hb A1c Waived  Result Value Ref Range   HB A1C (BAYER DCA - WAIVED) 5.8 <7.0 %  Microalbumin, Urine Waived  Result Value Ref Range   Microalb, Ur Waived 150 (H) 0 - 19 mg/L   Creatinine, Urine Waived 200 10 - 300 mg/dL   Microalb/Creat Ratio 30-300 (H) <30 mg/g  Urinalysis, Routine w reflex microscopic  Result Value Ref Range   Specific Gravity, UA 1.020 1.005 - 1.030   pH, UA 6.0 5.0 - 7.5   Color, UA Yellow Yellow   Appearance Ur Clear Clear   Leukocytes,UA Negative Negative   Protein,UA 2+ (A) Negative/Trace   Glucose, UA 3+ (A) Negative   Ketones, UA Trace (A) Negative   RBC, UA Negative Negative   Bilirubin, UA Negative Negative   Urobilinogen, Ur 0.2 0.2 - 1.0 mg/dL   Nitrite, UA Negative Negative   Microscopic Examination See below:       Assessment & Plan:   Problem List Items Addressed This Visit      Cardiovascular and Mediastinum   Hypertension    Running a little high today. Likely due to pain and steroid yesterday. Will continue current regimen. Continue to monitor. Call with any concerns.       Relevant Medications   amLODipine (NORVASC) 10 MG tablet   lisinopril (ZESTRIL) 20 MG tablet   Other  Relevant Orders   CBC with Differential/Platelet   Comprehensive metabolic panel   Microalbumin, Urine Waived (Completed)   TSH     Endocrine   Diabetes mellitus without complication ([place])    Under great control with A1c of 5.8. Continue diet and exercise. Continue to monitor. Call with any concerns.       Relevant Medications   lisinopril (ZESTRIL) 20 MG tablet   Other Relevant Orders   Bayer DCA Hb A1c Waived (Completed)   CBC with Differential/Platelet   Comprehensive metabolic panel   Microalbumin, Urine Waived (Completed)   Urinalysis, Routine w reflex microscopic (Completed)     Musculoskeletal and Integument   DDD (degenerative disc disease), lumbosacral    In exacerbation. Will get him into ortho and will start methocarbamol. Continue to monitor. Call with any concerns.       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 Continue current regimen. Continue to monitor.  Call with any concerns. Refills given. Labs drawn today.       Relevant Medications   allopurinol (ZYLOPRIM) 100 MG tablet   methocarbamol (ROBAXIN) 500 MG tablet   Other Relevant Orders   CBC with Differential/Platelet   Comprehensive metabolic panel   Uric acid     Other   Hyperlipidemia    Rechecking levels today. Await results. Treat as needed.       Relevant Medications   amLODipine (NORVASC) 10 MG tablet   lisinopril (ZESTRIL) 20 MG tablet   Other Relevant Orders   CBC with Differential/Platelet   Comprehensive metabolic panel   Lipid Panel w/o Chol/HDL Ratio    Other Visit Diagnoses    Routine general medical examination at a health care facility    -  Primary   Screening for prostate cancer       Labs drawn today. Await results.    Relevant Orders   PSA   Need for  hepatitis C screening test       Labs drawn today. Await results.    Relevant Orders   Hepatitis C Antibody       Discussed aspirin prophylaxis for myocardial infarction prevention and decision was made to continue ASA  LABORATORY TESTING:  Health maintenance labs ordered today as discussed above.   The natural history of prostate cancer and ongoing controversy regarding screening and potential treatment outcomes of prostate cancer has been discussed with the patient. The meaning of a false positive PSA and a false negative PSA has been discussed. He indicates understanding of the limitations of this screening test and wishes to proceed with screening PSA testing.   IMMUNIZATIONS:   - Tdap: Tetanus vaccination status reviewed: last tetanus booster within 10 years. - Influenza: Postponed to flu season - Pneumovax: Up to date - Prevnar: Not applicable - COVID: Refused  SCREENING: - Colonoscopy: Up to date  Discussed with patient purpose of the colonoscopy is to detect colon cancer at curable precancerous or early stages   PATIENT COUNSELING:    Sexuality: Discussed sexually transmitted diseases, partner selection, use of condoms, avoidance of unintended pregnancy  and contraceptive alternatives.   Advised to avoid cigarette smoking.  I discussed with the patient that most people either abstain from alcohol or drink within safe limits (<=14/week and <=4 drinks/occasion for males, <=7/weeks and <= 3 drinks/occasion for females) and that the risk for alcohol disorders and other health effects rises proportionally with the number of drinks per week and how often a drinker exceeds daily limits.  Discussed cessation/primary prevention of drug use and availability of treatment for abuse.   Diet: Encouraged to adjust caloric intake to maintain  or achieve ideal body weight, to reduce intake of dietary saturated fat and total fat, to limit sodium intake by avoiding high sodium foods and not  adding table salt, and to maintain adequate dietary potassium and calcium preferably from fresh fruits, vegetables, and low-fat dairy products.    stressed the importance of regular exercise  Injury prevention: Discussed safety belts, safety helmets, smoke detector, smoking near bedding or upholstery.   Dental health: Discussed importance of regular tooth brushing, flossing, and dental visits.   Follow up plan: NEXT PREVENTATIVE PHYSICAL DUE IN 1 YEAR. No follow-ups on file.

## 2019-10-21 NOTE — Assessment & Plan Note (Signed)
Under good control on current regimen. Continue current regimen. Continue to monitor. Call with any concerns. Refills given. Labs drawn today.   

## 2019-10-21 NOTE — Assessment & Plan Note (Signed)
Under great control with A1c of 5.8. Continue diet and exercise. Continue to monitor. Call with any concerns.

## 2019-10-21 NOTE — Assessment & Plan Note (Signed)
In exacerbation. Will get him into ortho and will start methocarbamol. Continue to monitor. Call with any concerns.

## 2019-10-21 NOTE — Assessment & Plan Note (Signed)
Rechecking levels today. Await results. Treat as needed.  

## 2019-10-21 NOTE — Telephone Encounter (Signed)
Spoke with pt, [facility] Singulair and amoxicillin but missing methocarbamol. Called pharmacy and staff stated they are working on filling the methocarbamol now.  Pt notified.

## 2019-10-21 NOTE — Telephone Encounter (Signed)
Copied from [place] [phone]. Topic: General - Other >> Oct 21, 2019 11:30 AM Keene Breath wrote: Reason for CRM: Patient requests a call from the nurse regarding his medications.  He feels that the pharmacy filled some scripts that were different from what the doctor told him he would need.  Please advise and call patient to discuss at [phone]

## 2019-10-21 NOTE — Assessment & Plan Note (Signed)
Running a little high today. Likely due to pain and steroid yesterday. Will continue current regimen. Continue to monitor. Call with any concerns.

## 2019-10-22 LAB — CBC WITH DIFFERENTIAL/PLATELET
Basophils Absolute: 0 10*3/uL (ref 0.0–0.2)
Basos: 0 %
EOS (ABSOLUTE): 0 10*3/uL (ref 0.0–0.4)
Eos: 0 %
Hematocrit: 42.3 % (ref 37.5–51.0)
Hemoglobin: 14.2 g/dL (ref 13.0–17.7)
Immature Grans (Abs): 0.1 10*3/uL (ref 0.0–0.1)
Immature Granulocytes: 1 %
Lymphocytes Absolute: 0.7 10*3/uL (ref 0.7–3.1)
Lymphs: 4 %
MCH: 30.7 pg (ref 26.6–33.0)
MCHC: 33.6 g/dL (ref 31.5–35.7)
MCV: 91 fL (ref 79–97)
Monocytes Absolute: 0.4 10*3/uL (ref 0.1–0.9)
Monocytes: 2 %
Neutrophils Absolute: 15.8 10*3/uL — ABNORMAL HIGH (ref 1.4–7.0)
Neutrophils: 93 %
Platelets: 295 10*3/uL (ref 150–450)
RBC: 4.63 x10E6/uL (ref 4.14–5.80)
RDW: 13.1 % (ref 11.6–15.4)
WBC: 17 10*3/uL — ABNORMAL HIGH (ref 3.4–10.8)

## 2019-10-22 LAB — COMPREHENSIVE METABOLIC PANEL
ALT: 37 IU/L (ref 0–44)
AST: 27 IU/L (ref 0–40)
Albumin/Globulin Ratio: 1.5 (ref 1.2–2.2)
Albumin: 4.2 g/dL (ref 3.8–4.9)
Alkaline Phosphatase: 123 IU/L — ABNORMAL HIGH (ref 48–121)
BUN/Creatinine Ratio: 17 (ref 9–20)
BUN: 16 mg/dL (ref 6–24)
Bilirubin Total: 0.2 mg/dL (ref 0.0–1.2)
CO2: 21 mmol/L (ref 20–29)
Calcium: 10 mg/dL (ref 8.7–10.2)
Chloride: 101 mmol/L (ref 96–106)
Creatinine, Ser: 0.95 mg/dL (ref 0.76–1.27)
GFR calc Af Amer: 107 mL/min/{1.73_m2} (ref >59)
GFR calc non Af Amer: 92 mL/min/{1.73_m2} (ref >59)
Globulin, Total: 2.8 g/dL (ref 1.5–4.5)
Glucose: 258 mg/dL — ABNORMAL HIGH (ref 65–99)
Potassium: 4.2 mmol/L (ref 3.5–5.2)
Sodium: 140 mmol/L (ref 134–144)
Total Protein: 7 g/dL (ref 6.0–8.5)

## 2019-10-22 LAB — LIPID PANEL W/O CHOL/HDL RATIO
Cholesterol, Total: 210 mg/dL — ABNORMAL HIGH (ref 100–199)
HDL: 47 mg/dL (ref >39)
LDL Chol Calc (NIH): 148 mg/dL — ABNORMAL HIGH (ref 0–99)
Triglycerides: 85 mg/dL (ref 0–149)
VLDL Cholesterol Cal: 15 mg/dL (ref 5–40)

## 2019-10-22 LAB — URIC ACID: Uric Acid: 4.3 mg/dL (ref 3.8–8.4)

## 2019-10-22 LAB — PSA: Prostate Specific Ag, Serum: 1.3 ng/mL (ref 0.0–4.0)

## 2019-10-22 LAB — TSH: TSH: 0.183 u[IU]/mL — ABNORMAL LOW (ref 0.450–4.500)

## 2019-10-22 LAB — HEPATITIS C ANTIBODY: Hep C Virus Ab: <0.1 s/co ratio (ref 0.0–0.9)

## 2019-10-27 ENCOUNTER — Telehealth: Payer: Self-pay | Admitting: Family Medicine

## 2019-10-27 NOTE — Telephone Encounter (Signed)
Copied from Page [id number]. Topic: General - Other >> Oct 27, 2019  3:53 PM Hinda Lenis D wrote: Ple[facility] / [phone] fax [phone]  ask for a work letter from appt 8/25 / PT is excused for 5 days or 35 days?

## 2019-10-28 NOTE — Telephone Encounter (Signed)
No, it's 8/25-8/30

## 2019-10-28 NOTE — Telephone Encounter (Signed)
Routing to provider to clarify. Was the patient supposed to be out until 10/26/19 or 11/26/19? Letter was written from 10/21/19 - 11/26/19. Is this correct? Did not see anything in the office visit note.

## 2019-10-28 NOTE — Telephone Encounter (Signed)
Called and spoke to [place]. Gave her the corrected dates for the patient and also created new letter and faxed it over to them.

## 2019-10-30 NOTE — Telephone Encounter (Signed)
PT asking for another work note from 10/27/19 till 11/03/19 / please advise

## 2019-11-03 ENCOUNTER — Other Ambulatory Visit: Payer: Self-pay | Admitting: Family Medicine

## 2019-11-03 DIAGNOSIS — R7989 Other specified abnormal findings of blood chemistry: Secondary | ICD-10-CM

## 2019-11-03 DIAGNOSIS — D72829 Elevated white blood cell count, unspecified: Secondary | ICD-10-CM

## 2019-11-12 ENCOUNTER — Encounter: Payer: Self-pay | Admitting: Family Medicine

## 2019-11-24 ENCOUNTER — Ambulatory Visit (INDEPENDENT_AMBULATORY_CARE_PROVIDER_SITE_OTHER): Payer: 59 | Admitting: Family Medicine

## 2019-11-24 ENCOUNTER — Other Ambulatory Visit: Payer: Self-pay

## 2019-11-24 ENCOUNTER — Encounter: Payer: Self-pay | Admitting: Family Medicine

## 2019-11-24 VITALS — BP 131/86 | HR 76 | Temp 98.5°F | Wt 231.0 lb

## 2019-11-24 DIAGNOSIS — M5441 Lumbago with sciatica, right side: Secondary | ICD-10-CM | POA: Diagnosis not present

## 2019-11-24 MED ORDER — NORTRIPTYLINE HCL 25 MG PO CAPS: 25.0000 mg | ORAL_CAPSULE | Freq: Every day | ORAL | 3 refills | Status: DC

## 2019-11-24 MED ORDER — PREDNISONE 10 MG PO TABS
ORAL_TABLET | ORAL | 0 refills | Status: DC
Start: 2019-11-24 — End: 2019-12-17

## 2019-11-24 MED ORDER — HYDROCODONE-ACETAMINOPHEN 10-325 MG PO TABS: 1.0000 | ORAL_TABLET | Freq: Three times a day (TID) | ORAL | 0 refills | Status: DC | PRN

## 2019-11-24 NOTE — Patient Instructions (Signed)
[facility]  [phone]

## 2019-11-24 NOTE — Progress Notes (Signed)
BP 131/86   Pulse 76   Temp 98.5 F (36.9 C) (Oral)   Wt 231 lb (104.8 kg)   SpO2 96%   BMI 31.63 kg/m    Subjective:    Patient ID: Chase Hull, male    DOB: [birth year], 54 y.o.   MRN: [id number]  HPI: Chase Hull is a 54 y.o. male  Chief Complaint  Patient presents with  . Back Pain    lower back. pt states is schedule with Dr Holley Raring on 12/24/19. pt states he need something for pain in the meantime   . Leg Pain    right leg   BACK PAIN Duration: over a month Mechanism of injury: unknown Location: and R>L Leg and low back Onset: sudden Severity: severe Quality: shooting and aching Frequency: constant Radiation: R leg below the knee and L leg below the knee Aggravating factors: lifting, movement, walking, laying and bending Alleviating factors: rest, ice, heat and laying Status: stable Treatments attempted: rest, ice, heat, APAP, ibuprofen and aleve  Relief with NSAIDs?: no Nighttime pain:  yes Paresthesias / decreased sensation:  no Bowel / bladder incontinence:  no Fevers:  no Dysuria / urinary frequency:  no  Relevant past medical, surgical, family and social history reviewed and updated as indicated. Interim medical history since our last visit reviewed. Allergies and medications reviewed and updated.  Review of Systems  Constitutional: Negative.   Respiratory: Negative.   Cardiovascular: Negative.   Gastrointestinal: Negative.   Musculoskeletal: Positive for back pain and myalgias. Negative for arthralgias, gait problem, joint swelling, neck pain and neck stiffness.  Neurological: Negative.   Psychiatric/Behavioral: Negative.     Per HPI unless specifically indicated above     Objective:    BP 131/86   Pulse 76   Temp 98.5 F (36.9 C) (Oral)   Wt 231 lb (104.8 kg)   SpO2 96%   BMI 31.63 kg/m   Wt Readings from Last 3 Encounters:  11/24/19 231 lb (104.8 kg)  10/21/19 241 lb 3.2 oz (109.4 kg)  10/20/19 225 lb (102.1 kg)    Physical  Exam Vitals and nursing note reviewed.  Constitutional:      General: He is not in acute distress.    Appearance: Normal appearance. He is not ill-appearing, toxic-appearing or diaphoretic.  HENT:     Head: Normocephalic and atraumatic.     Right Ear: External ear normal.     Left Ear: External ear normal.     Nose: Nose normal.     Mouth/Throat:     Mouth: Mucous membranes are moist.     Pharynx: Oropharynx is clear.  Eyes:     General: No scleral icterus.       Right eye: No discharge.        Left eye: No discharge.     Extraocular Movements: Extraocular movements intact.     Conjunctiva/sclera: Conjunctivae normal.     Pupils: Pupils are equal, round, and reactive to light.  Cardiovascular:     Rate and Rhythm: Normal rate and regular rhythm.     Pulses: Normal pulses.     Heart sounds: Normal heart sounds. No murmur heard.  No friction rub. No gallop.   Pulmonary:     Effort: Pulmonary effort is normal. No respiratory distress.     Breath sounds: Normal breath sounds. No stridor. No wheezing, rhonchi or rales.  Chest:     Chest wall: No tenderness.  Musculoskeletal:  General: Normal range of motion.     Cervical back: Normal range of motion and neck supple.  Skin:    General: Skin is warm and dry.     Capillary Refill: Capillary refill takes less than 2 seconds.     Coloration: Skin is not jaundiced or pale.     Findings: No bruising, erythema, lesion or rash.  Neurological:     General: No focal deficit present.     Mental Status: He is alert and oriented to person, place, and time. Mental status is at baseline.  Psychiatric:        Mood and Affect: Mood normal.        Behavior: Behavior normal.        Thought Content: Thought content normal.        Judgment: Judgment normal.     Results for orders placed or performed in visit on 10/21/19  Microscopic Examination   Urine  Result Value Ref Range   WBC, UA None seen 0 - 5 /hpf   RBC None seen 0 - 2 /hpf    Epithelial Cells (non renal) 0-10 0 - 10 /hpf   Bacteria, UA None seen None seen/Few  Bayer DCA Hb A1c Waived  Result Value Ref Range   HB A1C (BAYER DCA - WAIVED) 5.8 <7.0 %  CBC with Differential/Platelet  Result Value Ref Range   WBC 17.0 (H) 3.4 - 10.8 x10E3/uL   RBC 4.63 4.14 - 5.80 x10E6/uL   Hemoglobin 14.2 13.0 - 17.7 g/dL   Hematocrit 42.3 37.5 - 51.0 %   MCV 91 79 - 97 fL   MCH 30.7 26.6 - 33.0 pg   MCHC 33.6 31 - 35 g/dL   RDW 13.1 11.6 - 15.4 %   Platelets 295 150 - 450 x10E3/uL   Neutrophils 93 Not Estab. %   Lymphs 4 Not Estab. %   Monocytes 2 Not Estab. %   Eos 0 Not Estab. %   Basos 0 Not Estab. %   Neutrophils Absolute 15.8 (H) 1 - 7 x10E3/uL   Lymphocytes Absolute 0.7 0 - 3 x10E3/uL   Monocytes Absolute 0.4 0 - 0 x10E3/uL   EOS (ABSOLUTE) 0.0 0.0 - 0.4 x10E3/uL   Basophils Absolute 0.0 0 - 0 x10E3/uL   Immature Granulocytes 1 Not Estab. %   Immature Grans (Abs) 0.1 0.0 - 0.1 x10E3/uL  Comprehensive metabolic panel  Result Value Ref Range   Glucose 258 (H) 65 - 99 mg/dL   BUN 16 6 - 24 mg/dL   Creatinine, Ser 0.95 0.76 - 1.27 mg/dL   GFR calc non Af Amer 92 >59 mL/min/1.73   GFR calc Af Amer 107 >59 mL/min/1.73   BUN/Creatinine Ratio 17 9 - 20   Sodium 140 134 - 144 mmol/L   Potassium 4.2 3.5 - 5.2 mmol/L   Chloride 101 96 - 106 mmol/L   CO2 21 20 - 29 mmol/L   Calcium 10.0 8.7 - 10.2 mg/dL   Total Protein 7.0 6.0 - 8.5 g/dL   Albumin 4.2 3.8 - 4.9 g/dL   Globulin, Total 2.8 1.5 - 4.5 g/dL   Albumin/Globulin Ratio 1.5 1.2 - 2.2   Bilirubin Total 0.2 0.0 - 1.2 mg/dL   Alkaline Phosphatase 123 (H) 48 - 121 IU/L   AST 27 0 - 40 IU/L   ALT 37 0 - 44 IU/L  Lipid Panel w/o Chol/HDL Ratio  Result Value Ref Range   Cholesterol, Total 210 (H) 100 - 199  mg/dL   Triglycerides 85 0 - 149 mg/dL   HDL 47 >39 mg/dL   VLDL Cholesterol Cal 15 5 - 40 mg/dL   LDL Chol Calc (NIH) 148 (H) 0 - 99 mg/dL  Microalbumin, Urine Waived  Result Value Ref Range    Microalb, Ur Waived 150 (H) 0 - 19 mg/L   Creatinine, Urine Waived 200 10 - 300 mg/dL   Microalb/Creat Ratio 30-300 (H) <30 mg/g  PSA  Result Value Ref Range   Prostate Specific Ag, Serum 1.3 0.0 - 4.0 ng/mL  TSH  Result Value Ref Range   TSH 0.183 (L) 0.450 - 4.500 uIU/mL  Urinalysis, Routine w reflex microscopic  Result Value Ref Range   Specific Gravity, UA 1.020 1.005 - 1.030   pH, UA 6.0 5.0 - 7.5   Color, UA Yellow Yellow   Appearance Ur Clear Clear   Leukocytes,UA Negative Negative   Protein,UA 2+ (A) Negative/Trace   Glucose, UA 3+ (A) Negative   Ketones, UA Trace (A) Negative   RBC, UA Negative Negative   Bilirubin, UA Negative Negative   Urobilinogen, Ur 0.2 0.2 - 1.0 mg/dL   Nitrite, UA Negative Negative   Microscopic Examination See below:   Uric acid  Result Value Ref Range   Uric Acid 4.3 3.8 - 8.4 mg/dL  Hepatitis C Antibody  Result Value Ref Range   Hep C Virus Ab <0.1 0.0 - 0.9 s/co ratio      Assessment & Plan:   Problem List Items Addressed This Visit    None    Visit Diagnoses    Acute bilateral low back pain with right-sided sciatica    -  Primary   Will start prednisone, amitriptyline and short course of hydrocodone. Will obtain X-ray, MRI if indicated. Refer to PT.   Relevant Medications   predniSONE (DELTASONE) 10 MG tablet   nortriptyline (PAMELOR) 25 MG capsule   HYDROcodone-acetaminophen (NORCO) 10-325 MG tablet   Other Relevant Orders   DG Lumbar Spine Complete   Ambulatory referral to Physical Therapy       Follow up plan: Return in about 2 weeks (around 12/08/2019).

## 2019-11-25 ENCOUNTER — Telehealth: Payer: Self-pay | Admitting: Family Medicine

## 2019-11-25 NOTE — Telephone Encounter (Signed)
Copied from [place] [phone]. Topic: General - Other >> Nov 25, 2019  4:08 PM Celene Kras wrote: Reason for CRM: Pts employer calling and is requesting exact dates for the pt to be put on light duty. Please advise.    [phone]

## 2019-11-25 NOTE — Telephone Encounter (Signed)
Routing to provider to advise. Did not see this documented in note.

## 2019-11-26 NOTE — Telephone Encounter (Signed)
From date of last visit to next visit.

## 2019-11-27 NOTE — Telephone Encounter (Signed)
Called and LVM asking for a returned call. Patient was last seen 11/24/19 and next visit is scheduled for 12/17/19.

## 2019-12-03 ENCOUNTER — Other Ambulatory Visit: Payer: Self-pay

## 2019-12-03 ENCOUNTER — Other Ambulatory Visit: Payer: Managed Care, Other (non HMO)

## 2019-12-03 ENCOUNTER — Ambulatory Visit
Admission: RE | Admit: 2019-12-03 | Discharge: 2019-12-03 | Disposition: A | Discharge status: Home / Self Care | Payer: Managed Care, Other (non HMO) | Source: Ambulatory Visit | Attending: Family Medicine | Admitting: Family Medicine

## 2019-12-03 DIAGNOSIS — M5441 Lumbago with sciatica, right side: Secondary | ICD-10-CM | POA: Insufficient documentation

## 2019-12-03 DIAGNOSIS — R7989 Other specified abnormal findings of blood chemistry: Secondary | ICD-10-CM

## 2019-12-03 DIAGNOSIS — D72829 Elevated white blood cell count, unspecified: Secondary | ICD-10-CM

## 2019-12-04 LAB — CBC WITH DIFFERENTIAL/PLATELET
Basophils Absolute: 0 10*3/uL (ref 0.0–0.2)
Basos: 0 %
EOS (ABSOLUTE): 0.1 10*3/uL (ref 0.0–0.4)
Eos: 1 %
Hematocrit: 40 % (ref 37.5–51.0)
Hemoglobin: 13.7 g/dL (ref 13.0–17.7)
Immature Grans (Abs): 0.1 10*3/uL (ref 0.0–0.1)
Immature Granulocytes: 1 %
Lymphocytes Absolute: 3.9 10*3/uL — ABNORMAL HIGH (ref 0.7–3.1)
Lymphs: 27 %
MCH: 30.4 pg (ref 26.6–33.0)
MCHC: 34.3 g/dL (ref 31.5–35.7)
MCV: 89 fL (ref 79–97)
Monocytes Absolute: 0.8 10*3/uL (ref 0.1–0.9)
Monocytes: 6 %
Neutrophils Absolute: 9.6 10*3/uL — ABNORMAL HIGH (ref 1.4–7.0)
Neutrophils: 65 %
Platelets: 350 10*3/uL (ref 150–450)
RBC: 4.5 x10E6/uL (ref 4.14–5.80)
RDW: 13 % (ref 11.6–15.4)
WBC: 14.5 10*3/uL — ABNORMAL HIGH (ref 3.4–10.8)

## 2019-12-04 LAB — TSH: TSH: 0.432 u[IU]/mL — ABNORMAL LOW (ref 0.450–4.500)

## 2019-12-09 ENCOUNTER — Other Ambulatory Visit: Payer: Self-pay | Admitting: Family Medicine

## 2019-12-09 DIAGNOSIS — E0590 Thyrotoxicosis, unspecified without thyrotoxic crisis or storm: Secondary | ICD-10-CM

## 2019-12-09 DIAGNOSIS — M5137 Other intervertebral disc degeneration, lumbosacral region: Secondary | ICD-10-CM

## 2019-12-09 NOTE — Progress Notes (Signed)
Mri lumbar

## 2019-12-16 ENCOUNTER — Other Ambulatory Visit: Payer: Self-pay | Admitting: Family Medicine

## 2019-12-16 NOTE — Telephone Encounter (Signed)
° °  Notes to clinic:  REQUEST FOR 90 DAYS PRESCRIPTION with 2 refills   Requested Prescriptions  Pending Prescriptions Disp Refills   nortriptyline (PAMELOR) 25 MG capsule [Pharmacy Med Name: NORTRIPTYLINE HCL 25 MG CAP] 90 capsule 2    Sig: Take 1 capsule (25 mg total) by mouth at bedtime.      Psychiatry:  Antidepressants - Heterocyclics (TCAs) Passed - 12/16/2019 11:30 AM      Passed - Valid encounter within last 6 months    Recent Outpatient Visits           3 weeks ago Acute bilateral low back pain with right-sided sciatica   [place], Megan P, DO   1 month ago Routine general medical examination at a health care facility   [facility], [place] P, DO   2 months ago DDD (degenerative disc disease), lumbosacral   [facility] [place], Henrine Screws T, NP   6 months ago Diabetes mellitus without complication [facility])   [place], Megan P, DO   9 months ago Essential hypertension   [place], [place], DO       Future Appointments             Tomorrow Valerie Roys, DO [place], [place]   In 4 months [place], Barb Merino, DO [employer], [facility]

## 2019-12-16 NOTE — Telephone Encounter (Signed)
fyi scheduled for an appt tomorrow

## 2019-12-17 ENCOUNTER — Encounter: Payer: Self-pay | Admitting: Family Medicine

## 2019-12-17 ENCOUNTER — Ambulatory Visit (INDEPENDENT_AMBULATORY_CARE_PROVIDER_SITE_OTHER): Payer: 59 | Admitting: Family Medicine

## 2019-12-17 ENCOUNTER — Other Ambulatory Visit: Payer: Self-pay

## 2019-12-17 VITALS — BP 107/69 | HR 97 | Temp 98.2°F | Wt 231.8 lb

## 2019-12-17 DIAGNOSIS — M5441 Lumbago with sciatica, right side: Secondary | ICD-10-CM

## 2019-12-17 MED ORDER — NORTRIPTYLINE HCL 50 MG PO CAPS
50.0000 mg | ORAL_CAPSULE | Freq: Every day | ORAL | 1 refills | Status: DC
Start: 2019-12-17 — End: 2020-04-22

## 2019-12-17 MED ORDER — HYDROCODONE-ACETAMINOPHEN 10-325 MG PO TABS
1.0000 | ORAL_TABLET | Freq: Three times a day (TID) | ORAL | 0 refills | Status: DC | PRN
Start: 2019-12-17 — End: 2020-07-19

## 2019-12-17 MED ORDER — NAPROXEN 500 MG PO TABS: 500.0000 mg | ORAL_TABLET | Freq: Two times a day (BID) | ORAL | 0 refills | Status: DC

## 2019-12-17 NOTE — Patient Instructions (Signed)
[facility] [address] [place], [place] [address] Phone: [phone]  [facility] [address] [place], [place] [address] Phone: [phone]

## 2019-12-17 NOTE — Progress Notes (Signed)
BP 107/69   Pulse 97   Temp 98.2 F (36.8 C) (Oral)   Wt 231 lb 12.8 oz (105.1 kg)   SpO2 99%   BMI 31.74 kg/m    Subjective:    Patient ID: Chase Hull, male    DOB: [birth year], 54 y.o.   MRN: [id number]  HPI: Chase Hull is a 54 y.o. male  Chief Complaint  Patient presents with  . Back Pain    2 week f/up- pt states he is still having back pain, had side effects to gabapentin and nortriptyline not working per patient. States the Prednisone eased the pain some while he was on it.    BACK PAIN- has an appointment with pain management next week.  Duration: 3 months Mechanism of injury: unknown Location: low back and R>L leg Onset: sudden Severity: severe  Quality: shooting and aching Frequency: constant Radiation: R leg below the knee and L leg below the knee Aggravating factors: lifting, movement, walking, laying and bending Alleviating factors: rest, ice, heat, laying and narcotics Status: stable Treatments attempted: rest, ice, heat, APAP, ibuprofen and aleve  Relief with NSAIDs?: mild Nighttime pain:  yes Paresthesias / decreased sensation:  yes Bowel / bladder incontinence:  no Fevers:  no Dysuria / urinary frequency:  no  Relevant past medical, surgical, family and social history reviewed and updated as indicated. Interim medical history since our last visit reviewed. Allergies and medications reviewed and updated.  Review of Systems  Constitutional: Negative.   Respiratory: Negative.   Cardiovascular: Negative.   Gastrointestinal: Negative.   Musculoskeletal: Positive for back pain and myalgias. Negative for arthralgias, gait problem, joint swelling, neck pain and neck stiffness.  Skin: Negative.   Neurological: Negative.   Psychiatric/Behavioral: Negative.     Per HPI unless specifically indicated above     Objective:    BP 107/69   Pulse 97   Temp 98.2 F (36.8 C) (Oral)   Wt 231 lb 12.8 oz (105.1 kg)   SpO2 99%   BMI 31.74 kg/m   Wt  Readings from Last 3 Encounters:  12/17/19 231 lb 12.8 oz (105.1 kg)  11/24/19 231 lb (104.8 kg)  10/21/19 241 lb 3.2 oz (109.4 kg)    Physical Exam Vitals and nursing note reviewed.  Constitutional:      General: He is not in acute distress.    Appearance: Normal appearance. He is not ill-appearing, toxic-appearing or diaphoretic.  HENT:     Head: Normocephalic and atraumatic.     Right Ear: External ear normal.     Left Ear: External ear normal.     Nose: Nose normal.     Mouth/Throat:     Mouth: Mucous membranes are moist.     Pharynx: Oropharynx is clear.  Eyes:     General: No scleral icterus.       Right eye: No discharge.        Left eye: No discharge.     Extraocular Movements: Extraocular movements intact.     Conjunctiva/sclera: Conjunctivae normal.     Pupils: Pupils are equal, round, and reactive to light.  Cardiovascular:     Rate and Rhythm: Normal rate and regular rhythm.     Pulses: Normal pulses.     Heart sounds: Normal heart sounds. No murmur heard.  No friction rub. No gallop.   Pulmonary:     Effort: Pulmonary effort is normal. No respiratory distress.     Breath sounds: Normal breath sounds. No stridor. No  wheezing, rhonchi or rales.  Chest:     Chest wall: No tenderness.  Musculoskeletal:        General: Normal range of motion.     Cervical back: Normal range of motion and neck supple.  Skin:    General: Skin is warm and dry.     Capillary Refill: Capillary refill takes less than 2 seconds.     Coloration: Skin is not jaundiced or pale.     Findings: No bruising, erythema, lesion or rash.  Neurological:     General: No focal deficit present.     Mental Status: He is alert and oriented to person, place, and time. Mental status is at baseline.  Psychiatric:        Mood and Affect: Mood normal.        Behavior: Behavior normal.        Thought Content: Thought content normal.        Judgment: Judgment normal.     Results for orders placed or  performed in visit on 12/03/19  TSH  Result Value Ref Range   TSH 0.432 (L) 0.450 - 4.500 uIU/mL  CBC with Differential/Platelet  Result Value Ref Range   WBC 14.5 (H) 3.4 - 10.8 x10E3/uL   RBC 4.50 4.14 - 5.80 x10E6/uL   Hemoglobin 13.7 13.0 - 17.7 g/dL   Hematocrit 40.0 37.5 - 51.0 %   MCV 89 79 - 97 fL   MCH 30.4 26.6 - 33.0 pg   MCHC 34.3 31 - 35 g/dL   RDW 13.0 11.6 - 15.4 %   Platelets 350 150 - 450 x10E3/uL   Neutrophils 65 Not Estab. %   Lymphs 27 Not Estab. %   Monocytes 6 Not Estab. %   Eos 1 Not Estab. %   Basos 0 Not Estab. %   Neutrophils Absolute 9.6 (H) 1.40 - 7.00 x10E3/uL   Lymphocytes Absolute 3.9 (H) 0 - 3 x10E3/uL   Monocytes Absolute 0.8 0 - 0 x10E3/uL   EOS (ABSOLUTE) 0.1 0.0 - 0.4 x10E3/uL   Basophils Absolute 0.0 0 - 0 x10E3/uL   Immature Granulocytes 1 Not Estab. %   Immature Grans (Abs) 0.1 0.0 - 0.1 x10E3/uL      Assessment & Plan:   Problem List Items Addressed This Visit    None    Visit Diagnoses    Acute bilateral low back pain with right-sided sciatica    -  Primary   Getting MRI next week. Seeing pain management next week. Will start naproxen, get into PT and increase nortriptyine. PRN pain meds. Call with any concerns.    Relevant Medications   naproxen (NAPROSYN) 500 MG tablet   nortriptyline (PAMELOR) 50 MG capsule   HYDROcodone-acetaminophen (NORCO) 10-325 MG tablet   Other Relevant Orders   Ambulatory referral to Physical Therapy       Follow up plan: Return in about 4 weeks (around 01/14/2020).

## 2019-12-18 ENCOUNTER — Other Ambulatory Visit: Payer: Self-pay | Admitting: Family Medicine

## 2019-12-24 ENCOUNTER — Encounter: Payer: Self-pay | Admitting: Student in an Organized Health Care Education/Training Program

## 2019-12-24 ENCOUNTER — Ambulatory Visit (HOSPITAL_BASED_OUTPATIENT_CLINIC_OR_DEPARTMENT_OTHER): Payer: 59 | Admitting: Student in an Organized Health Care Education/Training Program

## 2019-12-24 ENCOUNTER — Other Ambulatory Visit: Payer: Self-pay

## 2019-12-24 ENCOUNTER — Ambulatory Visit
Admission: RE | Admit: 2019-12-24 | Discharge: 2019-12-24 | Disposition: A | Discharge status: Home / Self Care | Payer: 59 | Source: Ambulatory Visit | Attending: Family Medicine | Admitting: Family Medicine

## 2019-12-24 ENCOUNTER — Encounter: Payer: Self-pay | Admitting: *Deleted

## 2019-12-24 VITALS — BP 127/98 | HR 92 | Temp 98.4°F | Ht 72.0 in | Wt 231.0 lb

## 2019-12-24 DIAGNOSIS — G894 Chronic pain syndrome: Secondary | ICD-10-CM | POA: Insufficient documentation

## 2019-12-24 DIAGNOSIS — M48061 Spinal stenosis, lumbar region without neurogenic claudication: Secondary | ICD-10-CM | POA: Insufficient documentation

## 2019-12-24 DIAGNOSIS — M5416 Radiculopathy, lumbar region: Secondary | ICD-10-CM | POA: Insufficient documentation

## 2019-12-24 DIAGNOSIS — M5137 Other intervertebral disc degeneration, lumbosacral region: Secondary | ICD-10-CM | POA: Diagnosis not present

## 2019-12-24 DIAGNOSIS — G8929 Other chronic pain: Secondary | ICD-10-CM | POA: Diagnosis not present

## 2019-12-24 NOTE — Progress Notes (Signed)
Patient: Chase Hull  Service Category: E/M  Provider: Gillis Santa, MD  DOB: [birth year]  DOS: 12/24/2019  Referring Provider: Venita Lick, NP  MRN: [id number]  Setting: Ambulatory outpatient  PCP: Valerie Roys, DO  Type: New Patient  Specialty: Interventional Pain Management    Location: Office  Delivery: Face-to-face     Primary Reason(s) for Visit: Encounter for initial evaluation of one or more chronic problems (new to examiner) potentially causing chronic pain, and posing a threat to normal musculoskeletal function. (Level of risk: High) CC: Back Pain  HPI  Chase Hull is a 54 y.o. year old, male patient, who comes for the first time to our practice referred by Venita Lick, NP for our initial evaluation of his chronic pain. 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 Today he comes in for evaluation of his Back Pain  Pain Assessment: Location: Right, Left, Lower Back Radiating: pain radiaties down both leg, right leg is new Onset: More than a month ago Duration: Chronic pain Quality: Aching, Burning, Sharp, Shooting, Stabbing, Constant Severity: 9 /10 (subjective, self-reported pain score)  Effect on ADL: limits my daily activities Timing: Constant Modifying factors: nothing BP: (!) 127/98  HR: 92  Onset and Duration: Gradual and Present less than 3 months Cause of pain: Unknown Severity: Getting worse, NAS-11 at its worse: 10/10, NAS-11 at its best: 9/10, NAS-11 now: 9/10 and NAS-11 on the average: 9/10 Timing: Not influenced by the time of the day Aggravating Factors: Motion Alleviating Factors: no alleviating factors Associated  Problems: Pain that wakes patient up Quality of Pain: Aching, Sharp, Shooting and Stabbing Previous Examinations or Tests: X-rays Previous Treatments: Epidural steroid injections  Chase Hull is a pleasant 54 year old male who presents with a chief complaint of low back pain with radiation into bilateral lower extremity.  Patient has a history of left lower extremity radiating pain but states that he is now having right lower extremity radiating pain.  He has a history of lumbar disc protrusion at L5-S1 and mild spinal stenosis at L4-L5 for which she has received lumbar epidural steroid injections for in the past.  He received a series of injections was pain-free for about 2 to 3 years.  Now he is having return of his low back and bilateral leg pain.  His most recent MRI was in 2015.  His primary care doctor, Dr. Wynetta Emery has ordered a lumbar MRI for him which he is getting today.  Appreciate the work-up so far.  He is also scheduled for upcoming physical therapy.  He has tried to perform home stretching exercises but is looking forward to formal instruction about stretching exercises that he can perform.  Medications as below.  He has had a lumbar x-ray performed which shows mild lumbar spondylosis.  Denies any bowel or bladder dysfunction.   Meds   Current Outpatient Medications:  .  allopurinol (ZYLOPRIM) 100 MG tablet, TAKE 2 TABLETS BY MOUTH EVERY DAY, Disp: 180 tablet, Rfl: 2 .  amLODipine (NORVASC) 10 MG tablet, Take 1 tablet (10 mg total) by mouth daily., Disp: 90 tablet, Rfl: 1 .  aspirin 81 MG tablet, Take by mouth., Disp: , Rfl:  .  Blood Glucose Monitoring Suppl (FIFTY50 GLUCOSE METER 2.0) w/Device KIT, Use to check  fasting blood sugars daily, Disp: , Rfl:  .  lisinopril (ZESTRIL) 20 MG tablet, Take 1 tablet (20 mg total) by mouth daily., Disp: 90 tablet, Rfl: 1 .  nortriptyline (PAMELOR) 50 MG capsule, Take 1 capsule (50 mg total) by mouth at bedtime., Disp: 90 capsule, Rfl: 1 .   HYDROcodone-acetaminophen (NORCO) 10-325 MG tablet, Take 1 tablet by mouth every 8 (eight) hours as needed. (Patient not taking: Reported on 12/24/2019), Disp: 30 tablet, Rfl: 0 .  naproxen (NAPROSYN) 500 MG tablet, Take 1 tablet (500 mg total) by mouth 2 (two) times daily with a meal. (Patient not taking: Reported on 12/24/2019), Disp: 30 tablet, Rfl: 0  Imaging Review  DG Cervical Spine Complete  Narrative CLINICAL DATA:  Bilateral hand pain and numbness for the past week.  EXAM: CERVICAL SPINE - COMPLETE 4+ VIEW  COMPARISON:  None.  FINDINGS: The lateral view is diagnostic to the C7-T1 level. There is no acute fracture or subluxation. Vertebral body heights are preserved. Alignment is normal. Minimal disc height loss at C5-C6 and C6-C7. No neuroforaminal stenosis.Normal prevertebral soft tissues.  IMPRESSION: 1. Minimal spondylosis at C5-C6 and C6-C7.   Electronically Signed By: Titus Dubin M.D. On: 01/14/2018 18:26  Narrative CLINICAL DATA:  Low back pain  EXAM: LUMBAR SPINE - COMPLETE 4+ VIEW  COMPARISON:  None.  FINDINGS: Five lumbar type vertebral segments. Vertebral body heights and alignment are maintained. No fracture identified. Intervertebral disc height loss at L5-S1. The remaining intervertebral disc spaces are relatively preserved. Minimal degenerative endplate changes. Mild lower lumbar facet arthrosis. Moderate volume of stool projects throughout the colon.  IMPRESSION: Mild lumbar spondylosis of L5-S1.  No acute findings.   Electronically Signed By: Davina Poke D.O. On: 12/03/2019 16:20   Lumbar MRI at [facility] in 2015 shows left central disc protrusion at L5-S1, small central disc protrusion at L4-L5, mild spinal stenosis at L4-L5.  Complexity Note: Imaging results reviewed. Results shared with Chase Hull, using Layman's terms.                         ROS  Cardiovascular: No reported cardiovascular signs or symptoms such as High  blood pressure, coronary artery disease, abnormal heart rate or rhythm, heart attack, blood thinner therapy or heart weakness and/or failure Pulmonary or Respiratory: No reported pulmonary signs or symptoms such as wheezing and difficulty taking a deep full breath (Asthma), difficulty blowing air out (Emphysema), coughing up mucus (Bronchitis), persistent dry cough, or temporary stoppage of breathing during sleep Neurological: No reported neurological signs or symptoms such as seizures, abnormal skin sensations, urinary and/or fecal incontinence, being born with an abnormal open spine and/or a tethered spinal cord Psychological-Psychiatric: No reported psychological or psychiatric signs or symptoms such as difficulty sleeping, anxiety, depression, delusions or hallucinations (schizophrenial), mood swings (bipolar disorders) or suicidal ideations or attempts Gastrointestinal: No reported gastrointestinal signs or symptoms such as vomiting or evacuating blood, reflux, heartburn, alternating episodes of diarrhea and constipation, inflamed or scarred liver, or pancreas or irrregular and/or infrequent bowel movements Genitourinary: No reported renal or genitourinary signs or symptoms such as difficulty voiding or producing urine, peeing blood, non-functioning kidney, kidney stones, difficulty emptying the bladder, difficulty controlling the flow of urine, or chronic kidney disease Hematological: No reported hematological signs or symptoms such as prolonged bleeding, low or poor functioning platelets, bruising or bleeding easily, hereditary bleeding problems, low energy levels due to low hemoglobin or being anemic Endocrine: No reported endocrine signs  or symptoms such as high or low blood sugar, rapid heart rate due to high thyroid levels, obesity or weight gain due to slow thyroid or thyroid disease Rheumatologic: No reported rheumatological signs and symptoms such as fatigue, joint pain, tenderness, swelling,  redness, heat, stiffness, decreased range of motion, with or without associated rash Musculoskeletal: Negative for myasthenia gravis, muscular dystrophy, multiple sclerosis or malignant hyperthermia Work History: Working full time  Allergies  Mr. Kissoon is allergic to gabapentin.  Laboratory Chemistry Profile   Renal Lab Results  Component Value Date   BUN 16 10/21/2019   CREATININE 0.95 10/21/2019   BCR 17 10/21/2019   GFRAA 107 10/21/2019   GFRNONAA 92 10/21/2019   SPECGRAV 1.020 10/21/2019   PHUR 6.0 10/21/2019   PROTEINUR 2+ (A) 10/21/2019     Electrolytes Lab Results  Component Value Date   NA 140 10/21/2019   K 4.2 10/21/2019   CL 101 10/21/2019   CALCIUM 10.0 10/21/2019     Hepatic Lab Results  Component Value Date   AST 27 10/21/2019   ALT 37 10/21/2019   ALBUMIN 4.2 10/21/2019   ALKPHOS 123 (H) 10/21/2019     ID Lab Results  Component Value Date   HIV Non Reactive 03/13/2019   [place] NEGATIVE 03/26/2019     Bone No results found for: VD25OH, [id number], GN5621HY8, MV7846NG2, 25OHVITD1, 25OHVITD2, 25OHVITD3, TESTOFREE, TESTOSTERONE   Endocrine Lab Results  Component Value Date   GLUCOSE 258 (H) 10/21/2019   GLUCOSEU 3+ (A) 10/21/2019   HGBA1C 5.8 10/21/2019   TSH 0.432 (L) 12/03/2019     Neuropathy Lab Results  Component Value Date   HGBA1C 5.8 10/21/2019   HIV Non Reactive 03/13/2019     CNS No results found for: COLORCSF, APPEARCSF, RBCCOUNTCSF, WBCCSF, POLYSCSF, LYMPHSCSF, EOSCSF, PROTEINCSF, GLUCCSF, JCVIRUS, CSFOLI, IGGCSF, LABACHR, ACETBL, LABACHR, ACETBL   Inflammation (CRP: Acute  ESR: Chronic) No results found for: CRP, ESRSEDRATE, LATICACIDVEN   Rheumatology Lab Results  Component Value Date   LABURIC 4.3 10/21/2019     Coagulation Lab Results  Component Value Date   PLT 350 12/03/2019     Cardiovascular Lab Results  Component Value Date   HGB 13.7 12/03/2019   HCT 40.0 12/03/2019     Screening Lab Results   Component Value Date   SARSCOV2NAA NEGATIVE 03/26/2019   HIV Non Reactive 03/13/2019     Cancer No results found for: CEA, CA125, LABCA2   Allergens No results found for: ALMOND, APPLE, ASPARAGUS, AVOCADO, BANANA, BARLEY, BASIL, BAYLEAF, GREENBEAN, LIMABEAN, WHITEBEAN, BEEFIGE, REDBEET, BLUEBERRY, BROCCOLI, CABBAGE, MELON, CARROT, CASEIN, CASHEWNUT, CAULIFLOWER, CELERY     Note: Lab results reviewed.  [place]  Drug: Mr. Carmean  reports no history of drug use. Alcohol:  reports previous alcohol use. Tobacco:  reports that he has been smoking cigarettes. He has a 12.50 pack-year smoking history. He has never used smokeless tobacco. Medical:  has a past medical history of Carpal tunnel syndrome, Diabetes mellitus without complication ([place]), Gout, and Hypertension. Family: family history is not on file.  Past Surgical History:  Procedure Laterality Date  . COLONOSCOPY WITH PROPOFOL N/A 03/30/2019   Procedure: COLONOSCOPY WITH BIOPSY;  Surgeon: Jonathon Bellows, MD;  Location: [facility];  Service: Endoscopy;  Laterality: N/A;  Priority 4  . Left hand surgery  03/2018   Carpal tunnel  . POLYPECTOMY N/A 03/30/2019   Procedure: POLYPECTOMY;  Surgeon: Jonathon Bellows, MD;  Location: [facility];  Service: Endoscopy;  Laterality: N/A;  Active Ambulatory Problems    Diagnosis Date Noted  . Hypertension   . Diabetes mellitus without complication ([place])   . DDD (degenerative disc disease), lumbosacral 12/15/2013  . Hyperlipidemia 10/20/2010  . 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 Obesity (BMI 30-39.9) 04/08/2012  . Bilateral carpal tunnel syndrome 01/28/2018  . Lumbar radiculopathy 12/24/2019  . 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 Chronic pain syndrome 12/24/2019   Resolved Ambulatory Problems    Diagnosis Date Noted  . Gout    Past Medical History:  Diagnosis Date  . Carpal tunnel syndrome     Constitutional Exam  General appearance: Well nourished, well developed, and well hydrated. In no apparent acute distress Vitals:   12/24/19 1021  BP: (!) 127/98  Pulse: 92  Temp: 98.4 F (36.9 C)  SpO2: 100%  Weight: 231 lb (104.8 kg)  Height: 6' (1.829 m)   BMI Assessment: Estimated body mass index is 31.33 kg/m as calculated from the following:   Height as of this encounter: 6' (1.829 m).   Weight as of this encounter: 231 lb (104.8 kg).  BMI interpretation table: BMI level Category Range association with higher incidence of chronic pain  <18 kg/m2 Underweight   18.5-24.9 kg/m2 Ideal body weight   25-29.9 kg/m2 Overweight Increased incidence by 20%  30-34.9 kg/m2 Obese (Class I) Increased incidence by 68%  35-39.9 kg/m2 Severe obesity (Class II) Increased incidence by 136%  >40 kg/m2 Extreme obesity (Class III) Increased incidence by 254%   Patient's current BMI Ideal Body weight  Body mass index is 31.33 kg/m. Ideal body weight: 77.6 kg (171 lb 1.2 oz) Adjusted ideal body weight: 88.5 kg (195 lb 0.7 oz)   BMI Readings from Last 4 Encounters:  12/24/19 31.33 kg/m  12/17/19 31.74 kg/m  11/24/19 31.63 kg/m  10/21/19 33.03 kg/m   Wt Readings from Last 4 Encounters:  12/24/19 231 lb (104.8 kg)  12/17/19 231 lb 12.8 oz (105.1 kg)  11/24/19 231 lb (104.8 kg)  10/21/19 241 lb 3.2 oz (109.4 kg)    Psych/Mental status: Alert, oriented x 3 (person, place, & time)       Eyes: PERLA Respiratory: No evidence of acute respiratory distress  Cervical Spine Exam  Skin & Axial Inspection: No masses, redness, edema, swelling, or associated skin lesions Alignment: Symmetrical Functional ROM: Unrestricted ROM      Stability: No instability detected Muscle Tone/Strength: Functionally intact. No obvious neuro-muscular anomalies detected. Sensory (Neurological): Unimpaired Palpation: No palpable anomalies              Upper Extremity (UE) Exam    Side: Right upper  extremity  Side: Left upper extremity  Skin & Extremity Inspection: Skin color, temperature, and hair growth are WNL. No peripheral edema or cyanosis. No masses, redness, swelling, asymmetry, or associated skin lesions. No contractures.  Skin & Extremity Inspection: Skin color, temperature, and hair growth are WNL. No peripheral edema or cyanosis. No masses, redness, swelling, asymmetry, or associated skin lesions. No contractures.  Functional ROM: Unrestricted ROM          Functional ROM: Unrestricted ROM          Muscle Tone/Strength: Functionally intact. No obvious neuro-muscular anomalies detected.   Muscle Tone/Strength: Functionally intact. No obvious neuro-muscular anomalies detected.  Sensory (Neurological): Unimpaired          Sensory (Neurological): Unimpaired          Palpation: No palpable anomalies  Palpation: No palpable anomalies              Provocative Test(s):  Phalen's test: deferred Tinel's test: deferred Apley's scratch test (touch opposite shoulder):  Action 1 (Across chest): deferred Action 2 (Overhead): deferred Action 3 (LB reach): deferred   Provocative Test(s):  Phalen's test: deferred Tinel's test: deferred Apley's scratch test (touch opposite shoulder):  Action 1 (Across chest): deferred Action 2 (Overhead): deferred Action 3 (LB reach): deferred    Thoracic Spine Area Exam  Skin & Axial Inspection: No masses, redness, or swelling Alignment: Symmetrical Functional ROM: Unrestricted ROM Stability: No instability detected Muscle Tone/Strength: Functionally intact. No obvious neuro-muscular anomalies detected. Sensory (Neurological): Unimpaired Muscle strength & Tone: No palpable anomalies  Lumbar Exam  Skin & Axial Inspection: No masses, redness, or swelling Alignment: Symmetrical Functional ROM: Unrestricted ROM       Stability: No instability detected Muscle Tone/Strength: Functionally intact. No obvious neuro-muscular anomalies  detected. Sensory (Neurological): Dermatomal pain pattern Palpation: No palpable anomalies       Provocative Tests: Hyperextension/rotation test: deferred today       Lumbar quadrant test (Kemp's test): deferred today       Lateral bending test: (+) ipsilateral radicular pain, bilaterally. Positive for bilateral foraminal stenosis. Patrick's Maneuver: deferred today                   FABER* test: deferred today                   S-I anterior distraction/compression test: deferred today         S-I lateral compression test: deferred today         S-I Thigh-thrust test: deferred today         S-I Gaenslen's test: deferred today         *(Flexion, ABduction and External Rotation)  Gait & Posture Assessment  Ambulation: Unassisted Gait: Relatively normal for age and body habitus Posture: WNL   Lower Extremity Exam    Side: Right lower extremity  Side: Left lower extremity  Stability: No instability observed          Stability: No instability observed          Skin & Extremity Inspection: Skin color, temperature, and hair growth are WNL. No peripheral edema or cyanosis. No masses, redness, swelling, asymmetry, or associated skin lesions. No contractures.  Skin & Extremity Inspection: Skin color, temperature, and hair growth are WNL. No peripheral edema or cyanosis. No masses, redness, swelling, asymmetry, or associated skin lesions. No contractures.  Functional ROM: Pain restricted ROM for hip joint Limited SLR (straight leg raise)  Functional ROM: Pain restricted ROM for hip and knee joints Limited SLR (straight leg raise)  Muscle Tone/Strength: Functionally intact. No obvious neuro-muscular anomalies detected.  Muscle Tone/Strength: Functionally intact. No obvious neuro-muscular anomalies detected.  Sensory (Neurological): Dermatomal pain pattern        Sensory (Neurological): Dermatomal pain pattern        DTR: Patellar: deferred today Achilles: deferred today Plantar: deferred today   DTR: Patellar: deferred today Achilles: deferred today Plantar: deferred today  Palpation: No palpable anomalies  Palpation: No palpable anomalies   Assessment  Primary Diagnosis & Pertinent Problem List: The primary encounter diagnosis was Chronic radicular lumbar pain (left chronic, right new). 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  Visit Diagnosis (New problems to examiner):  1. Chronic radicular lumbar pain (left chronic, right new)   2. Lumbar radiculopathy   3. 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 Chronic pain syndrome    Plan of Care (Initial workup plan)  Proceed with MRI later today.  Follow-up in 2 weeks for lumbar epidural steroid injection.  May need to do series for lumbar radicular pain flare. Proceed with physical therapy consultation next month.   Procedure Orders     Lumbar Epidural Injection   Interventional management options: Mr. Sainz was informed that there is no guarantee that he would be a candidate for interventional therapies. The decision will be based on the results of diagnostic studies, as well as Mr. Sedgwick risk profile.  Procedure(s) under consideration:  L-ESI   Provider-requested follow-up: Return in about 2 weeks (around 01/07/2020) for L4/5 ESI , without sedation.  Future Appointments  Date Time Provider [place]  12/24/2019  6:00 PM [facility]  01/18/2020 11:00 AM Valerie Roys, DO [facility]  04/22/2020 10:00 AM Valerie Roys, DO [facility]    Note by: Gillis Santa, MD Date: 12/24/2019; Time: 10:46 AM

## 2019-12-24 NOTE — Progress Notes (Signed)
Safety precautions to be maintained throughout the outpatient stay will include: orient to surroundings, keep bed in low position, maintain call bell within reach at all times, provide assistance with transfer out of bed and ambulation.  

## 2020-01-18 ENCOUNTER — Ambulatory Visit: Payer: 59 | Admitting: Family Medicine

## 2020-02-17 ENCOUNTER — Other Ambulatory Visit: Payer: Self-pay

## 2020-02-17 ENCOUNTER — Encounter: Payer: Self-pay | Admitting: Student in an Organized Health Care Education/Training Program

## 2020-02-17 ENCOUNTER — Ambulatory Visit (HOSPITAL_BASED_OUTPATIENT_CLINIC_OR_DEPARTMENT_OTHER): Payer: 59 | Admitting: Student in an Organized Health Care Education/Training Program

## 2020-02-17 ENCOUNTER — Ambulatory Visit
Admission: RE | Admit: 2020-02-17 | Discharge: 2020-02-17 | Disposition: A | Discharge status: Home / Self Care | Payer: 59 | Source: Ambulatory Visit | Attending: Student in an Organized Health Care Education/Training Program | Admitting: Student in an Organized Health Care Education/Training Program

## 2020-02-17 VITALS — BP 133/94 | HR 98 | Temp 98.1°F | Resp 18 | Ht 72.0 in | Wt 230.0 lb

## 2020-02-17 DIAGNOSIS — G8929 Other chronic pain: Secondary | ICD-10-CM | POA: Diagnosis not present

## 2020-02-17 DIAGNOSIS — M48061 Spinal stenosis, lumbar region without neurogenic claudication: Secondary | ICD-10-CM | POA: Insufficient documentation

## 2020-02-17 DIAGNOSIS — M5416 Radiculopathy, lumbar region: Secondary | ICD-10-CM | POA: Diagnosis not present

## 2020-02-17 DIAGNOSIS — G894 Chronic pain syndrome: Secondary | ICD-10-CM | POA: Diagnosis not present

## 2020-02-17 MED ORDER — SODIUM CHLORIDE (PF) 0.9 % IJ SOLN
INTRAMUSCULAR | Status: AC
  Filled 2020-02-17: qty 10

## 2020-02-17 MED ORDER — ROPIVACAINE HCL 2 MG/ML IJ SOLN
2.0000 mL | Freq: Once | INTRAMUSCULAR | Status: AC
  Administered 2020-02-17: 2 mL via EPIDURAL
  Filled 2020-02-17: qty 10

## 2020-02-17 MED ORDER — SODIUM CHLORIDE 0.9% FLUSH
2.0000 mL | Freq: Once | INTRAVENOUS | Status: AC
  Administered 2020-02-17: 2 mL

## 2020-02-17 MED ORDER — IOHEXOL 180 MG/ML  SOLN
10.0000 mL | Freq: Once | INTRAMUSCULAR | Status: AC
  Administered 2020-02-17: 10 mL via EPIDURAL

## 2020-02-17 MED ORDER — LIDOCAINE HCL 2 % IJ SOLN
20.0000 mL | Freq: Once | INTRAMUSCULAR | Status: AC
  Administered 2020-02-17: 400 mg

## 2020-02-17 MED ORDER — DEXAMETHASONE SODIUM PHOSPHATE 10 MG/ML IJ SOLN
10.0000 mg | Freq: Once | INTRAMUSCULAR | Status: AC
  Administered 2020-02-17: 10 mg
  Filled 2020-02-17: qty 1

## 2020-02-17 NOTE — Progress Notes (Signed)
Safety precautions to be maintained throughout the outpatient stay will include: orient to surroundings, keep bed in low position, maintain call bell within reach at all times, provide assistance with transfer out of bed and ambulation.  

## 2020-02-17 NOTE — Progress Notes (Signed)
PROVIDER NOTE: Information contained herein reflects review and annotations entered in association with encounter. Interpretation of such information and data should be left to medically-trained personnel. Information provided to patient can be located elsewhere in the medical record under "Patient Instructions". Document created using STT-dictation technology, any transcriptional errors that may result from process are unintentional.    Patient: Chase Hull  Service Category: Procedure  Provider: Gillis Santa, MD  DOB: [birth year]  DOS: 02/17/2020  Location: [facility]  MRN: [id number]  Setting: Ambulatory - outpatient  Referring Provider: Valerie Roys, DO  Type: Established Patient  Specialty: Interventional Pain Management  PCP: Valerie Roys, DO   Primary Reason for Visit: Interventional Pain Management Treatment. CC: Back Pain (low)  Procedure:          Anesthesia, Analgesia, Anxiolysis:  Type: Diagnostic Inter-Laminar Epidural Steroid Injection  #1  Region: Lumbar Level: L4-5 Level. Laterality: Left-Sided         Type: Local Anesthesia  1-2%  Position: Prone with head of the table was raised to facilitate breathing.   Indications: 1. Chronic radicular lumbar pain (left chronic, right new)   2. Lumbar radiculopathy   3. 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 Chronic pain syndrome    Pain Score: Pre-procedure: 7 /10 Post-procedure: 6 /10   Pre-op H&P Assessment:  Chase Hull is a 54 y.o. (year old), male patient, seen today for interventional treatment. He  has a past surgical history that includes Left hand surgery (03/2018); Colonoscopy with propofol (N/A, 03/30/2019); and polypectomy (N/A, 03/30/2019). Mr. Hinh has a current medication list which includes the following prescription(s): allopurinol, amlodipine, aspirin, fifty50 glucose meter 2.0, lisinopril, naproxen, nortriptyline, and hydrocodone-acetaminophen. His  primarily concern today is the Back Pain (low)  Initial Vital Signs:  Pulse/HCG Rate: 98ECG Heart Rate: 80 Temp: 98.1 F (36.7 C) Resp: 18 BP: 121/87 SpO2: 100 %  BMI: Estimated body mass index is 31.19 kg/m as calculated from the following:   Height as of this encounter: 6' (1.829 m).   Weight as of this encounter: 230 lb (104.3 kg).  Risk Assessment: Allergies: Reviewed. He is allergic to gabapentin.  Allergy Precautions: None required Coagulopathies: Reviewed. None identified.  Blood-thinner therapy: None at this time Active Infection(s): Reviewed. None identified. Chase Hull is afebrile  Site Confirmation: Mr. Mascioli was asked to confirm the procedure and laterality before marking the site Procedure checklist: Completed Consent: Before the procedure and under the influence of no sedative(s), amnesic(s), or anxiolytics, the patient was informed of the treatment options, risks and possible complications. To fulfill our ethical and legal obligations, as recommended by the American Medical Association's Code of Ethics, I have informed the patient of my clinical impression; the nature and purpose of the treatment or procedure; the risks, benefits, and possible complications of the intervention; the alternatives, including doing nothing; the risk(s) and benefit(s) of the alternative treatment(s) or procedure(s); and the risk(s) and benefit(s) of doing nothing. The patient was provided information about the general risks and possible complications associated with the procedure. These may include, but are not limited to: failure to achieve desired goals, infection, bleeding, organ or nerve damage, allergic reactions, paralysis, and death. In addition, the patient was informed of those risks and complications associated to Spine-related procedures, such as failure to decrease pain; infection (i.e.: Meningitis, epidural or intraspinal abscess); bleeding (i.e.: epidural hematoma, subarachnoid  hemorrhage, or any other type of intraspinal or peri-dural bleeding); organ or  nerve damage (i.e.: Any type of peripheral nerve, nerve root, or spinal cord injury) with subsequent damage to sensory, motor, and/or autonomic systems, resulting in permanent pain, numbness, and/or weakness of one or several areas of the body; allergic reactions; (i.e.: anaphylactic reaction); and/or death. Furthermore, the patient was informed of those risks and complications associated with the medications. These include, but are not limited to: allergic reactions (i.e.: anaphylactic or anaphylactoid reaction(s)); adrenal axis suppression; blood sugar elevation that in diabetics may result in ketoacidosis or comma; water retention that in patients with history of congestive heart failure may result in shortness of breath, pulmonary edema, and decompensation with resultant heart failure; weight gain; swelling or edema; medication-induced neural toxicity; particulate matter embolism and blood vessel occlusion with resultant organ, and/or nervous system infarction; and/or aseptic necrosis of one or more joints. Finally, the patient was informed that Medicine is not an exact science; therefore, there is also the possibility of unforeseen or unpredictable risks and/or possible complications that may result in a catastrophic outcome. The patient indicated having understood very clearly. We have given the patient no guarantees and we have made no promises. Enough time was given to the patient to ask questions, all of which were answered to the patient's satisfaction. Mr. Kell has indicated that he wanted to continue with the procedure. Attestation: I, the ordering provider, attest that I have discussed with the patient the benefits, risks, side-effects, alternatives, likelihood of achieving goals, and potential problems during recovery for the procedure that I have provided informed consent. Date  Time: 02/17/2020 10:42  AM  Pre-Procedure Preparation:  Monitoring: As per clinic protocol. Respiration, ETCO2, SpO2, BP, heart rate and rhythm monitor placed and checked for adequate function Safety Precautions: Patient was assessed for positional comfort and pressure points before starting the procedure. Time-out: I initiated and conducted the "Time-out" before starting the procedure, as per protocol. The patient was asked to participate by confirming the accuracy of the "Time Out" information. Verification of the correct person, site, and procedure were performed and confirmed by me, the nursing staff, and the patient. "Time-out" conducted as per Joint Commission's Universal Protocol (UP.01.01.01). Time: 1120  Description of Procedure:          Target Area: The interlaminar space, initially targeting the lower laminar border of the superior vertebral body. Approach: Paramedial approach. Area Prepped: Entire Posterior Lumbar Region DuraPrep (Iodine Povacrylex [0.7% available iodine] and Isopropyl Alcohol, 74% w/w) Safety Precautions: Aspiration looking for blood return was conducted prior to all injections. At no point did we inject any substances, as a needle was being advanced. No attempts were made at seeking any paresthesias. Safe injection practices and needle disposal techniques used. Medications properly checked for expiration dates. SDV (single dose vial) medications used. Description of the Procedure: Protocol guidelines were followed. The procedure needle was introduced through the skin, ipsilateral to the reported pain, and advanced to the target area. Bone was contacted and the needle walked caudad, until the lamina was cleared. The epidural space was identified using "loss-of-resistance technique" with 2-3 ml of PF-NaCl (0.9% NSS), in a 5cc LOR glass syringe.  Vitals:   02/17/20 1117 02/17/20 1121 02/17/20 1124 02/17/20 1127  BP: 133/89 (!) 128/95 (!) 117/93 (!) 133/94  Pulse:      Resp: 19 (!) 21 (!) 23  18  Temp:      TempSrc:      SpO2: 98% 99% 98% 100%  Weight:      Height:  Start Time: 1120 hrs. End Time: 1126 hrs.  Materials:  Needle(s) Type: Epidural needle Gauge: 22G Length: 3.5-in Medication(s): Please see orders for medications and dosing details. 6 cc solution made of 3 cc of preservative-free saline, 2 cc of 0.2% ropivacaine, 1 cc of Decadron 10 mg/cc.  Imaging Guidance (Spinal):          Type of Imaging Technique: Fluoroscopy Guidance (Spinal) Indication(s): Assistance in needle guidance and placement for procedures requiring needle placement in or near specific anatomical locations not easily accessible without such assistance. Exposure Time: Please see nurses notes. Contrast: Before injecting any contrast, we confirmed that the patient did not have an allergy to iodine, shellfish, or radiological contrast. Once satisfactory needle placement was completed at the desired level, radiological contrast was injected. Contrast injected under live fluoroscopy. No contrast complications. See chart for type and volume of contrast used. Fluoroscopic Guidance: I was personally present during the use of fluoroscopy. "Tunnel Vision Technique" used to obtain the best possible view of the target area. Parallax error corrected before commencing the procedure. "Direction-depth-direction" technique used to introduce the needle under continuous pulsed fluoroscopy. Once target was reached, antero-posterior, oblique, and lateral fluoroscopic projection used confirm needle placement in all planes. Images permanently stored in EMR. Interpretation: I personally interpreted the imaging intraoperatively. Adequate needle placement confirmed in multiple planes. Appropriate spread of contrast into desired area was observed. No evidence of afferent or efferent intravascular uptake. No intrathecal or subarachnoid spread observed. Permanent images saved into the patient's record.  Antibiotic  Prophylaxis:   Anti-infectives (From admission, onward)   None     Indication(s): None identified  Post-operative Assessment:  Post-procedure Vital Signs:  Pulse/HCG Rate: 9886 Temp: 98.1 F (36.7 C) Resp: 18 BP: (!) 133/94 SpO2: 100 %  EBL: None  Complications: No immediate post-treatment complications observed by team, or reported by patient.  Note: The patient tolerated the entire procedure well. A repeat set of vitals were taken after the procedure and the patient was kept under observation following institutional policy, for this type of procedure. Post-procedural neurological assessment was performed, showing return to baseline, prior to discharge. The patient was provided with post-procedure discharge instructions, including a section on how to identify potential problems. Should any problems arise concerning this procedure, the patient was given instructions to immediately contact us, at any time, without hesitation. In any case, we plan to contact the patient by telephone for a follow-up status report regarding this interventional procedure.  Comments:  No additional relevant information.  5 out of 5 strength bilateral lower extremity: Plantar flexion, dorsiflexion, knee flexion, knee extension.   Plan of Care  Orders:  Orders Placed This Encounter  Procedures  . DG PAIN CLINIC C-ARM 1-60 MIN NO REPORT    Intraoperative interpretation by procedural physician at [facility].    Standing Status:   Standing    Number of Occurrences:   1    Order Specific Question:   Reason for exam:    Answer:   Assistance in needle guidance and placement for procedures requiring needle placement in or near specific anatomical locations not easily accessible without such assistance.   Medications ordered for procedure: Meds ordered this encounter  Medications  . iohexol (OMNIPAQUE) 180 MG/ML injection 10 mL    Must be Myelogram-compatible. If not available, you may  substitute with a water-soluble, non-ionic, hypoallergenic, myelogram-compatible radiological contrast medium.  Marland Kitchen lidocaine (XYLOCAINE) 2 % (with pres) injection 400 mg  . ropivacaine (PF) 2 mg/mL (  0.2%) (NAROPIN) injection 2 mL  . sodium chloride flush (NS) 0.9 % injection 2 mL  . dexamethasone (DECADRON) injection 10 mg   Medications administered: We administered iohexol, lidocaine, ropivacaine (PF) 2 mg/mL (0.2%), sodium chloride flush, and dexamethasone.  See the medical record for exact dosing, route, and time of administration.  Follow-up plan:   Return in about 4 weeks (around 03/16/2020) for Post Procedure Evaluation, virtual.      left L4/5 ESI #1, 6 cc injected    Recent Visits Date Type Provider Dept  12/24/19 Office Visit Gillis Santa, MD [facility]  Showing recent visits within past 90 days and meeting all other requirements Today's Visits Date Type Provider Dept  02/17/20 Procedure visit Gillis Santa, MD [facility]  Showing today's visits and meeting all other requirements Future Appointments No visits were found meeting these conditions. Showing future appointments within next 90 days and meeting all other requirements  Disposition: Discharge home  Discharge (Date  Time): 02/17/2020; 1142 hrs.   Primary Care Physician: Valerie Roys, DO Location: [facility] Note by: Gillis Santa, MD Date: 02/17/2020; Time: 11:56 AM  Disclaimer:  Medicine is not an exact science. The only guarantee in medicine is that nothing is guaranteed. It is important to note that the decision to proceed with this intervention was based on the information collected from the patient. The Data and conclusions were drawn from the patient's questionnaire, the interview, and the physical examination. Because the information was provided in large part by the patient, it cannot be guaranteed that it has not been purposely or unconsciously  manipulated. Every effort has been made to obtain as much relevant data as possible for this evaluation. It is important to note that the conclusions that lead to this procedure are derived in large part from the available data. Always take into account that the treatment will also be dependent on availability of resources and existing treatment guidelines, considered by other Pain Management Practitioners as being common knowledge and practice, at the time of the intervention. For Medico-Legal purposes, it is also important to point out that variation in procedural techniques and pharmacological choices are the acceptable norm. The indications, contraindications, technique, and results of the above procedure should only be interpreted and judged by a Board-Certified Interventional Pain Specialist with extensive familiarity and expertise in the same exact procedure and technique.

## 2020-02-17 NOTE — Patient Instructions (Signed)

## 2020-02-18 ENCOUNTER — Telehealth: Payer: Self-pay

## 2020-02-18 NOTE — Telephone Encounter (Signed)
Post procedure follow up call.  LM 

## 2020-02-23 ENCOUNTER — Telehealth: Payer: Self-pay

## 2020-02-23 NOTE — Telephone Encounter (Signed)
Patient informed on how to schedule a COVID test and that if he develops symptoms to call and scheduled a visit.

## 2020-02-23 NOTE — Telephone Encounter (Signed)
Copied from CRM [id number]. Topic: General - Inquiry >> Feb 23, 2020 12:34 PM Daphine Deutscher D wrote: Reason for CRM: Pt called saying he ws at a gathering christmas eve and now three people have tested positive for covid.  He has not symptoms and wants to know what he should do.   Pt stated he is having no SX does he need apt or would order just be sent as he states he feels fine just wanted to get tested due to exposure.  C#  [phone]

## 2020-02-29 ENCOUNTER — Other Ambulatory Visit: Payer: Self-pay | Admitting: Family Medicine

## 2020-03-14 ENCOUNTER — Encounter: Payer: Self-pay | Admitting: Student in an Organized Health Care Education/Training Program

## 2020-03-14 ENCOUNTER — Telehealth: Payer: Self-pay

## 2020-03-14 NOTE — Telephone Encounter (Signed)
Attempted to call patient for pre virtual appointment questions.  LM to call office in the am.

## 2020-03-15 ENCOUNTER — Other Ambulatory Visit: Payer: Self-pay

## 2020-03-15 ENCOUNTER — Encounter: Payer: Self-pay | Admitting: Student in an Organized Health Care Education/Training Program

## 2020-03-15 ENCOUNTER — Ambulatory Visit
Payer: 59 | Attending: Student in an Organized Health Care Education/Training Program | Admitting: Student in an Organized Health Care Education/Training Program

## 2020-03-15 DIAGNOSIS — M48061 Spinal stenosis, lumbar region without neurogenic claudication: Secondary | ICD-10-CM | POA: Diagnosis not present

## 2020-03-15 DIAGNOSIS — G894 Chronic pain syndrome: Secondary | ICD-10-CM | POA: Diagnosis not present

## 2020-03-15 DIAGNOSIS — M5416 Radiculopathy, lumbar region: Secondary | ICD-10-CM | POA: Diagnosis not present

## 2020-03-15 DIAGNOSIS — G8929 Other chronic pain: Secondary | ICD-10-CM | POA: Diagnosis not present

## 2020-03-15 NOTE — Progress Notes (Signed)
Patient: Chase Hull  Service Category: E/M  Provider: Gillis Santa, MD  DOB: [birth year]  DOS: 03/15/2020  Location: Office  MRN: [id number]  Setting: Ambulatory outpatient  Referring Provider: Valerie Roys, DO  Type: Established Patient  Specialty: Interventional Pain Management  PCP: Valerie Roys, DO  Location: Home  Delivery: TeleHealth     Virtual Encounter - Pain Management PROVIDER NOTE: Information contained herein reflects review and annotations entered in association with encounter. Interpretation of such information and data should be left to medically-trained personnel. Information provided to patient can be located elsewhere in the medical record under "Patient Instructions". Document created using STT-dictation technology, any transcriptional errors that may result from process are unintentional.    Contact & Pharmacy Preferred: [phone] Home: [phone] (home) Mobile: There is no such number on file (mobile). E-mail: [email or web address]  [id number] [place] [address]Phone: [phone]: [phone] [facility] [id number] [place] [place]Phone: [phone]: [phone] [facility] [place]: [phone]: [phone] Pre-screening  Chase Hull "in-person" vs "virtual" encounter. He indicated preferring virtual for this encounter.   Reason COVID-19*  Social distancing based on CDC and AMA recommendations.   I contacted Chase Hull 03/15/2020 via video conference.      I clearly identified myself as BiGillis SantaMD. I verified that I was speaking with the correct person using two identifiers (Name: Chase Ihnenand date of birth: 1116-Jun-1967  Consent I sought verbal advanced consent from Chase Hewsor  virtual visit interactions. I informed Chase Hull possible security and privacy concerns, risks, and limitations associated with providing "not-in-person" medical evaluation and management services. I also informed Chase Hull the availability of "in-person" appointments. Finally, I informed him that there would be a charge for the virtual visit and that he could be  personally, fully or partially, financially responsible for it. Mr. EnLumleyxpressed understanding and agreed to proceed.   Historic Elements   Chase Hull a 89.o. year old, male patient evaluated today after our last contact on 02/17/2020. Chase Hull a past medical history of Carpal tunnel syndrome, Diabetes mellitus without complication ([place]out, and Hypertension. He also  has a past surgical history that includes Left hand surgery (03/2018); Colonoscopy with propofol (N/A, 03/30/2019); and polypectomy (N/A, 03/30/2019). Chase Hull a current medication list which includes the following prescription(s): allopurinol, amlodipine, aspirin, fifty50 glucose meter 2.0, hydrocodone-acetaminophen, lisinopril, naproxen, and nortriptyline. He  reports that he has been smoking cigarettes. He has a 12.50 pack-year smoking history. He has never used smokeless tobacco. He reports previous alcohol use. He reports that he does not use drugs. Chase Hull allergic to gabapentin.   HPI  Today, he is being contacted for a post-procedure assessment.   Post-Procedure Evaluation  Procedure (02/17/2020):  Type: Diagnostic Inter-Laminar Epidural Steroid Injection  #1  Region: Lumbar Level: L4-5 Level. Laterality: Left-Sided        Sedation: Please see nurses note.  Effectiveness during initial hour after procedure(Ultra-Short Term Relief): 70 %   Local anesthetic used: Long-acting (4-6 hours) Effectiveness: Defined as any analgesic benefit obtained secondary to the administration of local anesthetics. This carries significant diagnostic  value as  to the etiological location, or anatomical origin, of the pain. Duration of benefit is expected to coincide with the duration of the local anesthetic used.  Effectiveness during initial 4-6 hours after procedure(Short-Term Relief): 70 %   Long-term benefit: Defined as any relief past the pharmacologic duration of the local anesthetics.  Effectiveness past the initial 6 hours after procedure(Long-Term Relief): 70 % (lasting 4 days)  Current benefits: Defined as benefit that persist at this time.   Analgesia:  <50% better Function: Back to baseline ROM: Back to baseline   Laboratory Chemistry Profile   Renal Lab Results  Component Value Date   BUN 16 10/21/2019   CREATININE 0.95 10/21/2019   BCR 17 10/21/2019   GFRAA 107 10/21/2019   GFRNONAA 92 10/21/2019     Hepatic Lab Results  Component Value Date   AST 27 10/21/2019   ALT 37 10/21/2019   ALBUMIN 4.2 10/21/2019   ALKPHOS 123 (H) 10/21/2019     Electrolytes Lab Results  Component Value Date   NA 140 10/21/2019   K 4.2 10/21/2019   CL 101 10/21/2019   CALCIUM 10.0 10/21/2019     Bone No results found for: VD25OH, VD125OH2TOT, ZW2585ID7, OE4235TI1, 25OHVITD1, 25OHVITD2, 25OHVITD3, TESTOFREE, TESTOSTERONE   Inflammation (CRP: Acute Phase) (ESR: Chronic Phase) No results found for: CRP, ESRSEDRATE, LATICACIDVEN     Note: Above Lab results reviewed.  Assessment  The primary encounter diagnosis was Chronic radicular lumbar pain (left chronic, right new). 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  Plan of Care   Chase Hull has a current medication list which includes the following long-term medication(s): allopurinol, amlodipine, lisinopril, and nortriptyline.  Repeat left L4-L5 ESI #2, we will try and increase volume. Orders:  Orders Placed This Encounter  Procedures   . Lumbar Epidural Injection    Standing Status:   Future    Standing Expiration Date:   04/15/2020    Scheduling Instructions:     Procedure: Interlaminar Lumbar Epidural Steroid injection (LESI)   LEFT L4/5 ESI #2         Laterality: Midline     Sedation: without     Timeframe: ASAA    Order Specific Question:   Where will this procedure be performed?    Answer:   [facility]   Follow-up plan:   Return in about 2 weeks (around 03/29/2020) for Left L4/5 ESI #2 , without sedation.     left L4/5 ESI #1, 6 cc injected, 75% pain relief for approximately 1 week with gradual return of pain thereafter.  Recommend repeating and adding volume.     Recent Visits Date Type Provider Dept  02/17/20 Procedure visit Gillis Santa, MD [facility]  12/24/19 Office Visit Gillis Santa, MD [facility]  Showing recent visits within past 90 days and meeting all other requirements Today's Visits Date Type Provider Dept  03/15/20 Telemedicine Gillis Santa, MD [facility]  Showing today's visits and meeting all other requirements Future Appointments No visits were found meeting these conditions. Showing future appointments within next 90 days and meeting all other requirements  I discussed the assessment and treatment plan with the patient. The patient was provided an opportunity to ask questions and all were answered. The patient agreed with the plan and demonstrated an understanding of the instructions.  Patient advised to call back or seek an in-person evaluation if  the symptoms or condition worsens.  Duration of encounter: 1mnutes.  Note by: BGillis Santa MD Date: 03/15/2020; Time: 3:03 PM

## 2020-03-21 ENCOUNTER — Telehealth: Payer: Self-pay

## 2020-03-21 NOTE — Telephone Encounter (Signed)
Peer to Peer tomorrow, 1/25 at 0830am,.That was the earliest that they would schedule. IT will be a Dr. Festus Aloe. Calling your cell phone, and I gave him your office phone as an alternative number.

## 2020-03-21 NOTE — Telephone Encounter (Signed)
Insurance denied second lesi, said based on clinical documentation, it does not meet medical necessity. I called the patient and left a vm letting him know.

## 2020-04-04 ENCOUNTER — Encounter: Payer: Self-pay | Admitting: Student in an Organized Health Care Education/Training Program

## 2020-04-04 ENCOUNTER — Ambulatory Visit
Admission: RE | Admit: 2020-04-04 | Discharge: 2020-04-04 | Disposition: A | Discharge status: Home / Self Care | Payer: 59 | Source: Ambulatory Visit | Attending: Student in an Organized Health Care Education/Training Program | Admitting: Student in an Organized Health Care Education/Training Program

## 2020-04-04 ENCOUNTER — Other Ambulatory Visit: Payer: Self-pay

## 2020-04-04 ENCOUNTER — Ambulatory Visit (HOSPITAL_BASED_OUTPATIENT_CLINIC_OR_DEPARTMENT_OTHER): Payer: 59 | Admitting: Student in an Organized Health Care Education/Training Program

## 2020-04-04 DIAGNOSIS — G894 Chronic pain syndrome: Secondary | ICD-10-CM

## 2020-04-04 DIAGNOSIS — G8929 Other chronic pain: Secondary | ICD-10-CM

## 2020-04-04 DIAGNOSIS — M48061 Spinal stenosis, lumbar region without neurogenic claudication: Secondary | ICD-10-CM

## 2020-04-04 DIAGNOSIS — M5416 Radiculopathy, lumbar region: Secondary | ICD-10-CM

## 2020-04-04 MED ORDER — SODIUM CHLORIDE 0.9% FLUSH
3.0000 mL | Freq: Once | INTRAVENOUS | Status: AC
  Administered 2020-04-04: 3 mL

## 2020-04-04 MED ORDER — ROPIVACAINE HCL 2 MG/ML IJ SOLN
3.0000 mL | Freq: Once | INTRAMUSCULAR | Status: AC
  Administered 2020-04-04: 3 mL via EPIDURAL

## 2020-04-04 MED ORDER — ROPIVACAINE HCL 2 MG/ML IJ SOLN
INTRAMUSCULAR | Status: AC
  Filled 2020-04-04: qty 10

## 2020-04-04 MED ORDER — SODIUM CHLORIDE (PF) 0.9 % IJ SOLN
INTRAMUSCULAR | Status: AC
  Filled 2020-04-04: qty 10

## 2020-04-04 MED ORDER — DEXAMETHASONE SODIUM PHOSPHATE 10 MG/ML IJ SOLN
INTRAMUSCULAR | Status: AC
  Filled 2020-04-04: qty 1

## 2020-04-04 MED ORDER — IOHEXOL 180 MG/ML  SOLN
10.0000 mL | Freq: Once | INTRAMUSCULAR | Status: AC
  Administered 2020-04-04: 10 mL via EPIDURAL

## 2020-04-04 MED ORDER — LIDOCAINE HCL 2 % IJ SOLN
20.0000 mL | Freq: Once | INTRAMUSCULAR | Status: AC
  Administered 2020-04-04: 100 mg
  Filled 2020-04-04: qty 20

## 2020-04-04 MED ORDER — DEXAMETHASONE SODIUM PHOSPHATE 10 MG/ML IJ SOLN
10.0000 mg | Freq: Once | INTRAMUSCULAR | Status: AC
  Administered 2020-04-04: 10 mg

## 2020-04-04 NOTE — Progress Notes (Signed)
PROVIDER NOTE: Information contained herein reflects review and annotations entered in association with encounter. Interpretation of such information and data should be left to medically-trained personnel. Information provided to patient can be located elsewhere in the medical record under "Patient Instructions". Document created using STT-dictation technology, any transcriptional errors that may result from process are unintentional.    Patient: Chase Hull  Service Category: Procedure  Provider: Gillis Santa, MD  DOB: [birth year]  DOS: 04/04/2020  Location: [facility]  MRN: [id number]  Setting: Ambulatory - outpatient  Referring Provider: Gillis Santa, MD  Type: Established Patient  Specialty: Interventional Pain Management  PCP: Valerie Roys, DO   Primary Reason for Visit: Interventional Pain Management Treatment. CC: Back Pain  Procedure:          Anesthesia, Analgesia, Anxiolysis:  Type: Diagnostic Inter-Laminar Epidural Steroid Injection  #2  Region: Lumbar Level: L4-5 Level. Laterality: Left-Sided         Type: Local Anesthesia  1-2%  Position: Prone with head of the table was raised to facilitate breathing.   Indications: 1. Chronic radicular lumbar pain (left chronic, right new)   2. Lumbar radiculopathy   3. 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 Chronic pain syndrome    Pain Score: Pre-procedure: 8 /10 Post-procedure: 8 /10   Pre-op H&P Assessment:  Chase Hull is a 55 y.o. (year old), male patient, seen today for interventional treatment. He  has a past surgical history that includes Left hand surgery (03/2018); Colonoscopy with propofol (N/A, 03/30/2019); and polypectomy (N/A, 03/30/2019). Chase Hull has a current medication list which includes the following prescription(s): allopurinol, amlodipine, aspirin, fifty50 glucose meter 2.0, hydrocodone-acetaminophen, lisinopril, naproxen, and nortriptyline. His primarily  concern today is the Back Pain  Initial Vital Signs:  Pulse/HCG Rate: 83ECG Heart Rate: 75 Temp: 98.2 F (36.8 C) Resp: 16 BP: (!) 131/92 SpO2: 99 %  BMI: Estimated body mass index is 30.52 kg/m as calculated from the following:   Height as of this encounter: 6' (1.829 m).   Weight as of this encounter: 225 lb (102.1 kg).  Risk Assessment: Allergies: Reviewed. He is allergic to gabapentin.  Allergy Precautions: None required Coagulopathies: Reviewed. None identified.  Blood-thinner therapy: None at this time Active Infection(s): Reviewed. None identified. Chase Hull is afebrile  Site Confirmation: Chase Hull was asked to confirm the procedure and laterality before marking the site Procedure checklist: Completed Consent: Before the procedure and under the influence of no sedative(s), amnesic(s), or anxiolytics, the patient was informed of the treatment options, risks and possible complications. To fulfill our ethical and legal obligations, as recommended by the American Medical Association's Code of Ethics, I have informed the patient of my clinical impression; the nature and purpose of the treatment or procedure; the risks, benefits, and possible complications of the intervention; the alternatives, including doing nothing; the risk(s) and benefit(s) of the alternative treatment(s) or procedure(s); and the risk(s) and benefit(s) of doing nothing. The patient was provided information about the general risks and possible complications associated with the procedure. These may include, but are not limited to: failure to achieve desired goals, infection, bleeding, organ or nerve damage, allergic reactions, paralysis, and death. In addition, the patient was informed of those risks and complications associated to Spine-related procedures, such as failure to decrease pain; infection (i.e.: Meningitis, epidural or intraspinal abscess); bleeding (i.e.: epidural hematoma, subarachnoid hemorrhage, or any  other type of intraspinal or peri-dural bleeding); organ or nerve damage (  i.e.: Any type of peripheral nerve, nerve root, or spinal cord injury) with subsequent damage to sensory, motor, and/or autonomic systems, resulting in permanent pain, numbness, and/or weakness of one or several areas of the body; allergic reactions; (i.e.: anaphylactic reaction); and/or death. Furthermore, the patient was informed of those risks and complications associated with the medications. These include, but are not limited to: allergic reactions (i.e.: anaphylactic or anaphylactoid reaction(s)); adrenal axis suppression; blood sugar elevation that in diabetics may result in ketoacidosis or comma; water retention that in patients with history of congestive heart failure may result in shortness of breath, pulmonary edema, and decompensation with resultant heart failure; weight gain; swelling or edema; medication-induced neural toxicity; particulate matter embolism and blood vessel occlusion with resultant organ, and/or nervous system infarction; and/or aseptic necrosis of one or more joints. Finally, the patient was informed that Medicine is not an exact science; therefore, there is also the possibility of unforeseen or unpredictable risks and/or possible complications that may result in a catastrophic outcome. The patient indicated having understood very clearly. We have given the patient no guarantees and we have made no promises. Enough time was given to the patient to ask questions, all of which were answered to the patient's satisfaction. Chase Hull has indicated that he wanted to continue with the procedure. Attestation: I, the ordering provider, attest that I have discussed with the patient the benefits, risks, side-effects, alternatives, likelihood of achieving goals, and potential problems during recovery for the procedure that I have provided informed consent. Date  Time: 04/04/2020 10:51 AM  Pre-Procedure Preparation:   Monitoring: As per clinic protocol. Respiration, ETCO2, SpO2, BP, heart rate and rhythm monitor placed and checked for adequate function Safety Precautions: Patient was assessed for positional comfort and pressure points before starting the procedure. Time-out: I initiated and conducted the "Time-out" before starting the procedure, as per protocol. The patient was asked to participate by confirming the accuracy of the "Time Out" information. Verification of the correct person, site, and procedure were performed and confirmed by me, the nursing staff, and the patient. "Time-out" conducted as per Joint Commission's Universal Protocol (UP.01.01.01). Time: 1113  Description of Procedure:          Target Area: The interlaminar space, initially targeting the lower laminar border of the superior vertebral body. Approach: Paramedial approach. Area Prepped: Entire Posterior Lumbar Region DuraPrep (Iodine Povacrylex [0.7% available iodine] and Isopropyl Alcohol, 74% w/w) Safety Precautions: Aspiration looking for blood return was conducted prior to all injections. At no point did we inject any substances, as a needle was being advanced. No attempts were made at seeking any paresthesias. Safe injection practices and needle disposal techniques used. Medications properly checked for expiration dates. SDV (single dose vial) medications used. Description of the Procedure: Protocol guidelines were followed. The procedure needle was introduced through the skin, ipsilateral to the reported pain, and advanced to the target area. Bone was contacted and the needle walked caudad, until the lamina was cleared. The epidural space was identified using "loss-of-resistance technique" with 2-3 ml of PF-NaCl (0.9% NSS), in a 5cc LOR glass syringe.  Vitals:   04/04/20 1100 04/04/20 1111 04/04/20 1116 04/04/20 1120  BP: (!) 131/92 134/84 (!) 122/94 (!) 127/98  Pulse: 83     Resp: 16 16 18 16   Temp: 98.2 F (36.8 C)      TempSrc: Temporal     SpO2: 99% 100% 99% 100%  Weight: 225 lb (102.1 kg)     Height: 6' (1.829  m)       Start Time: 1113 hrs. End Time: 1120 hrs.  Materials:  Needle(s) Type: Epidural needle Gauge: 22G Length: 3.5-in Medication(s): Please see orders for medications and dosing details. 7 cc solution made of 3 cc of preservative-free saline, 3 cc of 0.2% ropivacaine, 1 cc of Decadron 10 mg/cc.  Imaging Guidance (Spinal):          Type of Imaging Technique: Fluoroscopy Guidance (Spinal) Indication(s): Assistance in needle guidance and placement for procedures requiring needle placement in or near specific anatomical locations not easily accessible without such assistance. Exposure Time: Please see nurses notes. Contrast: Before injecting any contrast, we confirmed that the patient did not have an allergy to iodine, shellfish, or radiological contrast. Once satisfactory needle placement was completed at the desired level, radiological contrast was injected. Contrast injected under live fluoroscopy. No contrast complications. See chart for type and volume of contrast used. Fluoroscopic Guidance: I was personally present during the use of fluoroscopy. "Tunnel Vision Technique" used to obtain the best possible view of the target area. Parallax error corrected before commencing the procedure. "Direction-depth-direction" technique used to introduce the needle under continuous pulsed fluoroscopy. Once target was reached, antero-posterior, oblique, and lateral fluoroscopic projection used confirm needle placement in all planes. Images permanently stored in EMR. Interpretation: I personally interpreted the imaging intraoperatively. Adequate needle placement confirmed in multiple planes. Appropriate spread of contrast into desired area was observed. No evidence of afferent or efferent intravascular uptake. No intrathecal or subarachnoid spread observed. Permanent images saved into the patient's  record.  Antibiotic Prophylaxis:   Anti-infectives (From admission, onward)   None     Indication(s): None identified  Post-operative Assessment:  Post-procedure Vital Signs:  Pulse/HCG Rate: 8377 Temp: 98.2 F (36.8 C) Resp: 16 BP: (!) 127/98 SpO2: 100 %  EBL: None  Complications: No immediate post-treatment complications observed by team, or reported by patient.  Note: The patient tolerated the entire procedure well. A repeat set of vitals were taken after the procedure and the patient was kept under observation following institutional policy, for this type of procedure. Post-procedural neurological assessment was performed, showing return to baseline, prior to discharge. The patient was provided with post-procedure discharge instructions, including a section on how to identify potential problems. Should any problems arise concerning this procedure, the patient was given instructions to immediately contact us, at any time, without hesitation. In any case, we plan to contact the patient by telephone for a follow-up status report regarding this interventional procedure.  Comments:  No additional relevant information.  5 out of 5 strength bilateral lower extremity: Plantar flexion, dorsiflexion, knee flexion, knee extension.   Plan of Care  Orders:  Orders Placed This Encounter  Procedures  . DG PAIN [facility] C-ARM 1-60 MIN NO REPORT    Intraoperative interpretation by procedural physician at [facility].    Standing Status:   Standing    Number of Occurrences:   1    Order Specific Question:   Reason for exam:    Answer:   Assistance in needle guidance and placement for procedures requiring needle placement in or near specific anatomical locations not easily accessible without such assistance.   Medications ordered for procedure: Meds ordered this encounter  Medications  . iohexol (OMNIPAQUE) 180 MG/ML injection 10 mL    Must be Myelogram-compatible. If not  available, you may substitute with a water-soluble, non-ionic, hypoallergenic, myelogram-compatible radiological contrast medium.  Marland Kitchen lidocaine (XYLOCAINE) 2 % (with pres) injection 400  mg  . ropivacaine (PF) 2 mg/mL (0.2%) (NAROPIN) injection 3 mL  . sodium chloride flush (NS) 0.9 % injection 3 mL  . dexamethasone (DECADRON) injection 10 mg   Medications administered: We administered iohexol, lidocaine, ropivacaine (PF) 2 mg/mL (0.2%), sodium chloride flush, and dexamethasone.  See the medical record for exact dosing, route, and time of administration.  Follow-up plan:   Return in about 4 weeks (around 05/02/2020) for Post Procedure Evaluation, virtual.      left L4/5 ESI #1, 6 cc injected 03/15/20, #2 04/04/20: 7cc injected    Recent Visits Date Type Provider Dept  03/15/20 Telemedicine Gillis Santa, MD [facility]  02/17/20 Procedure visit Gillis Santa, MD [facility]  Showing recent visits within past 90 days and meeting all other requirements Today's Visits Date Type Provider Dept  04/04/20 Procedure visit Gillis Santa, MD [facility]  Showing today's visits and meeting all other requirements Future Appointments Date Type Provider Dept  04/26/20 Appointment Gillis Santa, MD [facility]  Showing future appointments within next 90 days and meeting all other requirements  Disposition: Discharge home  Discharge (Date  Time): 04/04/2020; 1130 hrs.   Primary Care Physician: Valerie Roys, DO Location: [facility] Note by: Gillis Santa, MD Date: 04/04/2020; Time: 11:31 AM  Disclaimer:  Medicine is not an exact science. The only guarantee in medicine is that nothing is guaranteed. It is important to note that the decision to proceed with this intervention was based on the information collected from the patient. The Data and conclusions were drawn from the patient's questionnaire, the interview, and the physical  examination. Because the information was provided in large part by the patient, it cannot be guaranteed that it has not been purposely or unconsciously manipulated. Every effort has been made to obtain as much relevant data as possible for this evaluation. It is important to note that the conclusions that lead to this procedure are derived in large part from the available data. Always take into account that the treatment will also be dependent on availability of resources and existing treatment guidelines, considered by other Pain Management Practitioners as being common knowledge and practice, at the time of the intervention. For Medico-Legal purposes, it is also important to point out that variation in procedural techniques and pharmacological choices are the acceptable norm. The indications, contraindications, technique, and results of the above procedure should only be interpreted and judged by a Board-Certified Interventional Pain Specialist with extensive familiarity and expertise in the same exact procedure and technique.

## 2020-04-04 NOTE — Patient Instructions (Signed)
Pain Management Discharge Instructions  General Discharge Instructions :  If you need to reach your doctor call: Monday-Friday 8:00 am - 4:00 pm at [phone] or toll free [phone].  After clinic hours [phone] to have operator reach doctor.  Bring all of your medication bottles to all your appointments in the pain clinic.  To cancel or reschedule your appointment with Pain Management please remember to call 24 hours in advance to avoid a fee.  Refer to the educational materials which you have been given on: General Risks, I had my Procedure. Discharge Instructions, Post Sedation.  Post Procedure Instructions:  The drugs you were given will stay in your system until tomorrow, so for the next 24 hours you should not drive, make any legal decisions or drink any alcoholic beverages.  You may eat anything you prefer, but it is better to start with liquids then soups and crackers, and gradually work up to solid foods.  Please notify your doctor immediately if you have any unusual bleeding, trouble breathing or pain that is not related to your normal pain.  Depending on the type of procedure that was done, some parts of your body may feel week and/or numb.  This usually clears up by tonight or the next day.  Walk with the use of an assistive device or accompanied by an adult for the 24 hours.  You may use ice on the affected area for the first 24 hours.  Put ice in a Ziploc bag and cover with a towel and place against area 15 minutes on 15 minutes off.  You may switch to heat after 24 hours.Epidural Steroid Injection Patient Information  Description: The epidural space surrounds the nerves as they exit the spinal cord.  In some patients, the nerves can be compressed and inflamed by a bulging disc or a tight spinal canal (spinal stenosis).  By injecting steroids into the epidural space, we can bring irritated nerves into direct contact with a potentially helpful medication.  These  steroids act directly on the irritated nerves and can reduce swelling and inflammation which often leads to decreased pain.  Epidural steroids may be injected anywhere along the spine and from the neck to the low back depending upon the location of your pain.   After numbing the skin with local anesthetic (like Novocaine), a small needle is passed into the epidural space slowly.  You may experience a sensation of pressure while this is being done.  The entire block usually last less than 10 minutes.  Conditions which may be treated by epidural steroids:   Low back and leg pain  Neck and arm pain  Spinal stenosis  Post-laminectomy syndrome  Herpes zoster (shingles) pain  Pain from compression fractures  Preparation for the injection:  1. Do not eat any solid food or dairy products within 8 hours of your appointment.  2. You may drink clear liquids up to 3 hours before appointment.  Clear liquids include water, black coffee, juice or soda.  No milk or cream please. 3. You may take your regular medication, including pain medications, with a sip of water before your appointment  Diabetics should hold regular insulin (if taken separately) and take 1/2 normal NPH dos the morning of the procedure.  Carry some sugar containing items with you to your appointment. 4. A driver must accompany you and be prepared to drive you home after your procedure.  5. Bring all your current medications with your. 6. An IV may be inserted and   sedation may be given at the discretion of the physician.   7. A blood pressure cuff, EKG and other monitors will often be applied during the procedure.  Some patients may need to have extra oxygen administered for a short period. 8. You will be asked to provide medical information, including your allergies, prior to the procedure.  We must know immediately if you are taking blood thinners (like Coumadin/Warfarin)  Or if you are allergic to IV iodine contrast (dye). We must  know if you could possible be pregnant.  Possible side-effects:  Bleeding from needle site  Infection (rare, may require surgery)  Nerve injury (rare)  Numbness & tingling (temporary)  Difficulty urinating (rare, temporary)  Spinal headache ( a headache worse with upright posture)  Light -headedness (temporary)  Pain at injection site (several days)  Decreased blood pressure (temporary)  Weakness in arm/leg (temporary)  Pressure sensation in back/neck (temporary)  Call if you experience:  Fever/chills associated with headache or increased back/neck pain.  Headache worsened by an upright position.  New onset weakness or numbness of an extremity below the injection site  Hives or difficulty breathing (go to the emergency room)  Inflammation or drainage at the infection site  Severe back/neck pain  Any new symptoms which are concerning to you  Please note:  Although the local anesthetic injected can often make your back or neck feel good for several hours after the injection, the pain will likely return.  It takes 3-7 days for steroids to work in the epidural space.  You may not notice any pain relief for at least that one week.  If effective, we will often do a series of three injections spaced 3-6 weeks apart to maximally decrease your pain.  After the initial series, we generally will wait several months before considering a repeat injection of the same type.  If you have any questions, please call [phone] [facility] 

## 2020-04-04 NOTE — Progress Notes (Signed)
Safety precautions to be maintained throughout the outpatient stay will include: orient to surroundings, keep bed in low position, maintain call bell within reach at all times, provide assistance with transfer out of bed and ambulation.  

## 2020-04-05 ENCOUNTER — Telehealth: Payer: Self-pay | Admitting: *Deleted

## 2020-04-05 NOTE — Telephone Encounter (Signed)
No problems post procedure. 

## 2020-04-22 ENCOUNTER — Other Ambulatory Visit: Payer: Self-pay

## 2020-04-22 ENCOUNTER — Ambulatory Visit (INDEPENDENT_AMBULATORY_CARE_PROVIDER_SITE_OTHER): Payer: 59 | Admitting: Family Medicine

## 2020-04-22 ENCOUNTER — Encounter: Payer: Self-pay | Admitting: Family Medicine

## 2020-04-22 VITALS — BP 125/87 | HR 84 | Temp 98.4°F | Wt 234.4 lb

## 2020-04-22 DIAGNOSIS — E119 Type 2 diabetes mellitus without complications: Secondary | ICD-10-CM

## 2020-04-22 DIAGNOSIS — M5137 Other intervertebral disc degeneration, lumbosacral region: Secondary | ICD-10-CM | POA: Diagnosis not present

## 2020-04-22 DIAGNOSIS — I10 Essential (primary) hypertension: Secondary | ICD-10-CM | POA: Diagnosis not present

## 2020-04-22 DIAGNOSIS — E782 Mixed hyperlipidemia: Secondary | ICD-10-CM

## 2020-04-22 LAB — BAYER DCA HB A1C WAIVED: HB A1C (BAYER DCA - WAIVED): 6.8 % (ref <7.0)

## 2020-04-22 LAB — MICROALBUMIN, URINE WAIVED
Creatinine, Urine Waived: 300 mg/dL (ref 10–300)
Microalb, Ur Waived: 80 mg/L — ABNORMAL HIGH (ref 0–19)
Microalb/Creat Ratio: <30 mg/g (ref <30)

## 2020-04-22 MED ORDER — AMLODIPINE BESYLATE 10 MG PO TABS: 10.0000 mg | ORAL_TABLET | Freq: Every day | ORAL | 1 refills | Status: DC

## 2020-04-22 MED ORDER — KETOROLAC TROMETHAMINE 60 MG/2ML IM SOLN
60.0000 mg | Freq: Once | INTRAMUSCULAR | Status: AC
  Administered 2020-04-22: 60 mg via INTRAMUSCULAR

## 2020-04-22 MED ORDER — LISINOPRIL 20 MG PO TABS: 20.0000 mg | ORAL_TABLET | Freq: Every day | ORAL | 1 refills | Status: DC

## 2020-04-22 MED ORDER — NAPROXEN 500 MG PO TABS: 500.0000 mg | ORAL_TABLET | Freq: Two times a day (BID) | ORAL | 3 refills | Status: DC

## 2020-04-22 NOTE — Assessment & Plan Note (Signed)
Under good control on current regimen. Continue current regimen. Continue to monitor. Call with any concerns. Labs drawn today.  

## 2020-04-22 NOTE — Assessment & Plan Note (Signed)
Under good control on current regimen. Continue current regimen. Continue to monitor. Call with any concerns. Refills given. Labs drawn today.   

## 2020-04-22 NOTE — Progress Notes (Signed)
BP 125/87   Pulse 84   Temp 98.4 F (36.9 C)   Wt 234 lb 6.4 oz (106.3 kg)   SpO2 98%   BMI 31.79 kg/m    Subjective:    Patient ID: Chase Hull, male    DOB: 1965-10-23, 55 y.o.   MRN: [id number]  HPI: Chase Hull is a 55 y.o. male  Chief Complaint  Patient presents with  . Diabetes  . Hypertension  . Hyperlipidemia   Back pain is not doing any better. Going on 3 epidural shots without benefit.   HYPERTENSION / HYPERLIPIDEMIA Satisfied with current treatment? yes Duration of hypertension: chronic BP monitoring frequency: not checking BP medication side effects: no Past BP meds: amlodipine, lisinopril Duration of hyperlipidemia: chronic Cholesterol medication side effects: not on anything Cholesterol supplements: none Past cholesterol medications: none Medication compliance: excellent compliance Aspirin: yes Recent stressors: no Recurrent headaches: no Visual changes: no Palpitations: no Dyspnea: no Chest pain: no Lower extremity edema: no Dizzy/lightheaded: no  DIABETES Hypoglycemic episodes:no Polydipsia/polyuria: no Visual disturbance: no Chest pain: no Paresthesias: yes  Glucose Monitoring: yes  Accucheck frequency: Daily  Fasting glucose: 90-100 Taking Insulin?: no Blood Pressure Monitoring: not checking Retinal Examination: Not up to Date Foot Exam: Up to Date Diabetic Education: Completed Pneumovax: Up to Date Influenza: Up to Date Aspirin: yes  Relevant past medical, surgical, family and social history reviewed and updated as indicated. Interim medical history since our last visit reviewed. Allergies and medications reviewed and updated.  Review of Systems  Constitutional: Negative.   Respiratory: Negative.   Cardiovascular: Negative.   Gastrointestinal: Negative.   Musculoskeletal: Negative.   Neurological: Negative.   Psychiatric/Behavioral: Negative.     Per HPI unless specifically indicated above     Objective:    BP  125/87   Pulse 84   Temp 98.4 F (36.9 C)   Wt 234 lb 6.4 oz (106.3 kg)   SpO2 98%   BMI 31.79 kg/m   Wt Readings from Last 3 Encounters:  04/22/20 234 lb 6.4 oz (106.3 kg)  04/04/20 225 lb (102.1 kg)  02/17/20 230 lb (104.3 kg)    Physical Exam Vitals and nursing note reviewed.  Constitutional:      General: He is not in acute distress.    Appearance: Normal appearance. He is not ill-appearing, toxic-appearing or diaphoretic.  HENT:     Head: Normocephalic and atraumatic.     Right Ear: External ear normal.     Left Ear: External ear normal.     Nose: Nose normal.     Mouth/Throat:     Mouth: Mucous membranes are moist.     Pharynx: Oropharynx is clear.  Eyes:     General: No scleral icterus.       Right eye: No discharge.        Left eye: No discharge.     Extraocular Movements: Extraocular movements intact.     Conjunctiva/sclera: Conjunctivae normal.     Pupils: Pupils are equal, round, and reactive to light.  Cardiovascular:     Rate and Rhythm: Normal rate and regular rhythm.     Pulses: Normal pulses.     Heart sounds: Normal heart sounds. No murmur heard. No friction rub. No gallop.   Pulmonary:     Effort: Pulmonary effort is normal. No respiratory distress.     Breath sounds: Normal breath sounds. No stridor. No wheezing, rhonchi or rales.  Chest:     Chest wall: No tenderness.  Musculoskeletal:        General: Normal range of motion.     Cervical back: Normal range of motion and neck supple.  Skin:    General: Skin is warm and dry.     Capillary Refill: Capillary refill takes less than 2 seconds.     Coloration: Skin is not jaundiced or pale.     Findings: No bruising, erythema, lesion or rash.  Neurological:     General: No focal deficit present.     Mental Status: He is alert and oriented to person, place, and time. Mental status is at baseline.  Psychiatric:        Mood and Affect: Mood normal.        Behavior: Behavior normal.        Thought  Content: Thought content normal.        Judgment: Judgment normal.     Results for orders placed or performed in visit on 12/03/19  TSH  Result Value Ref Range   TSH 0.432 (L) 0.450 - 4.500 uIU/mL  CBC with Differential/Platelet  Result Value Ref Range   WBC 14.5 (H) 3.4 - 10.8 x10E3/uL   RBC 4.50 4.14 - 5.80 x10E6/uL   Hemoglobin 13.7 13.0 - 17.7 g/dL   Hematocrit 40.0 37.5 - 51.0 %   MCV 89 79 - 97 fL   MCH 30.4 26.6 - 33.0 pg   MCHC 34.3 31.5 - 35.7 g/dL   RDW 13.0 11.6 - 15.4 %   Platelets 350 150 - 450 x10E3/uL   Neutrophils 65 Not Estab. %   Lymphs 27 Not Estab. %   Monocytes 6 Not Estab. %   Eos 1 Not Estab. %   Basos 0 Not Estab. %   Neutrophils Absolute 9.6 (H) 1.4 - 7.0 x10E3/uL   Lymphocytes Absolute 3.9 (H) 0.7 - 3.1 x10E3/uL   Monocytes Absolute 0.8 0.1 - 0.9 x10E3/uL   EOS (ABSOLUTE) 0.1 0.0 - 0.4 x10E3/uL   Basophils Absolute 0.0 0.0 - 0.2 x10E3/uL   Immature Granulocytes 1 Not Estab. %   Immature Grans (Abs) 0.1 0.0 - 0.1 x10E3/uL      Assessment & Plan:   Problem List Items Addressed This Visit      Cardiovascular and Mediastinum   Hypertension    Under good control on current regimen. Continue current regimen. Continue to monitor. Call with any concerns. Refills given. Labs drawn today.        Relevant Medications   lisinopril (ZESTRIL) 20 MG tablet   amLODipine (NORVASC) 10 MG tablet   Other Relevant Orders   CBC with Differential/Platelet   Comprehensive metabolic panel   Microalbumin, Urine Waived     Endocrine   Diabetes mellitus without complication ([place]) - Primary    Up slightly due to steroid shots. Will continue diet and exercise and recheck 6 months.       Relevant Medications   lisinopril (ZESTRIL) 20 MG tablet   Other Relevant Orders   CBC with Differential/Platelet   Comprehensive metabolic panel   Bayer DCA Hb A1c Waived   Microalbumin, Urine Waived     Musculoskeletal and Integument   DDD (degenerative disc disease),  lumbosacral    Acting up. Continue to follow with pain management. Toradol shot given today. If not getting better with the epidural shot, will call and we will refer to neurosurgery. Call with any concerns.       Relevant Medications   ketorolac (TORADOL) injection 60 mg   naproxen (NAPROSYN)  500 MG tablet     Other   Hyperlipidemia    Under good control on current regimen. Continue current regimen. Continue to monitor. Call with any concerns. Labs drawn today.        Relevant Medications   lisinopril (ZESTRIL) 20 MG tablet   amLODipine (NORVASC) 10 MG tablet   Other Relevant Orders   CBC with Differential/Platelet   Comprehensive metabolic panel   Lipid Panel w/o Chol/HDL Ratio       Follow up plan: Return in about 6 months (around 10/20/2020) for physical.

## 2020-04-22 NOTE — Assessment & Plan Note (Signed)
Up slightly due to steroid shots. Will continue diet and exercise and recheck 6 months.

## 2020-04-22 NOTE — Assessment & Plan Note (Signed)
Acting up. Continue to follow with pain management. Toradol shot given today. If not getting better with the epidural shot, will call and we will refer to neurosurgery. Call with any concerns.

## 2020-04-23 LAB — CBC WITH DIFFERENTIAL/PLATELET
Basophils Absolute: 0 10*3/uL (ref 0.0–0.2)
Basos: 1 %
EOS (ABSOLUTE): 0.1 10*3/uL (ref 0.0–0.4)
Eos: 2 %
Hematocrit: 43.8 % (ref 37.5–51.0)
Hemoglobin: 14.9 g/dL (ref 13.0–17.7)
Immature Grans (Abs): 0 10*3/uL (ref 0.0–0.1)
Immature Granulocytes: 0 %
Lymphocytes Absolute: 1.9 10*3/uL (ref 0.7–3.1)
Lymphs: 24 %
MCH: 31.5 pg (ref 26.6–33.0)
MCHC: 34 g/dL (ref 31.5–35.7)
MCV: 93 fL (ref 79–97)
Monocytes Absolute: 0.7 10*3/uL (ref 0.1–0.9)
Monocytes: 8 %
Neutrophils Absolute: 5.4 10*3/uL (ref 1.4–7.0)
Neutrophils: 65 %
Platelets: 307 10*3/uL (ref 150–450)
RBC: 4.73 x10E6/uL (ref 4.14–5.80)
RDW: 13 % (ref 11.6–15.4)
WBC: 8.2 10*3/uL (ref 3.4–10.8)

## 2020-04-23 LAB — COMPREHENSIVE METABOLIC PANEL
ALT: 26 IU/L (ref 0–44)
AST: 21 IU/L (ref 0–40)
Albumin/Globulin Ratio: 1.9 (ref 1.2–2.2)
Albumin: 4.4 g/dL (ref 3.8–4.9)
Alkaline Phosphatase: 120 IU/L (ref 44–121)
BUN/Creatinine Ratio: 13 (ref 9–20)
BUN: 13 mg/dL (ref 6–24)
Bilirubin Total: <0.2 mg/dL (ref 0.0–1.2)
CO2: 21 mmol/L (ref 20–29)
Calcium: 9.5 mg/dL (ref 8.7–10.2)
Chloride: 102 mmol/L (ref 96–106)
Creatinine, Ser: 1.03 mg/dL (ref 0.76–1.27)
GFR calc Af Amer: 96 mL/min/{1.73_m2} (ref >59)
GFR calc non Af Amer: 83 mL/min/{1.73_m2} (ref >59)
Globulin, Total: 2.3 g/dL (ref 1.5–4.5)
Glucose: 141 mg/dL — ABNORMAL HIGH (ref 65–99)
Potassium: 3.4 mmol/L — ABNORMAL LOW (ref 3.5–5.2)
Sodium: 139 mmol/L (ref 134–144)
Total Protein: 6.7 g/dL (ref 6.0–8.5)

## 2020-04-23 LAB — LIPID PANEL W/O CHOL/HDL RATIO
Cholesterol, Total: 178 mg/dL (ref 100–199)
HDL: 44 mg/dL (ref >39)
LDL Chol Calc (NIH): 124 mg/dL — ABNORMAL HIGH (ref 0–99)
Triglycerides: 51 mg/dL (ref 0–149)
VLDL Cholesterol Cal: 10 mg/dL (ref 5–40)

## 2020-04-25 ENCOUNTER — Telehealth: Payer: Self-pay | Admitting: *Deleted

## 2020-04-25 NOTE — Telephone Encounter (Signed)
Called for pre visit assessment. No answer. LVM. 

## 2020-04-26 ENCOUNTER — Other Ambulatory Visit: Payer: Self-pay

## 2020-04-26 ENCOUNTER — Ambulatory Visit
Payer: 59 | Attending: Student in an Organized Health Care Education/Training Program | Admitting: Student in an Organized Health Care Education/Training Program

## 2020-04-26 DIAGNOSIS — G8929 Other chronic pain: Secondary | ICD-10-CM

## 2020-04-26 DIAGNOSIS — M5416 Radiculopathy, lumbar region: Secondary | ICD-10-CM

## 2020-04-26 NOTE — Progress Notes (Signed)
I attempted to call the patient however no response.  -Dr Lateef  

## 2020-05-04 ENCOUNTER — Telehealth: Payer: Self-pay | Admitting: *Deleted

## 2020-05-04 NOTE — Telephone Encounter (Signed)
Phone message left to call a nurse prior to Echo on 05/05/20

## 2020-05-05 ENCOUNTER — Ambulatory Visit
Payer: 59 | Attending: Student in an Organized Health Care Education/Training Program | Admitting: Student in an Organized Health Care Education/Training Program

## 2020-05-05 ENCOUNTER — Encounter: Payer: Self-pay | Admitting: Student in an Organized Health Care Education/Training Program

## 2020-05-05 ENCOUNTER — Other Ambulatory Visit: Payer: Self-pay

## 2020-05-05 DIAGNOSIS — M48061 Spinal stenosis, lumbar region without neurogenic claudication: Secondary | ICD-10-CM | POA: Diagnosis not present

## 2020-05-05 DIAGNOSIS — M5416 Radiculopathy, lumbar region: Secondary | ICD-10-CM

## 2020-05-05 DIAGNOSIS — G894 Chronic pain syndrome: Secondary | ICD-10-CM

## 2020-05-05 DIAGNOSIS — G8929 Other chronic pain: Secondary | ICD-10-CM

## 2020-05-05 NOTE — Progress Notes (Signed)
Patient: Chase Hull  Service Category: E/M  Provider: Gillis Santa, MD  DOB: 10-09-1965  DOS: 05/05/2020  Location: Office  MRN: [id number]  Setting: Ambulatory outpatient  Referring Provider: Valerie Roys, DO  Type: Established Patient  Specialty: Interventional Pain Management  PCP: Valerie Roys, DO  Location: Home  Delivery: TeleHealth     Virtual Encounter - Pain Management PROVIDER NOTE: Information contained herein reflects review and annotations entered in association with encounter. Interpretation of such information and data should be left to medically-trained personnel. Information provided to patient can be located elsewhere in the medical record under "Patient Instructions". Document created using STT-dictation technology, any transcriptional errors that may result from process are unintentional.    Contact & Pharmacy Preferred: [phone] Home: [phone] (home) Mobile: There is no such number on file (mobile). E-mail: [email or web address]  [id number] - [place], [place] - [address] [address] Phone: [phone] Fax: [phone]  [id number] [address], [place] [address] Phone: [phone] Fax: [phone]  [id number] [address], [place] [address] Phone: [phone] Fax: [phone]   Pre-screening  Chase Hull offered "in-person" vs "virtual" encounter. He indicated preferring virtual for this encounter.   Reason COVID-19*  Social distancing based on CDC and AMA recommendations.   I contacted Chase Hull on 05/05/2020 via video conference.      I clearly identified myself as Gillis Santa, MD. I verified that I was speaking with the correct person using two identifiers (Name: Chase Hull, and date of birth: 06/07/1965).  Consent I sought verbal advanced consent from Chase Hull for  virtual visit interactions. I informed Chase Hull of possible security and privacy concerns, risks, and limitations associated with providing "not-in-person" medical evaluation and management services. I also informed Chase Hull of the availability of "in-person" appointments. Finally, I informed him that there would be a charge for the virtual visit and that he could be  personally, fully or partially, financially responsible for it. Chase Hull expressed understanding and agreed to proceed.   Historic Elements   Chase Hull is a 55 y.o. year old, male patient evaluated today after our last contact on 04/04/2020. Chase Hull  has a past medical history of Carpal tunnel syndrome, Diabetes mellitus without complication ([place]), Gout, and Hypertension. He also  has a past surgical history that includes Left hand surgery (03/2018); Colonoscopy with propofol (N/A, 03/30/2019); and polypectomy (N/A, 03/30/2019). Chase Hull has a current medication list which includes the following prescription(s): allopurinol, amlodipine, aspirin, fifty50 glucose meter 2.0, lisinopril, naproxen, and hydrocodone-acetaminophen. He  reports that he has been smoking cigarettes. He has a 12.50 pack-year smoking history. He has never used smokeless tobacco. He reports previous alcohol use. He reports that he does not use drugs. Chase Hull is allergic to gabapentin.   HPI  Today, he is being contacted for a post-procedure assessment.  Post-Procedure Evaluation  Procedure (04/04/2020): Left L4/5 ESI #2  Sedation: Please see nurses note.  Effectiveness during initial hour after procedure(Ultra-Short Term Relief): 95 %  Local anesthetic used: Long-acting (4-6 hours) Effectiveness: Defined as any analgesic benefit obtained secondary to the administration of local anesthetics. This carries significant diagnostic value as to the etiological location, or anatomical origin, of the pain. Duration of benefit is expected to coincide with the duration of  the  local anesthetic used.  Effectiveness during initial 4-6 hours after procedure(Short-Term Relief): 95 %   Long-term benefit: Defined as any relief past the pharmacologic duration of the local anesthetics.  Effectiveness past the initial 6 hours after procedure(Long-Term Relief): 95 % (good pain relief for approx 2 weeks, pain has returned to approx the same level of pain as pre-procedure.)   Current benefits: Defined as benefit that persist at this time.   Analgesia:  <50% better Function: Chase Hull reports improvement in function ROM: Back to baseline  Repeat lumbar ESI #3, increase epidural volume Future considerations could include mild for central spinal canal stenosis at L4-L5 as well as referral to neurosurgery  Laboratory Chemistry Profile   Renal Lab Results  Component Value Date   BUN 13 04/22/2020   CREATININE 1.03 04/22/2020   BCR 13 04/22/2020   GFRAA 96 04/22/2020   GFRNONAA 83 04/22/2020     Hepatic Lab Results  Component Value Date   AST 21 04/22/2020   ALT 26 04/22/2020   ALBUMIN 4.4 04/22/2020   ALKPHOS 120 04/22/2020     Electrolytes Lab Results  Component Value Date   NA 139 04/22/2020   K 3.4 (L) 04/22/2020   CL 102 04/22/2020   CALCIUM 9.5 04/22/2020     Bone No results found for: VD25OH, VD125OH2TOT, MG8676PP5, KD3267TI4, 25OHVITD1, 25OHVITD2, 25OHVITD3, TESTOFREE, TESTOSTERONE   Inflammation (CRP: Acute Phase) (ESR: Chronic Phase) No results found for: CRP, ESRSEDRATE, LATICACIDVEN     Note: Above Lab results reviewed.   Assessment  The primary encounter diagnosis was Chronic radicular lumbar pain (left chronic, right new). 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  Plan of Care  Chase Hull has a current medication list which includes the following long-term medication(s): allopurinol, amlodipine,  and lisinopril.   Patient states that getting better response to his last epidural when compared with his first 1.  He states that he experienced a longer duration of pain relief at approximately 3 weeks which he rated as 95% pain relief.  Now he is endorsing return of his pain.  We discussed repeating a third lumbar epidural steroid injection and increasing epidural volume.  Depending upon how he does with this, future considerations could include minimally invasive lumbar decompression at L4-L5 given hypertrophic ligamentum flavum and symptoms consistent with lumbar spinal stenosis and neurogenic claudication.  Referral to neurosurgery may also be a reasonable option depending upon how patient does with his third lumbar epidural steroid injection.  Orders:  Orders Placed This Encounter  Procedures  . Lumbar Epidural Injection    Standing Status:   Future    Standing Expiration Date:   06/05/2020    Scheduling Instructions:     Procedure: Interlaminar Lumbar Epidural Steroid injection (LESI)            L4/5 ASAP w/o sed    Order Specific Question:   Where will this procedure be performed?    Answer:   [facility]   Follow-up plan:   Return in about 11 days (around 05/16/2020) for Left L4-L5 ESI #3, without sedation.     left L4/5 ESI #1, 6 cc injected 03/15/20, #2 04/04/20: 7cc injected     Recent Visits Date Type Provider Dept  04/04/20 Procedure visit Gillis Santa, MD [facility]  03/15/20 Telemedicine Gillis Santa, MD [facility]  02/17/20 Procedure visit Gillis Santa, MD  [facility]  Showing recent visits within past 90 days and meeting all other requirements Today's Visits Date Type Provider Dept  05/05/20 Telemedicine Gillis Santa, MD [facility]  Showing today's visits and meeting all other requirements Future Appointments No visits were found meeting these conditions. Showing future appointments within next 90 days and meeting  all other requirements  I discussed the assessment and treatment plan with the patient. The patient was provided an opportunity to ask questions and all were answered. The patient agreed with the plan and demonstrated an understanding of the instructions.  Patient advised to call back or seek an in-person evaluation if the symptoms or condition worsens.  Duration of encounter: 15 minutes.  Note by: Gillis Santa, MD Date: 05/05/2020; Time: 3:04 PM

## 2020-05-23 ENCOUNTER — Other Ambulatory Visit: Payer: Self-pay

## 2020-05-23 ENCOUNTER — Ambulatory Visit
Admission: RE | Admit: 2020-05-23 | Discharge: 2020-05-23 | Disposition: A | Discharge status: Home / Self Care | Payer: 59 | Source: Ambulatory Visit | Attending: Student in an Organized Health Care Education/Training Program | Admitting: Student in an Organized Health Care Education/Training Program

## 2020-05-23 ENCOUNTER — Ambulatory Visit (HOSPITAL_BASED_OUTPATIENT_CLINIC_OR_DEPARTMENT_OTHER): Payer: 59 | Admitting: Student in an Organized Health Care Education/Training Program

## 2020-05-23 ENCOUNTER — Encounter: Payer: Self-pay | Admitting: Student in an Organized Health Care Education/Training Program

## 2020-05-23 DIAGNOSIS — G894 Chronic pain syndrome: Secondary | ICD-10-CM | POA: Diagnosis not present

## 2020-05-23 DIAGNOSIS — M48061 Spinal stenosis, lumbar region without neurogenic claudication: Secondary | ICD-10-CM | POA: Diagnosis present

## 2020-05-23 DIAGNOSIS — M5416 Radiculopathy, lumbar region: Secondary | ICD-10-CM | POA: Diagnosis not present

## 2020-05-23 DIAGNOSIS — G8929 Other chronic pain: Secondary | ICD-10-CM | POA: Diagnosis not present

## 2020-05-23 MED ORDER — LIDOCAINE HCL 2 % IJ SOLN
20.0000 mL | Freq: Once | INTRAMUSCULAR | Status: AC
  Administered 2020-05-23: 400 mg
  Filled 2020-05-23: qty 40

## 2020-05-23 MED ORDER — ROPIVACAINE HCL 2 MG/ML IJ SOLN
2.0000 mL | Freq: Once | INTRAMUSCULAR | Status: AC
  Administered 2020-05-23: 2 mL via EPIDURAL
  Filled 2020-05-23: qty 10

## 2020-05-23 MED ORDER — DEXAMETHASONE SODIUM PHOSPHATE 10 MG/ML IJ SOLN
10.0000 mg | Freq: Once | INTRAMUSCULAR | Status: AC
  Administered 2020-05-23: 10 mg
  Filled 2020-05-23: qty 1

## 2020-05-23 MED ORDER — IOHEXOL 180 MG/ML  SOLN
10.0000 mL | Freq: Once | INTRAMUSCULAR | Status: AC
  Administered 2020-05-23: 10 mL via EPIDURAL

## 2020-05-23 MED ORDER — SODIUM CHLORIDE 0.9% FLUSH
2.0000 mL | Freq: Once | INTRAVENOUS | Status: AC
  Administered 2020-05-23: 2 mL

## 2020-05-23 NOTE — Progress Notes (Signed)
PROVIDER NOTE: Information contained herein reflects review and annotations entered in association with encounter. Interpretation of such information and data should be left to medically-trained personnel. Information provided to patient can be located elsewhere in the medical record under "Patient Instructions". Document created using STT-dictation technology, any transcriptional errors that may result from process are unintentional.    Patient: Chase Hull  Service Category: Procedure  Provider: Gillis Santa, MD  DOB: 1965-05-13  DOS: 05/23/2020  Location: [facility]  MRN: [id number]  Setting: Ambulatory - outpatient  Referring Provider: Gillis Santa, MD  Type: Established Patient  Specialty: Interventional Pain Management  PCP: Chase Roys, DO   Primary Reason for Visit: Interventional Pain Management Treatment. CC: Back Pain (Lumbar left side is worse. )  Procedure:          Anesthesia, Analgesia, Anxiolysis:  Type: Diagnostic/Theraputic Inter-Laminar Epidural Steroid Injection  #3  Region: Lumbar Level: L4-5 Level. Laterality: Left-Sided         Type: Local Anesthesia  1-2%  Position: Prone with head of the table was raised to facilitate breathing.   Indications: 1. Chronic radicular lumbar pain (left chronic, right new)   2. Lumbar radiculopathy   3. 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 Chronic pain syndrome    Pain Score: Pre-procedure: 9 /10 Post-procedure: 9  (pressure sensation)/10   Pre-op H&P Assessment:  Chase Hull is a 55 y.o. (year old), male patient, seen today for interventional treatment. He  has a past surgical history that includes Left hand surgery (03/2018); Colonoscopy with propofol (N/A, 03/30/2019); and polypectomy (N/A, 03/30/2019). Chase Hull has a current medication list which includes the following prescription(s): allopurinol, amlodipine, aspirin, fifty50 glucose meter 2.0,  hydrocodone-acetaminophen, lisinopril, and naproxen. His primarily concern today is the Back Pain (Lumbar left side is worse. )  Initial Vital Signs:  Pulse/HCG Rate: 84ECG Heart Rate: 76 Temp: (!) 97.3 F (36.3 C) Resp: 16 BP: (!) 134/97 SpO2: 100 %  BMI: Estimated body mass index is 30.52 kg/m as calculated from the following:   Height as of this encounter: 6' (1.829 m).   Weight as of this encounter: 225 lb (102.1 kg).  Risk Assessment: Allergies: Reviewed. He is allergic to gabapentin.  Allergy Precautions: None required Coagulopathies: Reviewed. None identified.  Blood-thinner therapy: None at this time Active Infection(s): Reviewed. None identified. Chase Hull is afebrile  Site Confirmation: Chase Hull was asked to confirm the procedure and laterality before marking the site Procedure checklist: Completed Consent: Before the procedure and under the influence of no sedative(s), amnesic(s), or anxiolytics, the patient was informed of the treatment options, risks and possible complications. To fulfill our ethical and legal obligations, as recommended by the American Medical Association's Code of Ethics, I have informed the patient of my clinical impression; the nature and purpose of the treatment or procedure; the risks, benefits, and possible complications of the intervention; the alternatives, including doing nothing; the risk(s) and benefit(s) of the alternative treatment(s) or procedure(s); and the risk(s) and benefit(s) of doing nothing. The patient was provided information about the general risks and possible complications associated with the procedure. These may include, but are not limited to: failure to achieve desired goals, infection, bleeding, organ or nerve damage, allergic reactions, paralysis, and death. In addition, the patient was informed of those risks and complications associated to Spine-related procedures, such as failure to decrease pain; infection (i.e.: Meningitis,  epidural or intraspinal abscess); bleeding (i.e.: epidural hematoma, subarachnoid  hemorrhage, or any other type of intraspinal or peri-dural bleeding); organ or nerve damage (i.e.: Any type of peripheral nerve, nerve root, or spinal cord injury) with subsequent damage to sensory, motor, and/or autonomic systems, resulting in permanent pain, numbness, and/or weakness of one or several areas of the body; allergic reactions; (i.e.: anaphylactic reaction); and/or death. Furthermore, the patient was informed of those risks and complications associated with the medications. These include, but are not limited to: allergic reactions (i.e.: anaphylactic or anaphylactoid reaction(s)); adrenal axis suppression; blood sugar elevation that in diabetics may result in ketoacidosis or comma; water retention that in patients with history of congestive heart failure may result in shortness of breath, pulmonary edema, and decompensation with resultant heart failure; weight gain; swelling or edema; medication-induced neural toxicity; particulate matter embolism and blood vessel occlusion with resultant organ, and/or nervous system infarction; and/or aseptic necrosis of one or more joints. Finally, the patient was informed that Medicine is not an exact science; therefore, there is also the possibility of unforeseen or unpredictable risks and/or possible complications that may result in a catastrophic outcome. The patient indicated having understood very clearly. We have given the patient no guarantees and we have made no promises. Enough time was given to the patient to ask questions, all of which were answered to the patient's satisfaction. Chase Hull has indicated that he wanted to continue with the procedure. Attestation: I, the ordering provider, attest that I have discussed with the patient the benefits, risks, side-effects, alternatives, likelihood of achieving goals, and potential problems during recovery for the procedure that  I have provided informed consent. Date  Time: 05/23/2020 10:42 AM  Pre-Procedure Preparation:  Monitoring: As per clinic protocol. Respiration, ETCO2, SpO2, BP, heart rate and rhythm monitor placed and checked for adequate function Safety Precautions: Patient was assessed for positional comfort and pressure points before starting the procedure. Time-out: I initiated and conducted the "Time-out" before starting the procedure, as per protocol. The patient was asked to participate by confirming the accuracy of the "Time Out" information. Verification of the correct person, site, and procedure were performed and confirmed by me, the nursing staff, and the patient. "Time-out" conducted as per Joint Commission's Universal Protocol (UP.01.01.01). Time: 1126  Description of Procedure:          Target Area: The interlaminar space, initially targeting the lower laminar border of the superior vertebral body. Approach: Paramedial approach. Area Prepped: Entire Posterior Lumbar Region DuraPrep (Iodine Povacrylex [0.7% available iodine] and Isopropyl Alcohol, 74% w/w) Safety Precautions: Aspiration looking for blood return was conducted prior to all injections. At no point did we inject any substances, as a needle was being advanced. No attempts were made at seeking any paresthesias. Safe injection practices and needle disposal techniques used. Medications properly checked for expiration dates. SDV (single dose vial) medications used. Description of the Procedure: Protocol guidelines were followed. The procedure needle was introduced through the skin, ipsilateral to the reported pain, and advanced to the target area. Bone was contacted and the needle walked caudad, until the lamina was cleared. The epidural space was identified using "loss-of-resistance technique" with 2-3 ml of PF-NaCl (0.9% NSS), in a 5cc LOR glass syringe.  Vitals:   05/23/20 1044 05/23/20 1127 05/23/20 1132  BP: (!) 134/97 (!) 133/96 133/90   Pulse: 84    Resp: 16 18 15   Temp: (!) 97.3 F (36.3 C)    TempSrc: Temporal    SpO2: 100% 100% 98%  Weight: 225 lb (102.1 kg)  Height: 6' (1.829 m)      Start Time: 1126 hrs. End Time: 1133 hrs.  Materials:  Needle(s) Type: Epidural needle Gauge: 22G Length: 3.5-in Medication(s): Please see orders for medications and dosing details. 8 cc solution made of 4 cc of preservative-free saline, 3 cc of 0.2% ropivacaine, 1 cc of Decadron 10 mg/cc.  Imaging Guidance (Spinal):          Type of Imaging Technique: Fluoroscopy Guidance (Spinal) Indication(s): Assistance in needle guidance and placement for procedures requiring needle placement in or near specific anatomical locations not easily accessible without such assistance. Exposure Time: Please see nurses notes. Contrast: Before injecting any contrast, we confirmed that the patient did not have an allergy to iodine, shellfish, or radiological contrast. Once satisfactory needle placement was completed at the desired level, radiological contrast was injected. Contrast injected under live fluoroscopy. No contrast complications. See chart for type and volume of contrast used. Fluoroscopic Guidance: I was personally present during the use of fluoroscopy. "Tunnel Vision Technique" used to obtain the best possible view of the target area. Parallax error corrected before commencing the procedure. "Direction-depth-direction" technique used to introduce the needle under continuous pulsed fluoroscopy. Once target was reached, antero-posterior, oblique, and lateral fluoroscopic projection used confirm needle placement in all planes. Images permanently stored in EMR. Interpretation: I personally interpreted the imaging intraoperatively. Adequate needle placement confirmed in multiple planes. Appropriate spread of contrast into desired area was observed. No evidence of afferent or efferent intravascular uptake. No intrathecal or subarachnoid spread  observed. Permanent images saved into the patient's record.   Post-operative Assessment:  Post-procedure Vital Signs:  Pulse/HCG Rate: 8479 Temp: (!) 97.3 F (36.3 C) Resp: 15 BP: 133/90 SpO2: 98 %  EBL: None  Complications: No immediate post-treatment complications observed by team, or reported by patient.  Note: The patient tolerated the entire procedure well. A repeat set of vitals were taken after the procedure and the patient was kept under observation following institutional policy, for this type of procedure. Post-procedural neurological assessment was performed, showing return to baseline, prior to discharge. The patient was provided with post-procedure discharge instructions, including a section on how to identify potential problems. Should any problems arise concerning this procedure, the patient was given instructions to immediately contact us, at any time, without hesitation. In any case, we plan to contact the patient by telephone for a follow-up status report regarding this interventional procedure.  Comments:  No additional relevant information.  5 out of 5 strength bilateral lower extremity: Plantar flexion, dorsiflexion, knee flexion, knee extension.   Plan of Care  Orders:  Orders Placed This Encounter  Procedures  . DG PAIN [facility]IN[facility] C-ARM 1-60 MIN NO REPORT    Intraoperative interpretation by procedural physician at [place].    Standing Status:   Standing    Number of Occurrences:   1    Order Specific Question:   Reason for exam:    Answer:   Assistance in needle guidance and placement for procedures requiring needle placement in or near specific anatomical locations not easily accessible without such assistance.   Medications ordered for procedure: Meds ordered this encounter  Medications  . iohexol (OMNIPAQUE) 180 MG/ML injection 10 mL    Must be Myelogram-compatible. If not available, you may substitute with a water-soluble, non-ionic,  hypoallergenic, myelogram-compatible radiological contrast medium.  Marland Kitchen lidocaine (XYLOCAINE) 2 % (with pres) injection 400 mg  . ropivacaine (PF) 2 mg/mL (0.2%) (NAROPIN) injection 2 mL  . sodium chloride  flush (NS) 0.9 % injection 2 mL  . dexamethasone (DECADRON) injection 10 mg   Medications administered: We administered iohexol, lidocaine, ropivacaine (PF) 2 mg/mL (0.2%), sodium chloride flush, and dexamethasone.  See the medical record for exact dosing, route, and time of administration.  Follow-up plan:   Return in about 5 weeks (around 06/27/2020) for Post Procedure Evaluation, virtual.      left L4/5 ESI #1, 6 cc injected 03/15/20, #2 04/04/20: 7cc injected, #3 05/23/20 8 cc injected    Recent Visits Date Type Provider Dept  05/05/20 Telemedicine Chase Santa, MD [facility]  04/04/20 Procedure visit Chase Santa, MD [facility]  03/15/20 Telemedicine Chase Santa, MD [facility]  Showing recent visits within past 90 days and meeting all other requirements Today's Visits Date Type Provider Dept  05/23/20 Procedure visit Chase Santa, MD [facility]  Showing today's visits and meeting all other requirements Future Appointments Date Type Provider Dept  06/21/20 Appointment Chase Santa, MD [facility]  Showing future appointments within next 90 days and meeting all other requirements  Disposition: Discharge home  Discharge (Date  Time): 05/23/2020; 1145 hrs.   Primary Care Physician: Chase Roys, DO Location: [facility] Note by: Chase Santa, MD Date: 05/23/2020; Time: 12:03 PM  Disclaimer:  Medicine is not an exact science. The only guarantee in medicine is that nothing is guaranteed. It is important to note that the decision to proceed with this intervention was based on the information collected from the patient. The Data and conclusions were drawn from the patient's questionnaire, the  interview, and the physical examination. Because the information was provided in large part by the patient, it cannot be guaranteed that it has not been purposely or unconsciously manipulated. Every effort has been made to obtain as much relevant data as possible for this evaluation. It is important to note that the conclusions that lead to this procedure are derived in large part from the available data. Always take into account that the treatment will also be dependent on availability of resources and existing treatment guidelines, considered by other Pain Management Practitioners as being common knowledge and practice, at the time of the intervention. For Medico-Legal purposes, it is also important to point out that variation in procedural techniques and pharmacological choices are the acceptable norm. The indications, contraindications, technique, and results of the above procedure should only be interpreted and judged by a Board-Certified Interventional Pain Specialist with extensive familiarity and expertise in the same exact procedure and technique.

## 2020-05-23 NOTE — Patient Instructions (Signed)
Pain Management Discharge Instructions  General Discharge Instructions :  If you need to reach your doctor call: Monday-Friday 8:00 am - 4:00 pm at [phone] or toll free [phone].  After clinic hours [phone] to have operator reach doctor.  Bring all of your medication bottles to all your appointments in the pain clinic.  To cancel or reschedule your appointment with Pain Management please remember to call 24 hours in advance to avoid a fee.  Refer to the educational materials which you have been given on: General Risks, I had my Procedure. Discharge Instructions, Post Sedation.  Post Procedure Instructions:  The drugs you were given will stay in your system until tomorrow, so for the next 24 hours you should not drive, make any legal decisions or drink any alcoholic beverages.  You may eat anything you prefer, but it is better to start with liquids then soups and crackers, and gradually work up to solid foods.  Please notify your doctor immediately if you have any unusual bleeding, trouble breathing or pain that is not related to your normal pain.  Depending on the type of procedure that was done, some parts of your body may feel week and/or numb.  This usually clears up by tonight or the next day.  Walk with the use of an assistive device or accompanied by an adult for the 24 hours.  You may use ice on the affected area for the first 24 hours.  Put ice in a Ziploc bag and cover with a towel and place against area 15 minutes on 15 minutes off.  You may switch to heat after 24 hours.Epidural Steroid Injection Patient Information  Description: The epidural space surrounds the nerves as they exit the spinal cord.  In some patients, the nerves can be compressed and inflamed by a bulging disc or a tight spinal canal (spinal stenosis).  By injecting steroids into the epidural space, we can bring irritated nerves into direct contact with a potentially helpful medication.  These  steroids act directly on the irritated nerves and can reduce swelling and inflammation which often leads to decreased pain.  Epidural steroids may be injected anywhere along the spine and from the neck to the low back depending upon the location of your pain.   After numbing the skin with local anesthetic (like Novocaine), a small needle is passed into the epidural space slowly.  You may experience a sensation of pressure while this is being done.  The entire block usually last less than 10 minutes.  Conditions which may be treated by epidural steroids:   Low back and leg pain  Neck and arm pain  Spinal stenosis  Post-laminectomy syndrome  Herpes zoster (shingles) pain  Pain from compression fractures  Preparation for the injection:  1. Do not eat any solid food or dairy products within 8 hours of your appointment.  2. You may drink clear liquids up to 3 hours before appointment.  Clear liquids include water, black coffee, juice or soda.  No milk or cream please. 3. You may take your regular medication, including pain medications, with a sip of water before your appointment  Diabetics should hold regular insulin (if taken separately) and take 1/2 normal NPH dos the morning of the procedure.  Carry some sugar containing items with you to your appointment. 4. A driver must accompany you and be prepared to drive you home after your procedure.  5. Bring all your current medications with your. 6. An IV may be inserted and   sedation may be given at the discretion of the physician.   7. A blood pressure cuff, EKG and other monitors will often be applied during the procedure.  Some patients may need to have extra oxygen administered for a short period. 8. You will be asked to provide medical information, including your allergies, prior to the procedure.  We must know immediately if you are taking blood thinners (like Coumadin/Warfarin)  Or if you are allergic to IV iodine contrast (dye). We must  know if you could possible be pregnant.  Possible side-effects:  Bleeding from needle site  Infection (rare, may require surgery)  Nerve injury (rare)  Numbness & tingling (temporary)  Difficulty urinating (rare, temporary)  Spinal headache ( a headache worse with upright posture)  Light -headedness (temporary)  Pain at injection site (several days)  Decreased blood pressure (temporary)  Weakness in arm/leg (temporary)  Pressure sensation in back/neck (temporary)  Call if you experience:  Fever/chills associated with headache or increased back/neck pain.  Headache worsened by an upright position.  New onset weakness or numbness of an extremity below the injection site  Hives or difficulty breathing (go to the emergency room)  Inflammation or drainage at the infection site  Severe back/neck pain  Any new symptoms which are concerning to you  Please note:  Although the local anesthetic injected can often make your back or neck feel good for several hours after the injection, the pain will likely return.  It takes 3-7 days for steroids to work in the epidural space.  You may not notice any pain relief for at least that one week.  If effective, we will often do a series of three injections spaced 3-6 weeks apart to maximally decrease your pain.  After the initial series, we generally will wait several months before considering a repeat injection of the same type.  If you have any questions, please call [phone] [facility] 

## 2020-05-23 NOTE — Progress Notes (Signed)
Safety precautions to be maintained throughout the outpatient stay will include: orient to surroundings, keep bed in low position, maintain call bell within reach at all times, provide assistance with transfer out of bed and ambulation.  

## 2020-05-24 ENCOUNTER — Telehealth: Payer: Self-pay

## 2020-05-24 NOTE — Telephone Encounter (Signed)
No answer. Left message to  All if needed.

## 2020-06-01 ENCOUNTER — Telehealth: Payer: Self-pay | Admitting: Student in an Organized Health Care Education/Training Program

## 2020-06-01 DIAGNOSIS — M48061 Spinal stenosis, lumbar region without neurogenic claudication: Secondary | ICD-10-CM

## 2020-06-01 DIAGNOSIS — M5416 Radiculopathy, lumbar region: Secondary | ICD-10-CM

## 2020-06-01 DIAGNOSIS — G8929 Other chronic pain: Secondary | ICD-10-CM

## 2020-06-01 NOTE — Telephone Encounter (Signed)
Patient states he has had 3 procedures and is out one week since last one with no relief. Pain in his back radiating down his left leg. Please advise patient what his next step is. He has PPE appt April 26

## 2020-06-03 NOTE — Telephone Encounter (Signed)
Can you move his appointment up to discuss alternative options?  I guess you could make it virtual if you needed to.

## 2020-06-03 NOTE — Telephone Encounter (Signed)
Dr Holley Raring, [place].

## 2020-06-06 ENCOUNTER — Telehealth: Payer: Self-pay

## 2020-06-06 ENCOUNTER — Encounter: Payer: Self-pay | Admitting: Student in an Organized Health Care Education/Training Program

## 2020-06-06 NOTE — Telephone Encounter (Signed)
LM for patient to call office for pre virtual appointment questions.  

## 2020-06-07 ENCOUNTER — Ambulatory Visit
Payer: 59 | Attending: Student in an Organized Health Care Education/Training Program | Admitting: Student in an Organized Health Care Education/Training Program

## 2020-06-07 ENCOUNTER — Other Ambulatory Visit: Payer: Self-pay

## 2020-06-07 ENCOUNTER — Encounter: Payer: Self-pay | Admitting: Student in an Organized Health Care Education/Training Program

## 2020-06-07 ENCOUNTER — Telehealth: Payer: Self-pay

## 2020-06-07 DIAGNOSIS — G894 Chronic pain syndrome: Secondary | ICD-10-CM

## 2020-06-07 DIAGNOSIS — M48061 Spinal stenosis, lumbar region without neurogenic claudication: Secondary | ICD-10-CM | POA: Diagnosis not present

## 2020-06-07 DIAGNOSIS — G8929 Other chronic pain: Secondary | ICD-10-CM

## 2020-06-07 DIAGNOSIS — M5416 Radiculopathy, lumbar region: Secondary | ICD-10-CM

## 2020-06-07 NOTE — Progress Notes (Signed)
Patient: Chase Hull  Service Category: E/M  Provider: Gillis Santa, MD  DOB: 20-Jan-1966  DOS: 06/07/2020  Location: Office  MRN: [id number]  Setting: Ambulatory outpatient  Referring Provider: Valerie Roys, DO  Type: Established Patient  Specialty: Interventional Pain Management  PCP: Valerie Roys, DO  Location: Home  Delivery: TeleHealth     Virtual Encounter - Pain Management PROVIDER NOTE: Information contained herein reflects review and annotations entered in association with encounter. Interpretation of such information and data should be left to medically-trained personnel. Information provided to patient can be located elsewhere in the medical record under "Patient Instructions". Document created using STT-dictation technology, any transcriptional errors that may result from process are unintentional.    Contact & Pharmacy Preferred: [phone] Home: [phone] (home) Mobile: [phone] (mobile) E-mail: [email or web address]  [id number] [place], [place] - [address] [address] Phone: [phone] Fax: [phone]  [id number] [address], [place] [address] Phone: [phone] Fax: [phone]  [id number] [address], [place] [address] Phone: [phone] Fax: [phone]   Pre-screening  Mr. Kohan offered "in-person" vs "virtual" encounter. He indicated preferring virtual for this encounter.   Reason COVID-19*  Social distancing based on CDC and AMA recommendations.   I contacted Joanna Hews on 06/07/2020 via video conference.      I clearly identified myself as Gillis Santa, MD. I verified that I was speaking with the correct person using two identifiers (Name: Tryson Lumley, and date of birth: Jul 15, 1965).  Consent I sought verbal advanced consent from Joanna Hews for virtual visit  interactions. I informed Mr. Mcpartlin of possible security and privacy concerns, risks, and limitations associated with providing "not-in-person" medical evaluation and management services. I also informed Mr. Kazlauskas of the availability of "in-person" appointments. Finally, I informed him that there would be a charge for the virtual visit and that he could be  personally, fully or partially, financially responsible for it. Mr. Swor expressed understanding and agreed to proceed.   Historic Elements   Mr. Shahiem Bedwell is a 55 y.o. year old, male patient evaluated today after our last contact on 06/01/2020. Mr. Carducci  has a past medical history of Carpal tunnel syndrome, Diabetes mellitus without complication ([place]), Gout, and Hypertension. He also  has a past surgical history that includes Left hand surgery (03/2018); Colonoscopy with propofol (N/A, 03/30/2019); and polypectomy (N/A, 03/30/2019). Mr. Pattillo has a current medication list which includes the following prescription(s): allopurinol, amlodipine, aspirin, fifty50 glucose meter 2.0, hydrocodone-acetaminophen, lisinopril, and naproxen. He  reports that he has been smoking cigarettes. He has a 12.50 pack-year smoking history. He has never used smokeless tobacco. He reports previous alcohol use. He reports that he does not use drugs. Mr. Koren is allergic to gabapentin.   HPI  Today, he is being contacted for a post-procedure assessment.   Post-Procedure Evaluation  Procedure (06/01/2020):   Type: Diagnostic/Theraputic Inter-Laminar Epidural Steroid Injection  #3  Region: Lumbar Level: L4-5 Level. Laterality: Left-Sided      Sedation: Please see nurses note.  Effectiveness during initial hour after procedure(Ultra-Short Term Relief): 100 %  Local anesthetic used: Long-acting (4-6 hours) Effectiveness: Defined as any analgesic benefit obtained secondary to the administration of local anesthetics. This carries significant diagnostic value as to the  etiological location, or anatomical origin, of the  pain. Duration of benefit is expected to coincide with the duration of the local anesthetic used.  Effectiveness during initial 4-6 hours after procedure(Short-Term Relief): 100 %   Long-term benefit: Defined as any relief past the pharmacologic duration of the local anesthetics.  Effectiveness past the initial 6 hours after procedure(Long-Term Relief): 75 % (lasting 4 days then pain began to return)   Current benefits: Defined as benefit that persist at this time.   Analgesia:  Back to baseline Function: Back to baseline ROM: Back to baseline    Laboratory Chemistry Profile   Renal Lab Results  Component Value Date   BUN 13 04/22/2020   CREATININE 1.03 04/22/2020   BCR 13 04/22/2020   GFRAA 96 04/22/2020   GFRNONAA 83 04/22/2020     Hepatic Lab Results  Component Value Date   AST 21 04/22/2020   ALT 26 04/22/2020   ALBUMIN 4.4 04/22/2020   ALKPHOS 120 04/22/2020     Electrolytes Lab Results  Component Value Date   NA 139 04/22/2020   K 3.4 (L) 04/22/2020   CL 102 04/22/2020   CALCIUM 9.5 04/22/2020     Bone No results found for: VD25OH, VD125OH2TOT, FN3719HV2, PA0325UV5, 25OHVITD1, 25OHVITD2, 25OHVITD3, TESTOFREE, TESTOSTERONE   Inflammation (CRP: Acute Phase) (ESR: Chronic Phase) No results found for: CRP, ESRSEDRATE, LATICACIDVEN     Note: Above Lab results reviewed.   CLINICAL DATA:  Lumbar radiculopathy. Low back pain with left leg pain.  EXAM: MRI LUMBAR SPINE WITHOUT CONTRAST  TECHNIQUE: Multiplanar, multisequence MR imaging of the lumbar spine was performed. No intravenous contrast was administered.  COMPARISON:  Lumbar spine radiographs 12/03/2019  FINDINGS: Segmentation:  Normal  Alignment:  Normal  Vertebrae: Negative for fracture or mass. Discogenic sclerosis in the bone marrow at L5-S1.  Conus medullaris and cauda equina: Conus extends to the L2 level. Conus and cauda equina  appear normal.  Paraspinal and other soft tissues: Negative for paraspinous mass or adenopathy. Small bilateral renal cysts.  Disc levels:  L1-2: Mild disc bulging.  Negative for disc protrusion or stenosis  L2-3: Disc degeneration with the use bulging of the disc and mild endplate spurring. Mild facet hypertrophy. Mild spinal stenosis. Mild subarticular stenosis bilaterally.  L3-4: Diffuse bulging of the disc with associated endplate spurring. Shallow central disc protrusion. Bilateral facet and ligamentum flavum hypertrophy. Moderate spinal stenosis and moderate subarticular stenosis bilaterally  L4-5: Disc degeneration with disc bulging and spurring. Central and right-sided disc protrusion compressing the thecal sac and causing severe spinal stenosis. Facet and ligamentum flavum hypertrophy also contribute to stenosis. Severe subarticular and foraminal stenosis bilaterally.  L5-S1: Advanced disc degeneration with disc space narrowing and endplate sclerosis. Diffuse endplate spurring. Moderate to severe subarticular and foraminal stenosis on the left. Moderate subarticular and foraminal stenosis on the right.  IMPRESSION: Multilevel degenerative change throughout the lumbar spine as above. Multilevel stenosis most severe at L4-5 where there is severe spinal stenosis as well as severe subarticular and foraminal stenosis bilaterally.   Electronically Signed   By: Marlan Palau M.D.   On: 12/24/2019 13:06  Assessment  The primary encounter diagnosis was Spinal stenosis, lumbar region, without neurogenic claudication. Diagnoses of Chronic radicular lumbar pain (left chronic, right new), Lumbar radiculopathy, and Chronic pain syndrome were also pertinent to this visit.  Plan of Care  [facility] follows up today for postprocedural evaluation after left L4-L5 ESI #3 that was performed on 05/23/2020.  Patient states that he received approximately 75% pain relief that lasted  5 days with gradual return of his pain thereafter.  He has done a series of 3 lumbar epidural steroid injections which have only provided short-term pain relief.  His pain is getting worse and it is radiating down his left leg in a posterior lateral distribution.  He has MRI evidence of severe lumbar spinal stenosis at L4-L5 with associated neurogenic claudication as the patient has increased pain with exertion that gets better with rest and forward flexion.  Given limited duration of analgesic response to epidural steroid injection, I will place a referral for Dr. Cari Caraway with neurosurgery for evaluation.   Orders:  Orders Placed This Encounter  Procedures  . Ambulatory referral to Neurosurgery    Referral Priority:   Routine    Referral Type:   Surgical    Referral Reason:   Specialty Services Required    Referred to Provider:   Meade Maw, MD    Requested Specialty:   Neurosurgery    Number of Visits Requested:   1   Follow-up plan:   Return if symptoms worsen or fail to improve.     left L4/5 ESI #1, 6 cc injected 03/15/20, #2 04/04/20: 7cc injected, #3 05/23/20 8 cc injected      Recent Visits Date Type Provider Dept  05/23/20 Procedure visit Gillis Santa, MD [facility]  05/05/20 Telemedicine Gillis Santa, MD [facility]  04/04/20 Procedure visit Gillis Santa, MD [facility]  03/15/20 Telemedicine Gillis Santa, MD [facility]  Showing recent visits within past 90 days and meeting all other requirements Today's Visits Date Type Provider Dept  06/07/20 Telemedicine Gillis Santa, MD [facility]  Showing today's visits and meeting all other requirements Future Appointments No visits were found meeting these conditions. Showing future appointments within next 90 days and meeting all other requirements  I discussed the assessment and treatment plan with the patient. The patient was provided an opportunity to ask questions  and all were answered. The patient agreed with the plan and demonstrated an understanding of the instructions.  Patient advised to call back or seek an in-person evaluation if the symptoms or condition worsens.  Duration of encounter: 30 minutes.  Note by: Gillis Santa, MD Date: 06/07/2020; Time: 12:55 PM

## 2020-06-07 NOTE — Telephone Encounter (Signed)
I am going to place a referral for neurosurgery.

## 2020-06-07 NOTE — Telephone Encounter (Signed)
LM for patient to call office

## 2020-06-09 ENCOUNTER — Telehealth: Payer: Self-pay | Admitting: Family Medicine

## 2020-06-09 NOTE — Telephone Encounter (Signed)
Last 2/25 scheduled for next 8/25 per office note

## 2020-06-09 NOTE — Telephone Encounter (Signed)
Medication Refill - Medication:   HYDROcodone-acetaminophen (NORCO) 10-325 MG tablet   Has the patient contacted their pharmacy? No. controlled substance, no refills.  (Agent: If no, request that the patient contact the pharmacy for the refill.) (Agent: If yes, when and what did the pharmacy advise?)  Preferred Pharmacy (with phone number or street name):   [id number] - [place], [place] [address]  [address] [place] [address]  Phone: [phone] Fax: [phone]      Agent: Please be advised that RX refills may take up to 3 business days. We ask that you follow-up with your pharmacy.

## 2020-06-09 NOTE — Telephone Encounter (Signed)
Requested medication (s) are due for refill today: yes  Requested medication (s) are on the active medication list: yes  Last refill:  12/17/2019  Future visit scheduled: yes  Notes to clinic: this refill cannot be delegated    Requested Prescriptions  Pending Prescriptions Disp Refills   HYDROcodone-acetaminophen (NORCO) 10-325 MG tablet 30 tablet 0    Sig: Take 1 tablet by mouth every 8 (eight) hours as needed.      There is no refill protocol information for this order

## 2020-06-21 ENCOUNTER — Telehealth: Payer: 59 | Admitting: Student in an Organized Health Care Education/Training Program

## 2020-07-14 ENCOUNTER — Other Ambulatory Visit: Payer: Self-pay | Admitting: Neurosurgery

## 2020-07-28 ENCOUNTER — Other Ambulatory Visit
Admission: RE | Admit: 2020-07-28 | Discharge: 2020-07-28 | Disposition: A | Discharge status: Home / Self Care | Payer: 59 | Source: Ambulatory Visit | Attending: Neurosurgery | Admitting: Neurosurgery

## 2020-07-28 DIAGNOSIS — Z01818 Encounter for other preprocedural examination: Secondary | ICD-10-CM | POA: Insufficient documentation

## 2020-07-28 DIAGNOSIS — I4430 Unspecified atrioventricular block: Secondary | ICD-10-CM | POA: Insufficient documentation

## 2020-07-28 LAB — CBC
HCT: 42.2 % (ref 39.0–52.0)
Hemoglobin: 14.5 g/dL (ref 13.0–17.0)
MCH: 31.5 pg (ref 26.0–34.0)
MCHC: 34.4 g/dL (ref 30.0–36.0)
MCV: 91.7 fL (ref 80.0–100.0)
Platelets: 238 10*3/uL (ref 150–400)
RBC: 4.6 MIL/uL (ref 4.22–5.81)
RDW: 13.2 % (ref 11.5–15.5)
WBC: 10.8 10*3/uL — ABNORMAL HIGH (ref 4.0–10.5)
nRBC: 0 % (ref 0.0–0.2)

## 2020-07-28 LAB — URINALYSIS, ROUTINE W REFLEX MICROSCOPIC
Bacteria, UA: NONE SEEN
Bilirubin Urine: NEGATIVE
Glucose, UA: >=500 mg/dL — AB
Hgb urine dipstick: NEGATIVE
Ketones, ur: NEGATIVE mg/dL
Leukocytes,Ua: NEGATIVE
Nitrite: NEGATIVE
Protein, ur: NEGATIVE mg/dL
Specific Gravity, Urine: 1.022 (ref 1.005–1.030)
Squamous Epithelial / HPF: NONE SEEN (ref 0–5)
pH: 6 (ref 5.0–8.0)

## 2020-07-28 LAB — TYPE AND SCREEN
ABO/RH(D): O POS
Antibody Screen: NEGATIVE

## 2020-07-28 LAB — BASIC METABOLIC PANEL
Anion gap: 10 (ref 5–15)
BUN: 15 mg/dL (ref 6–20)
CO2: 24 mmol/L (ref 22–32)
Calcium: 9.4 mg/dL (ref 8.9–10.3)
Chloride: 101 mmol/L (ref 98–111)
Creatinine, Ser: 0.81 mg/dL (ref 0.61–1.24)
GFR, Estimated: >60 mL/min (ref >60)
Glucose, Bld: 268 mg/dL — ABNORMAL HIGH (ref 70–99)
Potassium: 3.5 mmol/L (ref 3.5–5.1)
Sodium: 135 mmol/L (ref 135–145)

## 2020-07-28 LAB — SURGICAL PCR SCREEN
MRSA, PCR: NEGATIVE
Staphylococcus aureus: NEGATIVE

## 2020-07-28 LAB — APTT: aPTT: 30 seconds (ref 24–36)

## 2020-07-28 LAB — PROTIME-INR
INR: 1 (ref 0.8–1.2)
Prothrombin Time: 13.1 seconds (ref 11.4–15.2)

## 2020-07-28 NOTE — Progress Notes (Signed)
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 When compared to EKG in care everywhere from 11/2014, no change is evident.

## 2020-07-28 NOTE — Patient Instructions (Signed)
Your procedure is scheduled on: 08/03/20 - Wednesday Report to the Registration Desk on the 1st floor of the [facility]. To find out your arrival time, please call [phone] between 1PM - 3PM on: 08/03/20 - Tuesday  REMEMBER: Instructions that are not followed completely may result in serious medical risk, up to and including death; or upon the discretion of your surgeon and anesthesiologist your surgery may need to be rescheduled.  Do not eat food after midnight the night before surgery.  No gum chewing, lozengers or hard candies.  You may however, drink CLEAR liquids up to 2 hours before you are scheduled to arrive for your surgery. Do not drink anything within 2 hours of your scheduled arrival time, Type 1 and Type 2 diabetics should only drink water.  TAKE THESE MEDICATIONS THE MORNING OF SURGERY WITH A SIP OF WATER:  - amLODipine (NORVASC) 10 MG tablet   Follow recommendations from Cardiologist, Pulmonologist or PCP regarding stopping Aspirin, Coumadin, Plavix, Eliquis, Pradaxa, or Pletal. Stop Aspirin 81 mg beginning 07/28/20, may restart beginning 08/18/20.  One week prior to surgery: Stop taking beginning 07/28/20 Stop Anti-inflammatories (NSAIDS) such as Advil, Aleve, Ibuprofen, Motrin, Naproxen, Naprosyn and Aspirin based products such as Excedrin, Goodys Powder, BC Powder.  Stop ANY OVER THE COUNTER supplements until after surgery.  You may  take Tylenol as directed if needed for pain up until the day of surgery.  No Alcohol for 24 hours before or after surgery.  No Smoking including e-cigarettes for 24 hours prior to surgery.  No chewable tobacco products for at least 6 hours prior to surgery.  No nicotine patches on the day of surgery.  Do not use any "recreational" drugs for at least a week prior to your surgery.  Please be advised that the combination of cocaine and anesthesia may have negative outcomes, up to and including death. If you test positive for  cocaine, your surgery will be cancelled.  On the morning of surgery brush your teeth with toothpaste and water, you may rinse your mouth with mouthwash if you wish. Do not swallow any toothpaste or mouthwash.  Do not wear jewelry, make-up, hairpins, clips or nail polish.  Do not wear lotions, powders, or perfumes.   Do not shave body from the neck down 48 hours prior to surgery just in case you cut yourself which could leave a site for infection.  Also, freshly shaved skin may become irritated if using the CHG soap.  Contact lenses, hearing aids and dentures may not be worn into surgery.  Do not bring valuables to the hospital. [facility] is not responsible for any missing/lost belongings or valuables.   Use CHG Soap or wipes as directed on instruction sheet.  Notify your doctor if there is any change in your medical condition (cold, fever, infection).  Wear comfortable clothing (specific to your surgery type) to the hospital.  Plan for stool softeners for home use; pain medications have a tendency to cause constipation. You can also help prevent constipation by eating foods high in fiber such as fruits and vegetables and drinking plenty of fluids as your diet allows.  After surgery, you can help prevent lung complications by doing breathing exercises.  Take deep breaths and cough every 1-2 hours. Your doctor may order a device called an Incentive Spirometer to help you take deep breaths. When coughing or sneezing, hold a pillow firmly against your incision with both hands. This is called "splinting." Doing this helps protect your incision.  It also decreases belly discomfort.  If you are being admitted to the hospital overnight, leave your suitcase in the car. After surgery it may be brought to your room.  If you are being discharged the day of surgery, you will not be allowed to drive home. You will need a responsible adult (18 years or older) to drive you home and stay with you  that night.   If you are taking public transportation, you will need to have a responsible adult (18 years or older) with you. Please confirm with your physician that it is acceptable to use public transportation.   Please call the [facility] at [phone] if you have any questions about these instructions.  Surgery Visitation Policy:  Patients undergoing a surgery or procedure may have one family member or support person with them as long as that person is not COVID-19 positive or experiencing its symptoms.  That person may remain in the waiting area during the procedure.  Inpatient Visitation:    Visiting hours are 7 a.m. to 8 p.m. Inpatients will be allowed two visitors daily. The visitors may change each day during the patient's stay. No visitors under the age of 64. Any visitor under the age of 75 must be accompanied by an adult. The visitor must pass COVID-19 screenings, use hand sanitizer when entering and exiting the patient's room and wear a mask at all times, including in the patient's room. Patients must also wear a mask when staff or their visitor are in the room. Masking is required regardless of vaccination status.  Total Hip/Knee Replacement Preoperative Educational Video  To better prepare for surgery, please view our videos that explain the physical activity and discharge planning required to have the best surgical recovery at [facility].  [email or web address]      Questions? Call [phone] or email [email or web address]

## 2020-08-01 ENCOUNTER — Other Ambulatory Visit: Payer: Self-pay

## 2020-08-01 ENCOUNTER — Other Ambulatory Visit
Admission: RE | Admit: 2020-08-01 | Discharge: 2020-08-01 | Disposition: A | Discharge status: Home / Self Care | Payer: 59 | Source: Ambulatory Visit | Attending: Neurosurgery | Admitting: Neurosurgery

## 2020-08-01 DIAGNOSIS — Z20822 Contact with and (suspected) exposure to covid-19: Secondary | ICD-10-CM | POA: Diagnosis not present

## 2020-08-01 DIAGNOSIS — Z01812 Encounter for preprocedural laboratory examination: Secondary | ICD-10-CM | POA: Diagnosis not present

## 2020-08-02 LAB — SARS CORONAVIRUS 2 (TAT 6-24 HRS): SARS Coronavirus 2: NEGATIVE

## 2020-08-02 MED ORDER — ORAL CARE MOUTH RINSE: 15.0000 mL | Freq: Once | OROMUCOSAL | Status: AC

## 2020-08-02 MED ORDER — SODIUM CHLORIDE 0.9 % IV SOLN: INTRAVENOUS | Status: DC

## 2020-08-02 MED ORDER — CHLORHEXIDINE GLUCONATE 0.12 % MT SOLN
15.0000 mL | Freq: Once | OROMUCOSAL | Status: AC
  Administered 2020-08-03: 15 mL via OROMUCOSAL

## 2020-08-02 MED ORDER — FAMOTIDINE 20 MG PO TABS
20.0000 mg | ORAL_TABLET | Freq: Once | ORAL | Status: AC
  Administered 2020-08-03: 20 mg via ORAL

## 2020-08-03 ENCOUNTER — Ambulatory Visit: Payer: 59 | Admitting: Registered Nurse

## 2020-08-03 ENCOUNTER — Encounter: Payer: Self-pay | Admitting: Neurosurgery

## 2020-08-03 ENCOUNTER — Encounter
Admission: RE | Disposition: A | Discharge status: Home / Self Care | Payer: Self-pay | Source: Home / Self Care | Attending: Neurosurgery

## 2020-08-03 ENCOUNTER — Ambulatory Visit: Payer: 59

## 2020-08-03 ENCOUNTER — Observation Stay
Admission: RE | Admit: 2020-08-03 | Discharge: 2020-08-04 | Disposition: A | Discharge status: Home / Self Care | Payer: 59 | Attending: Neurosurgery | Admitting: Neurosurgery

## 2020-08-03 ENCOUNTER — Other Ambulatory Visit: Payer: Self-pay

## 2020-08-03 DIAGNOSIS — M5416 Radiculopathy, lumbar region: Secondary | ICD-10-CM | POA: Insufficient documentation

## 2020-08-03 DIAGNOSIS — Z419 Encounter for procedure for purposes other than remedying health state, unspecified: Secondary | ICD-10-CM

## 2020-08-03 DIAGNOSIS — M48062 Spinal stenosis, lumbar region with neurogenic claudication: Secondary | ICD-10-CM | POA: Diagnosis not present

## 2020-08-03 HISTORY — PX: LUMBAR LAMINECTOMY/DECOMPRESSION MICRODISCECTOMY: SHX5026

## 2020-08-03 LAB — POCT I-STAT, CHEM 8
BUN: 14 mg/dL (ref 6–20)
Calcium, Ion: 1.25 mmol/L (ref 1.15–1.40)
Chloride: 101 mmol/L (ref 98–111)
Creatinine, Ser: 0.7 mg/dL (ref 0.61–1.24)
Glucose, Bld: 274 mg/dL — ABNORMAL HIGH (ref 70–99)
HCT: 41 % (ref 39.0–52.0)
Hemoglobin: 13.9 g/dL (ref 13.0–17.0)
Potassium: 3.6 mmol/L (ref 3.5–5.1)
Sodium: 136 mmol/L (ref 135–145)
TCO2: 25 mmol/L (ref 22–32)

## 2020-08-03 LAB — GLUCOSE, CAPILLARY
Glucose-Capillary: 279 mg/dL — ABNORMAL HIGH (ref 70–99)
Glucose-Capillary: 312 mg/dL — ABNORMAL HIGH (ref 70–99)

## 2020-08-03 LAB — ABO/RH: ABO/RH(D): O POS

## 2020-08-03 SURGERY — LUMBAR LAMINECTOMY/DECOMPRESSION MICRODISCECTOMY 3 LEVELS
Anesthesia: General

## 2020-08-03 MED ORDER — ACETAMINOPHEN 10 MG/ML IV SOLN
INTRAVENOUS | Status: AC
  Filled 2020-08-03: qty 100

## 2020-08-03 MED ORDER — POTASSIUM CHLORIDE IN NACL 20-0.9 MEQ/L-% IV SOLN
INTRAVENOUS | Status: DC
  Filled 2020-08-03 (×4): qty 1000

## 2020-08-03 MED ORDER — SODIUM CHLORIDE 0.9% FLUSH: 3.0000 mL | Freq: Two times a day (BID) | INTRAVENOUS | Status: DC

## 2020-08-03 MED ORDER — LACTATED RINGERS IV SOLN: INTRAVENOUS | Status: DC | PRN

## 2020-08-03 MED ORDER — ONDANSETRON HCL 4 MG/2ML IJ SOLN
INTRAMUSCULAR | Status: AC
  Filled 2020-08-03: qty 2

## 2020-08-03 MED ORDER — ONDANSETRON HCL 4 MG PO TABS: 4.0000 mg | ORAL_TABLET | Freq: Four times a day (QID) | ORAL | Status: DC | PRN

## 2020-08-03 MED ORDER — SENNOSIDES-DOCUSATE SODIUM 8.6-50 MG PO TABS: 1.0000 | ORAL_TABLET | Freq: Every evening | ORAL | Status: DC | PRN

## 2020-08-03 MED ORDER — CHLORHEXIDINE GLUCONATE 0.12 % MT SOLN
OROMUCOSAL | Status: AC
  Filled 2020-08-03: qty 15

## 2020-08-03 MED ORDER — METHYLPREDNISOLONE ACETATE 40 MG/ML IJ SUSP
INTRAMUSCULAR | Status: DC | PRN
  Administered 2020-08-03: 40 mg

## 2020-08-03 MED ORDER — ONDANSETRON HCL 4 MG/2ML IJ SOLN
INTRAMUSCULAR | Status: DC | PRN
  Administered 2020-08-03: 4 mg via INTRAVENOUS

## 2020-08-03 MED ORDER — METHOCARBAMOL 500 MG PO TABS
500.0000 mg | ORAL_TABLET | Freq: Four times a day (QID) | ORAL | Status: DC | PRN
  Administered 2020-08-03: 500 mg via ORAL
  Filled 2020-08-03: qty 1

## 2020-08-03 MED ORDER — FENTANYL CITRATE (PF) 100 MCG/2ML IJ SOLN
INTRAMUSCULAR | Status: AC
  Filled 2020-08-03: qty 2

## 2020-08-03 MED ORDER — OXYCODONE HCL 5 MG PO TABS
10.0000 mg | ORAL_TABLET | ORAL | Status: DC | PRN
  Administered 2020-08-03: 10 mg via ORAL
  Filled 2020-08-03: qty 2

## 2020-08-03 MED ORDER — SODIUM CHLORIDE 0.9% FLUSH: 3.0000 mL | INTRAVENOUS | Status: DC | PRN

## 2020-08-03 MED ORDER — MIDAZOLAM HCL 2 MG/2ML IJ SOLN
INTRAMUSCULAR | Status: DC | PRN
  Administered 2020-08-03: 2 mg via INTRAVENOUS

## 2020-08-03 MED ORDER — ACETAMINOPHEN 10 MG/ML IV SOLN: 1000.0000 mg | Freq: Once | INTRAVENOUS | Status: DC | PRN

## 2020-08-03 MED ORDER — SUCCINYLCHOLINE CHLORIDE 20 MG/ML IJ SOLN
INTRAMUSCULAR | Status: DC | PRN
  Administered 2020-08-03: 120 mg via INTRAVENOUS

## 2020-08-03 MED ORDER — PHENYLEPHRINE HCL (PRESSORS) 10 MG/ML IV SOLN
INTRAVENOUS | Status: DC | PRN
  Administered 2020-08-03: 100 ug via INTRAVENOUS
  Administered 2020-08-03 (×2): 200 ug via INTRAVENOUS
  Administered 2020-08-03: 100 ug via INTRAVENOUS

## 2020-08-03 MED ORDER — SODIUM CHLORIDE 0.9 % IV SOLN
INTRAVENOUS | Status: DC | PRN
  Administered 2020-08-03: 60 mL

## 2020-08-03 MED ORDER — METHOCARBAMOL 1000 MG/10ML IJ SOLN
500.0000 mg | Freq: Four times a day (QID) | INTRAVENOUS | Status: DC | PRN
  Filled 2020-08-03: qty 5

## 2020-08-03 MED ORDER — AMLODIPINE BESYLATE 10 MG PO TABS
10.0000 mg | ORAL_TABLET | Freq: Every day | ORAL | Status: DC
  Administered 2020-08-03 – 2020-08-04 (×2): 10 mg via ORAL
  Filled 2020-08-03 (×2): qty 1

## 2020-08-03 MED ORDER — DEXAMETHASONE SODIUM PHOSPHATE 10 MG/ML IJ SOLN
INTRAMUSCULAR | Status: DC | PRN
  Administered 2020-08-03: 10 mg via INTRAVENOUS

## 2020-08-03 MED ORDER — EPHEDRINE 5 MG/ML INJ
INTRAVENOUS | Status: AC
  Filled 2020-08-03: qty 10

## 2020-08-03 MED ORDER — SUCCINYLCHOLINE CHLORIDE 200 MG/10ML IV SOSY
PREFILLED_SYRINGE | INTRAVENOUS | Status: AC
  Filled 2020-08-03: qty 10

## 2020-08-03 MED ORDER — LIDOCAINE HCL (CARDIAC) PF 100 MG/5ML IV SOSY
PREFILLED_SYRINGE | INTRAVENOUS | Status: DC | PRN
  Administered 2020-08-03: 100 mg via INTRAVENOUS

## 2020-08-03 MED ORDER — LISINOPRIL 20 MG PO TABS
20.0000 mg | ORAL_TABLET | Freq: Every day | ORAL | Status: DC
  Administered 2020-08-03 – 2020-08-04 (×2): 20 mg via ORAL
  Filled 2020-08-03 (×2): qty 1

## 2020-08-03 MED ORDER — BUPIVACAINE-EPINEPHRINE (PF) 0.5% -1:200000 IJ SOLN
INTRAMUSCULAR | Status: DC | PRN
  Administered 2020-08-03: 6 mL

## 2020-08-03 MED ORDER — PROPOFOL 10 MG/ML IV BOLUS
INTRAVENOUS | Status: DC | PRN
  Administered 2020-08-03: 180 mg via INTRAVENOUS

## 2020-08-03 MED ORDER — DEXAMETHASONE SODIUM PHOSPHATE 10 MG/ML IJ SOLN
INTRAMUSCULAR | Status: AC
  Filled 2020-08-03: qty 1

## 2020-08-03 MED ORDER — PHENOL 1.4 % MT LIQD
1.0000 | OROMUCOSAL | Status: DC | PRN
  Filled 2020-08-03: qty 177

## 2020-08-03 MED ORDER — KETOROLAC TROMETHAMINE 30 MG/ML IJ SOLN
INTRAMUSCULAR | Status: AC
  Filled 2020-08-03: qty 1

## 2020-08-03 MED ORDER — MIDAZOLAM HCL 2 MG/2ML IJ SOLN
INTRAMUSCULAR | Status: AC
  Filled 2020-08-03: qty 2

## 2020-08-03 MED ORDER — OXYMETAZOLINE HCL 0.05 % NA SOLN
1.0000 | Freq: Two times a day (BID) | NASAL | Status: DC | PRN
  Filled 2020-08-03: qty 15

## 2020-08-03 MED ORDER — LIDOCAINE HCL (PF) 2 % IJ SOLN
INTRAMUSCULAR | Status: AC
  Filled 2020-08-03: qty 5

## 2020-08-03 MED ORDER — CEFAZOLIN SODIUM-DEXTROSE 2-4 GM/100ML-% IV SOLN
INTRAVENOUS | Status: AC
  Filled 2020-08-03: qty 100

## 2020-08-03 MED ORDER — CEFAZOLIN SODIUM-DEXTROSE 2-4 GM/100ML-% IV SOLN
2.0000 g | INTRAVENOUS | Status: AC
  Administered 2020-08-03: 2 g via INTRAVENOUS

## 2020-08-03 MED ORDER — CELECOXIB 200 MG PO CAPS
200.0000 mg | ORAL_CAPSULE | Freq: Two times a day (BID) | ORAL | Status: DC
  Administered 2020-08-03 – 2020-08-04 (×2): 200 mg via ORAL
  Filled 2020-08-03 (×2): qty 1

## 2020-08-03 MED ORDER — ACETAMINOPHEN 500 MG PO TABS
1000.0000 mg | ORAL_TABLET | Freq: Four times a day (QID) | ORAL | Status: DC
  Administered 2020-08-03 – 2020-08-04 (×3): 1000 mg via ORAL
  Filled 2020-08-03 (×3): qty 2

## 2020-08-03 MED ORDER — FENTANYL CITRATE (PF) 100 MCG/2ML IJ SOLN
INTRAMUSCULAR | Status: DC | PRN
  Administered 2020-08-03 (×2): 50 ug via INTRAVENOUS

## 2020-08-03 MED ORDER — THROMBIN 5000 UNITS EX SOLR
CUTANEOUS | Status: DC | PRN
  Administered 2020-08-03: 5000 [IU] via TOPICAL

## 2020-08-03 MED ORDER — FAMOTIDINE 20 MG PO TABS
ORAL_TABLET | ORAL | Status: AC
  Filled 2020-08-03: qty 1

## 2020-08-03 MED ORDER — ACETAMINOPHEN 10 MG/ML IV SOLN
INTRAVENOUS | Status: DC | PRN
  Administered 2020-08-03: 1000 mg via INTRAVENOUS

## 2020-08-03 MED ORDER — ALLOPURINOL 100 MG PO TABS
200.0000 mg | ORAL_TABLET | Freq: Every day | ORAL | Status: DC
  Administered 2020-08-03 – 2020-08-04 (×2): 200 mg via ORAL
  Filled 2020-08-03 (×2): qty 2

## 2020-08-03 MED ORDER — SODIUM CHLORIDE 0.9 % IV SOLN
INTRAVENOUS | Status: DC | PRN
  Administered 2020-08-03: 50 ug/min via INTRAVENOUS

## 2020-08-03 MED ORDER — ONDANSETRON HCL 4 MG/2ML IJ SOLN: 4.0000 mg | Freq: Once | INTRAMUSCULAR | Status: DC | PRN

## 2020-08-03 MED ORDER — FENTANYL CITRATE (PF) 100 MCG/2ML IJ SOLN: 25.0000 ug | INTRAMUSCULAR | Status: DC | PRN

## 2020-08-03 MED ORDER — OXYCODONE HCL 5 MG PO TABS
5.0000 mg | ORAL_TABLET | ORAL | Status: DC | PRN
Start: 2020-08-03 — End: 2020-08-04

## 2020-08-03 MED ORDER — SODIUM CHLORIDE 0.9 % IV SOLN: 250.0000 mL | INTRAVENOUS | Status: DC

## 2020-08-03 MED ORDER — KETOROLAC TROMETHAMINE 30 MG/ML IJ SOLN
INTRAMUSCULAR | Status: DC | PRN
  Administered 2020-08-03: 30 mg via INTRAVENOUS

## 2020-08-03 MED ORDER — OXYCODONE HCL 5 MG PO TABS: 5.0000 mg | ORAL_TABLET | Freq: Once | ORAL | Status: DC | PRN

## 2020-08-03 MED ORDER — ONDANSETRON HCL 4 MG/2ML IJ SOLN: 4.0000 mg | Freq: Four times a day (QID) | INTRAMUSCULAR | Status: DC | PRN

## 2020-08-03 MED ORDER — OXYCODONE HCL 5 MG/5ML PO SOLN
5.0000 mg | Freq: Once | ORAL | Status: DC | PRN
Start: 2020-08-03 — End: 2020-08-03

## 2020-08-03 MED ORDER — KETAMINE HCL 50 MG/ML IJ SOLN
INTRAMUSCULAR | Status: DC | PRN
  Administered 2020-08-03: 50 mg via INTRAMUSCULAR

## 2020-08-03 MED ORDER — EPHEDRINE SULFATE 50 MG/ML IJ SOLN
INTRAMUSCULAR | Status: DC | PRN
  Administered 2020-08-03: 5 mg via INTRAVENOUS

## 2020-08-03 MED ORDER — PHENYLEPHRINE HCL (PRESSORS) 10 MG/ML IV SOLN
INTRAVENOUS | Status: AC
  Filled 2020-08-03: qty 1

## 2020-08-03 MED ORDER — MENTHOL 3 MG MT LOZG
1.0000 | LOZENGE | OROMUCOSAL | Status: DC | PRN
  Filled 2020-08-03: qty 9

## 2020-08-03 SURGICAL SUPPLY — 46 items
BUR NEURO DRILL SOFT 3.0X3.8M (BURR) ×2 IMPLANT
CHLORAPREP W/TINT 26 (MISCELLANEOUS) ×4 IMPLANT
CNTNR SPEC 2.5X3XGRAD LEK (MISCELLANEOUS) ×1
CONT SPEC 4OZ STER OR WHT (MISCELLANEOUS) ×1
CONTAINER SPEC 2.5X3XGRAD LEK (MISCELLANEOUS) ×1 IMPLANT
COUNTER NEEDLE 20/40 LG (NEEDLE) ×2 IMPLANT
COVER WAND RF STERILE (DRAPES) ×2 IMPLANT
CUP MEDICINE 2OZ PLAST GRAD ST (MISCELLANEOUS) ×4 IMPLANT
DERMABOND ADVANCED (GAUZE/BANDAGES/DRESSINGS) ×1
DERMABOND ADVANCED .7 DNX12 (GAUZE/BANDAGES/DRESSINGS) ×1 IMPLANT
DRAPE C ARM PK CFD 31 SPINE (DRAPES) ×2 IMPLANT
DRAPE LAPAROTOMY 100X77 ABD (DRAPES) ×2 IMPLANT
DRAPE MICROSCOPE SPINE 48X150 (DRAPES) ×2 IMPLANT
DRAPE SURG 17X11 SM STRL (DRAPES) ×8 IMPLANT
ELECT CAUTERY BLADE TIP 2.5 (TIP) ×2
ELECT EZSTD 165MM 6.5IN (MISCELLANEOUS) ×2
ELECTRODE CAUTERY BLDE TIP 2.5 (TIP) ×1 IMPLANT
ELECTRODE EZSTD 165MM 6.5IN (MISCELLANEOUS) ×1 IMPLANT
GLOVE SURG SYN 8.5  E (GLOVE) ×3
GLOVE SURG SYN 8.5 E (GLOVE) ×3 IMPLANT
GOWN SRG XL LVL 3 NONREINFORCE (GOWNS) ×1 IMPLANT
GOWN STRL NON-REIN TWL XL LVL3 (GOWNS) ×1
GOWN STRL REUS W/ TWL XL LVL3 (GOWN DISPOSABLE) ×1 IMPLANT
GOWN STRL REUS W/TWL XL LVL3 (GOWN DISPOSABLE) ×1
GRADUATE 1200CC STRL 31836 (MISCELLANEOUS) ×2 IMPLANT
KIT SPINAL PRONEVIEW (KITS) ×2 IMPLANT
KNIFE BAYONET SHORT DISCETOMY (MISCELLANEOUS) IMPLANT
MANIFOLD NEPTUNE II (INSTRUMENTS) ×2 IMPLANT
MARKER SKIN DUAL TIP RULER LAB (MISCELLANEOUS) ×2 IMPLANT
NDL SAFETY ECLIPSE 18X1.5 (NEEDLE) IMPLANT
NEEDLE HYPO 18GX1.5 SHARP (NEEDLE)
NEEDLE HYPO 22GX1.5 SAFETY (NEEDLE) ×2 IMPLANT
NS IRRIG 1000ML POUR BTL (IV SOLUTION) ×2 IMPLANT
PACK LAMINECTOMY NEURO (CUSTOM PROCEDURE TRAY) ×2 IMPLANT
PAD ARMBOARD 7.5X6 YLW CONV (MISCELLANEOUS) ×2 IMPLANT
SPOGE SURGIFLO 8M (HEMOSTASIS) ×1
SPONGE SURGIFLO 8M (HEMOSTASIS) ×1 IMPLANT
SUT DVC VLOC 3-0 CL 6 P-12 (SUTURE) ×2 IMPLANT
SUT VIC AB 0 CT1 27 (SUTURE) ×1
SUT VIC AB 0 CT1 27XCR 8 STRN (SUTURE) ×1 IMPLANT
SUT VIC AB 2-0 CT1 18 (SUTURE) ×2 IMPLANT
SYR 20ML LL LF (SYRINGE) ×2 IMPLANT
SYR 30ML LL (SYRINGE) ×4 IMPLANT
SYR 3ML LL SCALE MARK (SYRINGE) ×2 IMPLANT
TOWEL OR 17X26 4PK STRL BLUE (TOWEL DISPOSABLE) ×6 IMPLANT
TUBING CONNECTING 10 (TUBING) ×2 IMPLANT

## 2020-08-03 NOTE — Progress Notes (Signed)
Pt ambulated to bathroom to void.

## 2020-08-03 NOTE — Anesthesia Preprocedure Evaluation (Signed)
Anesthesia Evaluation  Patient identified by MRN, date of birth, ID band Patient awake    Reviewed: Allergy & Precautions, NPO status , Patient's Chart, lab work & pertinent test results  History of Anesthesia Complications Negative for: history of anesthetic complications  Airway Mallampati: II  TM Distance: >3 FB Neck ROM: Full    Dental no notable dental hx. (+) Teeth Intact   Pulmonary neg sleep apnea, neg COPD, Current Smoker and Patient abstained from smoking.,    Pulmonary exam normal breath sounds clear to auscultation       Cardiovascular Exercise Tolerance: Good METShypertension, (-) CAD and (-) Past MI (-) dysrhythmias  Rhythm:Regular Rate:Normal - Systolic murmurs    Neuro/Psych  Neuromuscular disease negative psych ROS   GI/Hepatic neg GERD  ,(+)     (-) substance abuse  ,   Endo/Other  diabetes, Well Controlled, Type 2  Renal/GU negative Renal ROS     Musculoskeletal   Abdominal   Peds  Hematology   Anesthesia Other Findings Past Medical History: No date: Carpal tunnel syndrome No date: Diabetes mellitus without complication ([facility])     Comment:  no meds, A1C down No date: Gout No date: Hypertension  Reproductive/Obstetrics                             Anesthesia Physical Anesthesia Plan  ASA: II  Anesthesia Plan: General   Post-op Pain Management:    Induction: Intravenous  PONV Risk Score and Plan: 1 and Ondansetron, Dexamethasone and Midazolam  Airway Management Planned: Oral ETT  Additional Equipment: None  Intra-op Plan:   Post-operative Plan: Extubation in OR  Informed Consent: I have reviewed the patients History and Physical, chart, labs and discussed the procedure including the risks, benefits and alternatives for the proposed anesthesia with the patient or authorized representative who has indicated his/her understanding and acceptance.      Dental advisory given  Plan Discussed with: CRNA and Surgeon  Anesthesia Plan Comments: (Discussed risks of anesthesia with patient, including PONV, sore throat, lip/dental damage. Rare risks discussed as well, such as cardiorespiratory and neurological sequelae. Patient understands.)        Anesthesia Quick Evaluation

## 2020-08-03 NOTE — Progress Notes (Signed)
Pharmacy Antibiotic Note  LENARD KAMPF is a 55 y.o. male admitted on (Not on file) with surgical prophylaxis.  Pharmacy has been consulted for Cefazolin dosing.  TBW = 108.6 kg   Plan: Cefazolin 2 gm IV X 1 ordered for 6/8 @ 0500.   Weight: 108.6 kg (239 lb 6.7 oz)  No data recorded.  Recent Labs  Lab 07/28/20 0958  WBC 10.8*  CREATININE 0.81    Estimated Creatinine Clearance: 135.8 mL/min (by C-G formula based on SCr of 0.81 mg/dL).    Allergies  Allergen Reactions  . Gabapentin Swelling    Antimicrobials this admission:   >>    >>   Dose adjustments this admission:   Microbiology results:  BCx:   UCx:    Sputum:    MRSA PCR:   Thank you for allowing pharmacy to be a part of this patient's care.  Damany Eastman D 08/03/2020 5:00 AM

## 2020-08-03 NOTE — Op Note (Signed)
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 He failed conservative management and opted for surgical intervention.  Findings: stenosis  Preoperative Diagnosis: Lumbar Stenosis with neurogenic claudication, lumbar radiculopathy Postoperative Diagnosis: same   EBL: 50 ml IVF: 800 ml Drains: none Disposition: Extubated and Stable to PACU Complications: none  No foley catheter was placed.   Preoperative Note:   Risks of surgery discussed include: infection, bleeding, stroke, coma, death, paralysis, CSF leak, nerve/spinal cord injury, numbness, tingling, weakness, complex regional pain syndrome, recurrent stenosis and/or disc herniation, vascular injury, development of instability, neck/back pain, need for further surgery, persistent symptoms, development of deformity, and the risks of anesthesia. The patient understood these risks and agreed to proceed.  Operative Note:   1. L3-4 and L5-S1 lumbar decompression including central laminectomy and bilateral medial facetectomies including foraminotomies 2. L4-5 discectomy and decompression  The patient was then brought from the preoperative center with intravenous access established.  The patient underwent general anesthesia and endotracheal tube intubation, and was then rotated on the [place] rail top where all pressure points were appropriately padded.  The skin was then thoroughly cleansed.  Perioperative antibiotic prophylaxis was administered.  Sterile prep and drapes were then applied and a timeout was then observed.  C-arm was brought into the field under sterile conditions and under lateral visualization the L5-S1 interspace was identified and marked.  The incision was marked and injected with local anesthetic. Once this was complete a 6 cm incision was opened with the use of a #10 blade knife.    The metrx tubes were sequentially advanced and  confirmed in position at L5-S1. An 49mm by 80mm tube was locked in place to the bed side attachment.  The microscope was then sterilely brought into the field and muscle creep was hemostased with a bipolar and resected with a pituitary rongeur.  A Bovie extender was then used to expose the spinous process and lamina.  Careful attention was placed to not violate the facet capsule. A 3 mm matchstick drill bit was then used to make a hemi-laminotomy trough until the ligamentum flavum was exposed.  This was extended to the base of the spinous process and to the contralateral side to remove all the central bone from each side.  Once this was complete and the underlying ligamentum flavum was visualized, it was dissected with a curette and resected with Kerrison rongeurs.  Extensive ligamentum hypertrophy was noted, requiring a substantial amount of time and care for removal.  The dura was identified and palpated. The kerrison rongeur was then used to remove the medial facet bilaterally until no compression was noted.  A balltip probe was used to confirm decompression of the ipsilateral S1 nerve root.  Additional attention was paid to completion of the contralateral L5-S1 foraminotomy until the contralateral traversing nerve root was completely free.  Once this was complete, L5-S1 central decompression including medial facetectomy and foraminotomy was confirmed and decompression on both sides was confirmed. No CSF leak was noted.  A Depo-Medrol soaked Gelfoam pledget was placed in the defect.  The wound was copiously irrigated. The tube system was then removed under microscopic visualization and hemostasis was obtained with a bipolar.    After performing the decompression at L5-S1, the metrx tubes were sequentially advanced and confirmed in position at L4-5. An 66mm by 40mm tube was locked in place to the bed side attachment.  Fluoroscopy was then removed from  the field.  The microscope was then sterilely brought  into the field and muscle creep was hemostased with a bipolar and resected with a pituitary rongeur.  A Bovie extender was then used to expose the spinous process and lamina.  Careful attention was placed to not violate the facet capsule. A 3 mm matchstick drill bit was then used to make a hemi-laminotomy trough until the ligamentum flavum was exposed.  This was extended to the base of the spinous process and to the contralateral side to remove all the central bone from each side.  Once this was complete and the underlying ligamentum flavum was visualized, it was dissected with a curette and resected with Kerrison rongeurs.  Extensive ligamentum hypertrophy was noted, requiring a substantial amount of time and care for removal.  The dura was identified and palpated. The kerrison rongeur was then used to remove the medial facet bilaterally until no compression was noted.  A balltip probe was used to confirm decompression of the ipsilateral L5 nerve root. The disc herniation was identified, entered,and dissected free prior to removing the herniated material. Thus, L4-5 discectomy was performed.  Additional attention was paid to completion of the contralateral foraminotomy until the contralateral L5 nerve root was completely free.  Once this was complete, L4-5 discectomy and  central decompression including medial facetectomy and foraminotomy was confirmed and decompression on both sides was confirmed. No CSF leak was noted.  A Depo-Medrol soaked Gelfoam pledget was placed in the defect.  The wound was copiously irrigated. The tube system was then removed under microscopic visualization and hemostasis was obtained with a bipolar.    After performing the decompression at L4-5, the metrx tubes were sequentially advanced and confirmed in position at L3-4. An 73mm by 26mm tube was locked in place to the bed side attachment.  Fluoroscopy was then removed from the field.  The microscope was then sterilely brought into  the field and muscle creep was hemostased with a bipolar and resected with a pituitary rongeur.  A Bovie extender was then used to expose the spinous process and lamina.  Careful attention was placed to not violate the facet capsule. A 3 mm matchstick drill bit was then used to make a hemi-laminotomy trough until the ligamentum flavum was exposed.  This was extended to the base of the spinous process and to the contralateral side to remove all the central bone from each side.  Once this was complete and the underlying ligamentum flavum was visualized, it was dissected with a curette and resected with Kerrison rongeurs.  Extensive ligamentum hypertrophy was noted, requiring a substantial amount of time and care for removal.  The dura was identified and palpated. The kerrison rongeur was then used to remove the medial facet bilaterally until no compression was noted.  A balltip probe was used to confirm decompression of the ipsilateral L4 nerve root.  Additional attention was paid to completion of the contralateral foraminotomy until the contralateral L4 nerve root was completely free.  Once this was complete, L3-4 central decompression including medial facetectomy and foraminotomy was confirmed and decompression on both sides was confirmed. No CSF leak was noted.  A Depo-Medrol soaked Gelfoam pledget was placed in the defect.  The wound was copiously irrigated. The tube system was then removed under microscopic visualization and hemostasis was obtained with a bipolar.     The fascial layer was reapproximated with the use of a 0 Vicryl suture.  Subcutaneous tissue layer was reapproximated using 2-0 Vicryl suture.  3-0 monocryl was placed in subcuticular fashion. The skin was then cleansed and Dermabond was used to close the skin opening.  Patient was then rotated back to the preoperative bed awakened from anesthesia and taken to recovery all counts are correct in this case.  I performed the entire procedure  with the assistance of Liliane Bade PA as an Pensions consultant.  Josanna Hefel K. Izora Ribas MD

## 2020-08-03 NOTE — Transfer of Care (Signed)
Immediate Anesthesia Transfer of Care Note  Patient: Chase Hull  Procedure(s) Performed: L3-4 AND L5-S1 DECOMPRESSION, RIGHT L4-5 MICRODISCECTOMY (N/A )  Patient Location: PACU  Anesthesia Type:General  Level of Consciousness: drowsy  Airway & Oxygen Therapy: Patient Spontanous Breathing and Patient connected to face mask oxygen  Post-op Assessment: Report given to RN and Post -op Vital signs reviewed and stable  Post vital signs: Reviewed and stable  Last Vitals:  Vitals Value Taken Time  BP 114/77 08/03/20 1600  Temp    Pulse 93 08/03/20 1605  Resp 23 08/03/20 1605  SpO2 98 % 08/03/20 1605  Vitals shown include unvalidated device data.  Last Pain:  Vitals:   08/03/20 1100  TempSrc: Oral         Complications: No complications documented.

## 2020-08-03 NOTE — H&P (Signed)
I have reviewed and confirmed my history and physical from 07/14/20 with no additions or changes. Plan for L3-4 and L5-S1 decompression with R L4-5 microdiscectomy.  Risks and benefits reviewed.  Heart sounds normal no MRG. Chest Clear to Auscultation Bilaterally.

## 2020-08-03 NOTE — Anesthesia Procedure Notes (Signed)
Procedure Name: Intubation Date/Time: 08/03/2020 1:32 PM Performed by: Debe Coder, CRNA Pre-anesthesia Checklist: Patient identified, Emergency Drugs available, Suction available and Patient being monitored Patient Re-evaluated:Patient Re-evaluated prior to induction Oxygen Delivery Method: Circle system utilized Preoxygenation: Pre-oxygenation with 100% oxygen Induction Type: IV induction Ventilation: Mask ventilation without difficulty Laryngoscope Size: Mac and 4 Grade View: Grade II Tube type: Oral Tube size: 7.5 mm Number of attempts: 1 Airway Equipment and Method: Stylet and Oral airway Placement Confirmation: ETT inserted through vocal cords under direct vision,  positive ETCO2 and breath sounds checked- equal and bilateral Secured at: 23 cm Tube secured with: Tape Dental Injury: Teeth and Oropharynx as per pre-operative assessment

## 2020-08-04 ENCOUNTER — Encounter: Payer: Self-pay | Admitting: Neurosurgery

## 2020-08-04 DIAGNOSIS — M48062 Spinal stenosis, lumbar region with neurogenic claudication: Secondary | ICD-10-CM | POA: Diagnosis not present

## 2020-08-04 MED ORDER — METHOCARBAMOL 500 MG PO TABS: 500.0000 mg | ORAL_TABLET | Freq: Four times a day (QID) | ORAL | 0 refills | Status: DC | PRN

## 2020-08-04 MED ORDER — ALLOPURINOL 100 MG PO TABS
200.0000 mg | ORAL_TABLET | Freq: Every day | ORAL | Status: DC
Start: 2020-08-04 — End: 2020-10-24

## 2020-08-04 MED ORDER — OXYCODONE HCL 5 MG PO TABS: 5.0000 mg | ORAL_TABLET | ORAL | 0 refills | Status: DC | PRN

## 2020-08-04 NOTE — Discharge Summary (Signed)
Physician Discharge Summary  Patient ID: Chase Hull MRN: [id number] DOB/AGE: [birth year] 55 y.o.  Admit date: 08/03/2020 Discharge date: 08/04/2020  Admission Diagnoses: neurogenic claudication  Discharge Diagnoses:  Active Problems:   Neurogenic claudication due to lumbar spinal stenosis   Discharged Condition: good  Hospital Course: Chase Hull was admitted for lumbar decompression and discectomy.  He did very well and was observed overnight.  He was discharged no POD1.  Consults: None  Significant Diagnostic Studies: radiology: X-Ray: localization for surgery  Treatments: surgery: L3-S1 decompression, L4-5 discectomy  Discharge Exam: Blood pressure (!) 135/98, pulse 93, temperature 97.8 F (36.6 C), temperature source Oral, resp. rate 16, height _0  (1.854 m), weight 107.5 kg, SpO2 97 %. General appearance: alert and cooperative CNI 5/5 throughout   Disposition: Discharge disposition: 01-Home or Self Care       Discharge Instructions     Incentive spirometry RT   Complete by: As directed       Allergies as of 08/04/2020       Reactions   Gabapentin Swelling        Medication List     TAKE these medications    allopurinol 100 MG tablet Commonly known as: ZYLOPRIM Take 2 tablets (200 mg total) by mouth daily.   amLODipine 10 MG tablet Commonly known as: NORVASC Take 1 tablet (10 mg total) by mouth daily.   aspirin 81 MG tablet Take 81 mg by mouth daily. Notes to patient: Ok to restart in 5 days   BC HEADACHE PO Take 1 packet by mouth daily as needed (headaches).   Fifty50 Glucose Meter 2.0 w/Device Kit Use to check fasting blood sugars daily   lisinopril 20 MG tablet Commonly known as: ZESTRIL Take 1 tablet (20 mg total) by mouth daily.   methocarbamol 500 MG tablet Commonly known as: ROBAXIN Take 1 tablet (500 mg total) by mouth every 6 (six) hours as needed for muscle spasms.   naproxen 500 MG tablet Commonly known as:  Naprosyn Take 1 tablet (500 mg total) by mouth 2 (two) times daily with a meal.   oxyCODONE 5 MG immediate release tablet Commonly known as: Oxy IR/ROXICODONE Take 1 tablet (5 mg total) by mouth every 3 (three) hours as needed for moderate pain ((score 4 to 6)).   oxymetazoline 0.05 % nasal spray Commonly known as: AFRIN Place 1 spray into both nostrils 2 (two) times daily as needed for congestion.        Follow-up Information     Meade Maw, MD Follow up in 2 week(s).   Specialty: Neurosurgery Contact information: [place] [address] [phone]                 Signed: Meade Maw 08/04/2020, 8:28 AM

## 2020-08-04 NOTE — Anesthesia Postprocedure Evaluation (Signed)
Anesthesia Post Note  Patient: Chase Hull  Procedure(s) Performed: L3-4 AND L5-S1 DECOMPRESSION, RIGHT L4-5 MICRODISCECTOMY  Patient location during evaluation: PACU Anesthesia Type: General Level of consciousness: awake and alert Pain management: pain level controlled Vital Signs Assessment: post-procedure vital signs reviewed and stable Respiratory status: spontaneous breathing, nonlabored ventilation, respiratory function stable and patient connected to nasal cannula oxygen Cardiovascular status: blood pressure returned to baseline and stable Postop Assessment: no apparent nausea or vomiting Anesthetic complications: no   No notable events documented.   Last Vitals:  Vitals:   08/04/20 0624 08/04/20 0802  BP: (!) 128/92 (!) 135/98  Pulse: 89 93  Resp: 16 16  Temp: 36.6 C 36.6 C  SpO2: 99% 97%    Last Pain:  Vitals:   08/04/20 0802  TempSrc: Oral  PainSc:                  [name]

## 2020-08-04 NOTE — Progress Notes (Signed)
Inpatient Diabetes Program Recommendations  AACE/ADA: New Consensus Statement on Inpatient Glycemic Control (2015)  Target Ranges:  Prepandial:   less than 140 mg/dL      Peak postprandial:   less than 180 mg/dL (1-2 hours)      Critically ill patients:  140 - 180 mg/dL   Lab Results  Component Value Date   GLUCAP 312 (H) 08/03/2020   HGBA1C 6.8 04/22/2020    Review of Glycemic Control Results for DOIS, JUARBE (MRN [id number]) as of 08/04/2020 08:26  Ref. Range 08/03/2020 10:33 08/03/2020 18:18  Glucose-Capillary Latest Ref Range: 70 - 99 mg/dL 279 (H) 312 (H)   Diabetes history: DM2 Outpatient Diabetes medications: None Current orders for Inpatient glycemic control: None  Inpatient Diabetes Program Recommendations:    Please consider glycemic control order set Novolog 0-9 units TID and 0-5 units QHS if remains inpatient.   Will continue to follow while inpatient.  Thank you, Reche Dixon, RN, BSN Diabetes Coordinator Inpatient Diabetes Program [phone] (team pager from 8a-5p)

## 2020-08-04 NOTE — Progress Notes (Signed)
Discharge instructions reviewed with patient. Patient verbalizes understanding of instructions. Prescription given to pt.

## 2020-08-04 NOTE — Plan of Care (Signed)
  Problem: Education: Goal: Knowledge of General Education information will improve Description Including pain rating scale, medication(s)/side effects and non-pharmacologic comfort measures Outcome: Progressing   Problem: Health Behavior/Discharge Planning: Goal: Ability to manage health-related needs will improve Outcome: Progressing   

## 2020-08-04 NOTE — Discharge Instructions (Signed)
Your surgeon has performed an operation on your lumbar spine (low back) to relieve pressure on one or more nerves. Many times, patients feel better immediately after surgery and can "overdo it." Even if you feel well, it is important that you follow these activity guidelines. If you do not let your back heal properly from the surgery, you can increase the chance of a disc herniation and/or return of your symptoms. The following are instructions to help in your recovery once you have been discharged from the hospital.  * OK to take anti-inflammatory medications after surgery (naproxen [Aleve], ibuprofen [Advil, Motrin], celecoxib [Celebrex], etc.)  Activity    No bending, lifting, or twisting ("BLT"). Avoid lifting objects heavier than 10 pounds (gallon milk jug).  Where possible, avoid household activities that involve lifting, bending, pushing, or pulling such as laundry, vacuuming, grocery shopping, and childcare. Try to arrange for help from friends and family for these activities while your back heals.  Increase physical activity slowly as tolerated.  Taking short walks is encouraged, but avoid strenuous exercise. Do not jog, run, bicycle, lift weights, or participate in any other exercises unless specifically allowed by your doctor. Avoid prolonged sitting, including car rides.  Talk to your doctor before resuming sexual activity.  You should not drive until cleared by your doctor.  Until released by your doctor, you should not return to work or school.  You should rest at home and let your body heal.   You may shower two days after your surgery.  After showering, lightly dab your incision dry. Do not take a tub bath or go swimming for 3 weeks, or until approved by your doctor at your follow-up appointment.  If you smoke, we strongly recommend that you quit.  Smoking has been proven to interfere with normal healing in your back and will dramatically reduce the success rate of your surgery.  Please contact [facility] (800-QUIT-NOW) and use the resources at www.[facility].com for assistance in stopping smoking.  Surgical Incision   If you have a dressing on your incision, you may remove it three days after your surgery. Keep your incision area clean and dry.  If you have staples or stitches on your incision, you should have a follow up scheduled for removal. If you do not have staples or stitches, you will have steri-strips (small pieces of surgical tape) or Dermabond glue. The steri-strips/glue should begin to peel away within about a week (it is fine if the steri-strips fall off before then). If the strips are still in place one week after your surgery, you may gently remove them.  Diet            You may return to your usual diet. Be sure to stay hydrated.  When to Contact Chase Hull  Although your surgery and recovery will likely be uneventful, you may have some residual numbness, aches, and pains in your back and/or legs. This is normal and should improve in the next few weeks.  However, should you experience any of the following, contact Chase Hull immediately: New numbness or weakness Pain that is progressively getting worse, and is not relieved by your pain medications or rest Bleeding, redness, swelling, pain, or drainage from surgical incision Chills or flu-like symptoms Fever greater than 101.0 F (38.3 C) Problems with bowel or bladder functions Difficulty breathing or shortness of breath Warmth, tenderness, or swelling in your calf  Contact Information During office hours (Monday-Friday 9 am to 5 pm), please call your physician at [phone] After  hours and weekends, please call [phone] and speak with the answering service, who will contact the doctor on call.  If that fails, call the [facility] Operator at [phone] and ask for the Neurosurgery Resident On Call  For a life-threatening emergency, call 911

## 2020-08-04 NOTE — Progress Notes (Signed)
    Attending Progress Note  History: Chase Hull is here for neurogenic claudication.  POD1: Doing well. Minor numbness lateral L thigh  Physical Exam: Vitals:   08/04/20 0624 08/04/20 0802  BP: (!) 128/92 (!) 135/98  Pulse: 89 93  Resp: 16 16  Temp: 97.8 F (36.6 C) 97.8 F (36.6 C)  SpO2: 99% 97%    AA Ox3 CNI  Strength:5/5 throughout BLE  Dressing c/d/i  Data:  Recent Labs  Lab 07/28/20 0958 08/03/20 1058  NA 135 136  K 3.5 3.6  CL 101 101  CO2 24  --   BUN 15 14  CREATININE 0.81 0.70  GLUCOSE 268* 274*  CALCIUM 9.4  --    No results for input(s): AST, ALT, ALKPHOS in the last 168 hours.  Invalid input(s): TBILI   Recent Labs  Lab 07/28/20 0958 08/03/20 1058  WBC 10.8*  --   HGB 14.5 13.9  HCT 42.2 41.0  PLT 238  --    Recent Labs  Lab 07/28/20 0958  APTT 30  INR 1.0         Other tests/results: none  Assessment/Plan:  Chase Hull is doing well  - mobilize - pain control - DVT prophylaxis   Meade Maw MD, [facility] Department of Neurosurgery

## 2020-08-05 LAB — GLUCOSE, CAPILLARY: Glucose-Capillary: 282 mg/dL — ABNORMAL HIGH (ref 70–99)

## 2020-08-08 ENCOUNTER — Encounter: Payer: Self-pay | Admitting: Neurosurgery

## 2020-08-08 MED ORDER — HEMOSTATIC AGENTS (NO CHARGE) OPTIME
TOPICAL | Status: DC | PRN
  Administered 2020-08-03: 1 via TOPICAL

## 2020-09-13 ENCOUNTER — Other Ambulatory Visit: Payer: Self-pay | Admitting: Family Medicine

## 2020-09-13 ENCOUNTER — Ambulatory Visit (INDEPENDENT_AMBULATORY_CARE_PROVIDER_SITE_OTHER): Payer: 59 | Admitting: Family Medicine

## 2020-09-13 ENCOUNTER — Other Ambulatory Visit: Payer: Self-pay

## 2020-09-13 ENCOUNTER — Encounter: Payer: Self-pay | Admitting: Family Medicine

## 2020-09-13 VITALS — BP 111/68 | HR 106 | Temp 98.1°F | Wt 221.8 lb

## 2020-09-13 DIAGNOSIS — E1165 Type 2 diabetes mellitus with hyperglycemia: Secondary | ICD-10-CM | POA: Diagnosis not present

## 2020-09-13 DIAGNOSIS — B370 Candidal stomatitis: Secondary | ICD-10-CM

## 2020-09-13 DIAGNOSIS — E119 Type 2 diabetes mellitus without complications: Secondary | ICD-10-CM | POA: Diagnosis not present

## 2020-09-13 LAB — BAYER DCA HB A1C WAIVED: HB A1C (BAYER DCA - WAIVED): >14 % — ABNORMAL HIGH (ref <7.0)

## 2020-09-13 MED ORDER — NYSTATIN 100000 UNIT/ML MT SUSP: 5.0000 mL | Freq: Four times a day (QID) | OROMUCOSAL | 0 refills | Status: DC

## 2020-09-13 MED ORDER — METFORMIN HCL ER 500 MG PO TB24: ORAL_TABLET | ORAL | 3 refills | Status: DC

## 2020-09-13 MED ORDER — FLUCONAZOLE 150 MG PO TABS: 150.0000 mg | ORAL_TABLET | Freq: Every day | ORAL | 0 refills | Status: AC

## 2020-09-13 NOTE — Telephone Encounter (Signed)
Pharmacy received order.

## 2020-09-13 NOTE — Progress Notes (Signed)
BP 111/68   Pulse (!) 106   Temp 98.1 F (36.7 C) (Oral)   Wt 221 lb 12.8 oz (100.6 kg)   SpO2 98%   BMI 29.26 kg/m    Subjective:    Patient ID: Chase Hull, male    DOB: 05/12/1965, 55 y.o.   MRN: [id number]  HPI: Chase Hull is a 55 y.o. male  Chief Complaint  Patient presents with   Oral Pain    Pt states his mouth has been burning and in pain since last Tuesday or Wednesday. States he thinks he may have thrush   Penis Pain    Pt states he has also had penis pain and swelling around the tip of his penis since last Tuesday and Wednesday    DIABETES Hypoglycemic episodes:no Polydipsia/polyuria: yes Visual disturbance: no Chest pain: no Paresthesias: no Glucose Monitoring: no  Accucheck frequency: Not Checking  Fasting glucose:  Post prandial:  Evening:  Before meals: Taking Insulin?: no  Long acting insulin:  Short acting insulin: Blood Pressure Monitoring: not checking Retinal Examination: Not up to Date Foot Exam: Up to Date Diabetic Education: Completed Pneumovax: Up to Date Influenza: Up to Date Aspirin: yes  Has been having tingling and white stuff in his mouth for the past week. Also having rash and irritation of his penis.   Relevant past medical, surgical, family and social history reviewed and updated as indicated. Interim medical history since our last visit reviewed. Allergies and medications reviewed and updated.  Review of Systems  Constitutional: Negative.   Respiratory: Negative.    Cardiovascular: Negative.   Gastrointestinal: Negative.   Skin:  Positive for color change and rash. Negative for pallor and wound.  Neurological: Negative.   Psychiatric/Behavioral: Negative.     Per HPI unless specifically indicated above     Objective:    BP 111/68   Pulse (!) 106   Temp 98.1 F (36.7 C) (Oral)   Wt 221 lb 12.8 oz (100.6 kg)   SpO2 98%   BMI 29.26 kg/m   Wt Readings from Last 3 Encounters:  09/13/20 221 lb 12.8 oz (100.6  kg)  08/03/20 237 lb (107.5 kg)  05/23/20 225 lb (102.1 kg)    Physical Exam Vitals and nursing note reviewed.  Constitutional:      General: He is not in acute distress.    Appearance: Normal appearance. He is not ill-appearing, toxic-appearing or diaphoretic.  HENT:     Head: Normocephalic and atraumatic.     Right Ear: External ear normal.     Left Ear: External ear normal.     Nose: Nose normal.     Mouth/Throat:     Mouth: Mucous membranes are moist.     Pharynx: Oropharyngeal exudate (significant thrush on his tongue and back of the throat) present.  Eyes:     General: No scleral icterus.       Right eye: No discharge.        Left eye: No discharge.     Extraocular Movements: Extraocular movements intact.     Conjunctiva/sclera: Conjunctivae normal.     Pupils: Pupils are equal, round, and reactive to light.  Cardiovascular:     Rate and Rhythm: Normal rate and regular rhythm.     Pulses: Normal pulses.     Heart sounds: Normal heart sounds. No murmur heard.   No friction rub. No gallop.  Pulmonary:     Effort: Pulmonary effort is normal. No respiratory distress.  Breath sounds: Normal breath sounds. No stridor. No wheezing, rhonchi or rales.  Chest:     Chest wall: No tenderness.  Musculoskeletal:        General: Normal range of motion.     Cervical back: Normal range of motion and neck supple.  Skin:    General: Skin is warm and dry.     Capillary Refill: Capillary refill takes less than 2 seconds.     Coloration: Skin is not jaundiced or pale.     Findings: No bruising, erythema, lesion or rash.  Neurological:     General: No focal deficit present.     Mental Status: He is alert and oriented to person, place, and time. Mental status is at baseline.  Psychiatric:        Mood and Affect: Mood normal.        Behavior: Behavior normal.        Thought Content: Thought content normal.        Judgment: Judgment normal.    Results for orders placed or performed  during the hospital encounter of 08/03/20  Glucose, capillary  Result Value Ref Range   Glucose-Capillary 279 (H) 70 - 99 mg/dL  Glucose, capillary  Result Value Ref Range   Glucose-Capillary 312 (H) 70 - 99 mg/dL  Glucose, capillary  Result Value Ref Range   Glucose-Capillary 282 (H) 70 - 99 mg/dL  I-STAT, chem 8  Result Value Ref Range   Sodium 136 135 - 145 mmol/L   Potassium 3.6 3.5 - 5.1 mmol/L   Chloride 101 98 - 111 mmol/L   BUN 14 6 - 20 mg/dL   Creatinine, Ser 0.70 0.61 - 1.24 mg/dL   Glucose, Bld 274 (H) 70 - 99 mg/dL   Calcium, Ion 1.25 1.15 - 1.40 mmol/L   TCO2 25 22 - 32 mmol/L   Hemoglobin 13.9 13.0 - 17.0 g/dL   HCT 41.0 39.0 - 52.0 %  ABO/Rh  Result Value Ref Range   ABO/RH(D)      O POS Performed at [facility], [address] [place], [place] [address]       Assessment & Plan:   Problem List Items Addressed This Visit       Endocrine   Type 2 diabetes mellitus with hyperglycemia ([place])    Not under good control with A1c of >14. Likely the cause of his thrush. We will start him on metformin and recheck 3 months. Call with any concerns.        Relevant Medications   metFORMIN (GLUCOPHAGE XR) 500 MG 24 hr tablet   Other Visit Diagnoses     Thrush    -  Primary   Will treat with nystatin and diflucan. Call if not getting better or getting worse.   Relevant Medications   fluconazole (DIFLUCAN) 150 MG tablet   nystatin (MYCOSTATIN) 100000 UNIT/ML suspension        Follow up plan: Return in about 3 months (around 12/14/2020).

## 2020-09-13 NOTE — Assessment & Plan Note (Signed)
Not under good control with A1c of >14. Likely the cause of his thrush. We will start him on metformin and recheck 3 months. Call with any concerns.

## 2020-10-24 ENCOUNTER — Other Ambulatory Visit: Payer: Self-pay

## 2020-10-24 ENCOUNTER — Ambulatory Visit (INDEPENDENT_AMBULATORY_CARE_PROVIDER_SITE_OTHER): Payer: 59 | Admitting: Family Medicine

## 2020-10-24 ENCOUNTER — Encounter: Payer: Self-pay | Admitting: Family Medicine

## 2020-10-24 VITALS — BP 123/89 | HR 77 | Temp 97.7°F | Ht 72.99 in | Wt 225.2 lb

## 2020-10-24 DIAGNOSIS — E1165 Type 2 diabetes mellitus with hyperglycemia: Secondary | ICD-10-CM

## 2020-10-24 DIAGNOSIS — M1A0690 Idiopathic chronic gout, unspecified knee, without tophus (tophi): Secondary | ICD-10-CM | POA: Diagnosis not present

## 2020-10-24 DIAGNOSIS — E782 Mixed hyperlipidemia: Secondary | ICD-10-CM | POA: Diagnosis not present

## 2020-10-24 DIAGNOSIS — I10 Essential (primary) hypertension: Secondary | ICD-10-CM | POA: Diagnosis not present

## 2020-10-24 DIAGNOSIS — Z125 Encounter for screening for malignant neoplasm of prostate: Secondary | ICD-10-CM

## 2020-10-24 DIAGNOSIS — Z0000 Encounter for general adult medical examination without abnormal findings: Secondary | ICD-10-CM | POA: Diagnosis not present

## 2020-10-24 DIAGNOSIS — Z20820 Contact with and (suspected) exposure to varicella: Secondary | ICD-10-CM

## 2020-10-24 LAB — MICROALBUMIN, URINE WAIVED
Creatinine, Urine Waived: 200 mg/dL (ref 10–300)
Microalb, Ur Waived: 80 mg/L — ABNORMAL HIGH (ref 0–19)

## 2020-10-24 LAB — URINALYSIS, ROUTINE W REFLEX MICROSCOPIC
Bilirubin, UA: NEGATIVE
Ketones, UA: NEGATIVE
Leukocytes,UA: NEGATIVE
Nitrite, UA: NEGATIVE
RBC, UA: NEGATIVE
Specific Gravity, UA: 1.02 (ref 1.005–1.030)
Urobilinogen, Ur: 0.2 mg/dL (ref 0.2–1.0)
pH, UA: 6 (ref 5.0–7.5)

## 2020-10-24 MED ORDER — LISINOPRIL 20 MG PO TABS: 20.0000 mg | ORAL_TABLET | Freq: Every day | ORAL | 1 refills | Status: DC

## 2020-10-24 MED ORDER — ALLOPURINOL 100 MG PO TABS: 200.0000 mg | ORAL_TABLET | Freq: Every day | ORAL | 1 refills | Status: DC

## 2020-10-24 MED ORDER — METFORMIN HCL ER 500 MG PO TB24: ORAL_TABLET | ORAL | 1 refills | Status: DC

## 2020-10-24 MED ORDER — AMLODIPINE BESYLATE 10 MG PO TABS: 10.0000 mg | ORAL_TABLET | Freq: Every day | ORAL | 1 refills | Status: DC

## 2020-10-24 NOTE — Assessment & Plan Note (Signed)
Under good control on current regimen. Continue current regimen. Continue to monitor. Call with any concerns. Refills given. Labs drawn today.   

## 2020-10-24 NOTE — Progress Notes (Signed)
BP 123/89   Pulse 77   Temp 97.7 F (36.5 C) (Oral)   Ht 6' 0.99" (1.854 m)   Wt 225 lb 3.2 oz (102.2 kg)   SpO2 98%   BMI 29.72 kg/m    Subjective:    Patient ID: Chase Hull, male    DOB: 01/30/1966, 55 y.o.   MRN: [id number]  HPI: Chase Hull is a 55 y.o. male presenting on 10/24/2020 for comprehensive medical examination. Current medical complaints include:  DIABETES Hypoglycemic episodes:no Polydipsia/polyuria: no Visual disturbance: no Chest pain: no Paresthesias: no Glucose Monitoring: no  Accucheck frequency: Not Checking Taking Insulin?: no Blood Pressure Monitoring: not checking Retinal Examination: Not up to Date Foot Exam: Up to Date Diabetic Education: Completed Pneumovax: Up to Date Influenza: Up to Date Aspirin: yes  HYPERTENSION / HYPERLIPIDEMIA Satisfied with current treatment? no Duration of hypertension: chronic BP monitoring frequency: not checking BP range:  BP medication side effects: no Past BP meds: lisinopril, amlodipine Duration of hyperlipidemia: chronic Cholesterol medication side effects: not on anything Cholesterol supplements: none Past cholesterol medications: none Medication compliance: excellent compliance Aspirin: yes Recent stressors: no Recurrent headaches: no Visual changes: no Palpitations: no Dyspnea: no Chest pain: no Lower extremity edema: no Dizzy/lightheaded: no  No gout flares. Tolerating his allopurinol. Feeling well.   Interim Problems from his last visit: no  Depression Screen done today and results listed below:  Depression screen [facility] 2/9 10/24/2020 02/17/2020 03/12/2019 12/20/2017  Decreased Interest 0 0 0 0  Down, Depressed, Hopeless 0 0 0 0  PHQ - 2 Score 0 0 0 0  Altered sleeping 0 - - 0  Tired, decreased energy 0 - - 0  Change in appetite 0 - - 0  Feeling bad or failure about yourself  0 - - 0  Trouble concentrating 0 - - 0  Moving slowly or fidgety/restless 0 - - 0  Suicidal thoughts 0 -  - 0  PHQ-9 Score 0 - - 0  Difficult doing work/chores Not difficult at all - - Not difficult at all     Past Medical History:  Past Medical History:  Diagnosis Date   Carpal tunnel syndrome    Diabetes mellitus without complication (HCC)    no meds, A1C down   Gout    Hypertension     Surgical History:  Past Surgical History:  Procedure Laterality Date   COLONOSCOPY WITH PROPOFOL N/A 03/30/2019   Procedure: COLONOSCOPY WITH BIOPSY;  Surgeon: Jonathon Bellows, MD;  Location: [place];  Service: Endoscopy;  Laterality: N/A;  Priority 4   FRACTURE SURGERY     left ankle    Left hand surgery  03/2018   Carpal tunnel   LUMBAR LAMINECTOMY/DECOMPRESSION MICRODISCECTOMY N/A 08/03/2020   Procedure: L3-4 AND L5-S1 DECOMPRESSION, RIGHT L4-5 MICRODISCECTOMY;  Surgeon: Meade Maw, MD;  Location: [facility];  Service: Neurosurgery;  Laterality: N/A;   POLYPECTOMY N/A 03/30/2019   Procedure: POLYPECTOMY;  Surgeon: Jonathon Bellows, MD;  Location: [place];  Service: Endoscopy;  Laterality: N/A;   TONSILLECTOMY     adniods   TOOTH EXTRACTION      Medications:  Current Outpatient Medications on File Prior to Visit  Medication Sig   aspirin 81 MG tablet Take 81 mg by mouth daily.   Aspirin-Salicylamide-Caffeine (BC HEADACHE PO) Take 1 packet by mouth daily as needed (headaches).   Blood Glucose Monitoring Suppl (FIFTY50 GLUCOSE METER 2.0) w/Device KIT Use to check fasting blood sugars daily  nystatin (MYCOSTATIN) 100000 UNIT/ML suspension Take 5 mLs (500,000 Units total) by mouth 4 (four) times daily. (Patient not taking: Reported on 10/24/2020)   No current facility-administered medications on file prior to visit.    Allergies:  Allergies  Allergen Reactions   Gabapentin Swelling    Social History:  Social History   Socioeconomic History   Marital status: Single    Spouse name: Not on file   Number of children: Not on file   Years of education: Not on file    Highest education level: Not on file  Occupational History   Not on file  Tobacco Use   Smoking status: Every Day    Packs/day: 0.50    Years: 25.00    Pack years: 12.50    Types: Cigarettes   Smokeless tobacco: Never  Vaping Use   Vaping Use: Never used  Substance and Sexual Activity   Alcohol use: Not Currently   Drug use: Never   Sexual activity: Not Currently  Other Topics Concern   Not on file  Social History Narrative   Lives with Mom   Social Determinants of Health   Financial Resource Strain: Not on file  Food Insecurity: Not on file  Transportation Needs: Not on file  Physical Activity: Not on file  Stress: Not on file  Social Connections: Not on file  Intimate Partner Violence: Not on file   Social History   Tobacco Use  Smoking Status Every Day   Packs/day: 0.50   Years: 25.00   Pack years: 12.50   Types: Cigarettes  Smokeless Tobacco Never   Social History   Substance and Sexual Activity  Alcohol Use Not Currently    Family History:  History reviewed. No pertinent family history.  Past medical history, surgical history, medications, allergies, family history and social history reviewed with patient today and changes made to appropriate areas of the chart.   Review of Systems  Constitutional: Negative.   HENT:  Positive for ear pain. Negative for congestion, ear discharge, hearing loss, nosebleeds, sinus pain, sore throat and tinnitus.   Eyes: Negative.   Respiratory: Negative.  Negative for stridor.   Cardiovascular: Negative.   Gastrointestinal:  Positive for heartburn. Negative for abdominal pain, blood in stool, constipation, diarrhea, melena, nausea and vomiting.  Genitourinary: Negative.   Musculoskeletal: Negative.   Skin: Negative.   Neurological: Negative.   Endo/Heme/Allergies: Negative.   Psychiatric/Behavioral: Negative.    All other ROS negative except what is listed above and in the HPI.      Objective:    BP 123/89    Pulse 77   Temp 97.7 F (36.5 C) (Oral)   Ht 6' 0.99" (1.854 m)   Wt 225 lb 3.2 oz (102.2 kg)   SpO2 98%   BMI 29.72 kg/m   Wt Readings from Last 3 Encounters:  10/24/20 225 lb 3.2 oz (102.2 kg)  09/13/20 221 lb 12.8 oz (100.6 kg)  08/03/20 237 lb (107.5 kg)    Physical Exam Vitals and nursing note reviewed.  Constitutional:      General: He is not in acute distress.    Appearance: Normal appearance. He is obese. He is not ill-appearing, toxic-appearing or diaphoretic.  HENT:     Head: Normocephalic and atraumatic.     Right Ear: Tympanic membrane, ear canal and external ear normal. There is no impacted cerumen.     Left Ear: Tympanic membrane, ear canal and external ear normal. There is no impacted cerumen.  Nose: Nose normal. No congestion or rhinorrhea.     Mouth/Throat:     Mouth: Mucous membranes are moist.     Pharynx: Oropharynx is clear. No oropharyngeal exudate or posterior oropharyngeal erythema.  Eyes:     General: No scleral icterus.       Right eye: No discharge.        Left eye: No discharge.     Extraocular Movements: Extraocular movements intact.     Conjunctiva/sclera: Conjunctivae normal.     Pupils: Pupils are equal, round, and reactive to light.  Neck:     Vascular: No carotid bruit.  Cardiovascular:     Rate and Rhythm: Normal rate and regular rhythm.     Pulses: Normal pulses.     Heart sounds: No murmur heard.   No friction rub. No gallop.  Pulmonary:     Effort: Pulmonary effort is normal. No respiratory distress.     Breath sounds: Normal breath sounds. No stridor. No wheezing, rhonchi or rales.  Chest:     Chest wall: No tenderness.  Abdominal:     General: Abdomen is flat. Bowel sounds are normal. There is no distension.     Palpations: Abdomen is soft. There is no mass.     Tenderness: There is no abdominal tenderness. There is no right CVA tenderness, left CVA tenderness, guarding or rebound.     Hernia: No hernia is present.   Genitourinary:    Comments: Genital exam deferred with shared decision making Musculoskeletal:        General: No swelling, tenderness, deformity or signs of injury. Normal range of motion.     Cervical back: Normal range of motion and neck supple. No rigidity. No muscular tenderness.     Right lower leg: No edema.     Left lower leg: No edema.  Lymphadenopathy:     Cervical: No cervical adenopathy.  Skin:    General: Skin is warm and dry.     Capillary Refill: Capillary refill takes less than 2 seconds.     Coloration: Skin is not jaundiced or pale.     Findings: No bruising, erythema, lesion or rash.  Neurological:     General: No focal deficit present.     Mental Status: He is alert and oriented to person, place, and time.     Cranial Nerves: No cranial nerve deficit.     Sensory: No sensory deficit.     Motor: No weakness.     Coordination: Coordination normal.     Gait: Gait normal.     Deep Tendon Reflexes: Reflexes normal.  Psychiatric:        Mood and Affect: Mood normal.        Behavior: Behavior normal.        Thought Content: Thought content normal.        Judgment: Judgment normal.    Results for orders placed or performed in visit on 10/24/20  Urinalysis, Routine w reflex microscopic  Result Value Ref Range   Specific Gravity, UA 1.020 1.005 - 1.030   pH, UA 6.0 5.0 - 7.5   Color, UA Yellow Yellow   Appearance Ur Clear Clear   Leukocytes,UA Negative Negative   Protein,UA Trace (A) Negative/Trace   Glucose, UA 3+ (A) Negative   Ketones, UA Negative Negative   RBC, UA Negative Negative   Bilirubin, UA Negative Negative   Urobilinogen, Ur 0.2 0.2 - 1.0 mg/dL   Nitrite, UA Negative Negative  Microalbumin, Urine Waived  Result Value Ref Range   Microalb, Ur Waived 80 (H) 0 - 19 mg/L   Creatinine, Urine Waived 200 10 - 300 mg/dL   Microalb/Creat Ratio 30-300 (H) <30 mg/g      Assessment & Plan:   Problem List Items Addressed This Visit        Cardiovascular and Mediastinum   Hypertension    Under good control on current regimen. Continue current regimen. Continue to monitor. Call with any concerns. Refills given. Labs drawn today.       Relevant Medications   lisinopril (ZESTRIL) 20 MG tablet   amLODipine (NORVASC) 10 MG tablet   Other Relevant Orders   Comprehensive metabolic panel   CBC with Differential/Platelet   TSH   Urinalysis, Routine w reflex microscopic (Completed)   Microalbumin, Urine Waived (Completed)     Endocrine   Type 2 diabetes mellitus with hyperglycemia (HCC)    Feeling much better on his metformin. Symptoms better. Will recheck labs in 2 months. Call with any concerns. Continue current regimen. Refills given today.      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 Continue current regimen. Continue to monitor. Call with any concerns. Refills given. Labs drawn today.      Relevant Medications   allopurinol (ZYLOPRIM) 100 MG tablet   Other Relevant Orders   Comprehensive metabolic panel   CBC with Differential/Platelet   Uric acid     Other   Hyperlipidemia    Under good control on current regimen. Continue current regimen. Continue to monitor. Call with any concerns. Refills given. Labs drawn today.      Relevant Medications   lisinopril (ZESTRIL) 20 MG tablet   amLODipine (NORVASC) 10 MG tablet   Other Relevant Orders   Comprehensive metabolic panel   CBC with Differential/Platelet   Lipid Panel w/o Chol/HDL Ratio   Other Visit Diagnoses     Routine general medical  examination at a health care facility    -  Primary   Vaccines up to date. Screening labs checked today. Colonoscopy up to date. Call with any concerns. Continue diet and exercise.    Screening for prostate cancer       Labs drawn today. Await results.    Relevant Orders   PSA   Exposure to chickenpox       Labs drawn today. Await results.    Relevant Orders   Varicella Zoster Abs, IgG/IgM        Discussed aspirin prophylaxis for myocardial infarction prevention and decision was made to continue ASA  LABORATORY TESTING:  Health maintenance labs ordered today as discussed above.   The natural history of prostate cancer and ongoing controversy regarding screening and potential treatment outcomes of prostate cancer has been discussed with the patient. The meaning of a false positive PSA and a false negative PSA has been discussed. He indicates understanding of the limitations of this screening test and wishes to proceed with screening PSA testing.   IMMUNIZATIONS:   - Tdap: Tetanus vaccination status reviewed: last tetanus booster within 10 years. - Influenza: Postponed  to flu season - Pneumovax: Up to date - Prevnar: Not applicable - COVID: Refused - Shingrix vaccine: Refused  SCREENING: - Colonoscopy: Up to date  Discussed with patient purpose of the colonoscopy is to detect colon cancer at curable precancerous or early stages   PATIENT COUNSELING:    Sexuality: Discussed sexually transmitted diseases, partner selection, use of condoms, avoidance of unintended pregnancy  and contraceptive alternatives.   Advised to avoid cigarette smoking.  I discussed with the patient that most people either abstain from alcohol or drink within safe limits (<=14/week and <=4 drinks/occasion for males, <=7/weeks and <= 3 drinks/occasion for females) and that the risk for alcohol disorders and other health effects rises proportionally with the number of drinks per week and how often a drinker  exceeds daily limits.  Discussed cessation/primary prevention of drug use and availability of treatment for abuse.   Diet: Encouraged to adjust caloric intake to maintain  or achieve ideal body weight, to reduce intake of dietary saturated fat and total fat, to limit sodium intake by avoiding high sodium foods and not adding table salt, and to maintain adequate dietary potassium and calcium preferably from fresh fruits, vegetables, and low-fat dairy products.    stressed the importance of regular exercise  Injury prevention: Discussed safety belts, safety helmets, smoke detector, smoking near bedding or upholstery.   Dental health: Discussed importance of regular tooth brushing, flossing, and dental visits.   Follow up plan: NEXT PREVENTATIVE PHYSICAL DUE IN 1 YEAR. Return in about 2 months (around 12/24/2020) for follow up DM.

## 2020-10-24 NOTE — Assessment & Plan Note (Signed)
Feeling much better on his metformin. Symptoms better. Will recheck labs in 2 months. Call with any concerns. Continue current regimen. Refills given today.

## 2020-10-25 ENCOUNTER — Encounter: Payer: Self-pay | Admitting: Family Medicine

## 2020-10-26 LAB — CBC WITH DIFFERENTIAL/PLATELET
Basophils Absolute: 0 10*3/uL (ref 0.0–0.2)
Basos: 0 %
EOS (ABSOLUTE): 0.1 10*3/uL (ref 0.0–0.4)
Eos: 1 %
Hematocrit: 43.3 % (ref 37.5–51.0)
Hemoglobin: 14.9 g/dL (ref 13.0–17.7)
Immature Grans (Abs): 0 10*3/uL (ref 0.0–0.1)
Immature Granulocytes: 0 %
Lymphocytes Absolute: 1.5 10*3/uL (ref 0.7–3.1)
Lymphs: 16 %
MCH: 31.6 pg (ref 26.6–33.0)
MCHC: 34.4 g/dL (ref 31.5–35.7)
MCV: 92 fL (ref 79–97)
Monocytes Absolute: 0.5 10*3/uL (ref 0.1–0.9)
Monocytes: 5 %
Neutrophils Absolute: 7.3 10*3/uL — ABNORMAL HIGH (ref 1.4–7.0)
Neutrophils: 78 %
Platelets: 243 10*3/uL (ref 150–450)
RBC: 4.71 x10E6/uL (ref 4.14–5.80)
RDW: 12.3 % (ref 11.6–15.4)
WBC: 9.4 10*3/uL (ref 3.4–10.8)

## 2020-10-26 LAB — COMPREHENSIVE METABOLIC PANEL
ALT: 17 IU/L (ref 0–44)
AST: 14 IU/L (ref 0–40)
Albumin/Globulin Ratio: 1.7 (ref 1.2–2.2)
Albumin: 4.2 g/dL (ref 3.8–4.9)
Alkaline Phosphatase: 142 IU/L — ABNORMAL HIGH (ref 44–121)
BUN/Creatinine Ratio: 7 — ABNORMAL LOW (ref 9–20)
BUN: 6 mg/dL (ref 6–24)
Bilirubin Total: 0.4 mg/dL (ref 0.0–1.2)
CO2: 24 mmol/L (ref 20–29)
Calcium: 10 mg/dL (ref 8.7–10.2)
Chloride: 99 mmol/L (ref 96–106)
Creatinine, Ser: 0.88 mg/dL (ref 0.76–1.27)
Globulin, Total: 2.5 g/dL (ref 1.5–4.5)
Glucose: 245 mg/dL — ABNORMAL HIGH (ref 65–99)
Potassium: 3.9 mmol/L (ref 3.5–5.2)
Sodium: 140 mmol/L (ref 134–144)
Total Protein: 6.7 g/dL (ref 6.0–8.5)
eGFR: 103 mL/min/{1.73_m2} (ref >59)

## 2020-10-26 LAB — LIPID PANEL W/O CHOL/HDL RATIO
Cholesterol, Total: 188 mg/dL (ref 100–199)
HDL: 38 mg/dL — ABNORMAL LOW (ref >39)
LDL Chol Calc (NIH): 118 mg/dL — ABNORMAL HIGH (ref 0–99)
Triglycerides: 178 mg/dL — ABNORMAL HIGH (ref 0–149)
VLDL Cholesterol Cal: 32 mg/dL (ref 5–40)

## 2020-10-26 LAB — TSH: TSH: 0.839 u[IU]/mL (ref 0.450–4.500)

## 2020-10-26 LAB — URIC ACID: Uric Acid: 3.3 mg/dL — ABNORMAL LOW (ref 3.8–8.4)

## 2020-10-26 LAB — VARICELLA ZOSTER ABS, IGG/IGM
Varicella IgM: <0.91 index (ref 0.00–0.90)
Varicella zoster IgG: 481 index (ref >165)

## 2020-10-26 LAB — PSA: Prostate Specific Ag, Serum: 1.4 ng/mL (ref 0.0–4.0)

## 2020-12-24 NOTE — Progress Notes (Signed)
BP 129/86   Pulse 87   Temp 98.1 F (36.7 C)   Ht 6' (1.829 m)   Wt 225 lb (102.1 kg)   SpO2 98%   BMI 30.52 kg/m    Subjective:    Patient ID: Chase Hull, male    DOB: 1965/12/26, 55 y.o.   MRN: [id number]  HPI: Chase Hull is a 55 y.o. male  Chief Complaint  Patient presents with   Diabetes   DIABETES Hypoglycemic episodes:no Polydipsia/polyuria: no Visual disturbance: no Chest pain: no Paresthesias: no Glucose Monitoring: yes  Accucheck frequency:  occasionally Taking Insulin?: no Blood Pressure Monitoring: not checking Retinal Examination: Not up to Date Foot Exam: Up to Date Diabetic Education: Completed Pneumovax: Up to Date Influenza: Up to Date Aspirin: no   Relevant past medical, surgical, family and social history reviewed and updated as indicated. Interim medical history since our last visit reviewed. Allergies and medications reviewed and updated.  Review of Systems  Constitutional: Negative.   Respiratory: Negative.    Cardiovascular: Negative.   Gastrointestinal: Negative.   Musculoskeletal: Negative.   Psychiatric/Behavioral: Negative.     Per HPI unless specifically indicated above     Objective:    BP 129/86   Pulse 87   Temp 98.1 F (36.7 C)   Ht 6' (1.829 m)   Wt 225 lb (102.1 kg)   SpO2 98%   BMI 30.52 kg/m   Wt Readings from Last 3 Encounters:  12/26/20 225 lb (102.1 kg)  10/24/20 225 lb 3.2 oz (102.2 kg)  09/13/20 221 lb 12.8 oz (100.6 kg)    Physical Exam Vitals and nursing note reviewed.  Constitutional:      General: He is not in acute distress.    Appearance: Normal appearance. He is not ill-appearing, toxic-appearing or diaphoretic.  HENT:     Head: Normocephalic and atraumatic.     Right Ear: External ear normal.     Left Ear: External ear normal.     Nose: Nose normal.     Mouth/Throat:     Mouth: Mucous membranes are moist.     Pharynx: Oropharynx is clear.  Eyes:     General: No scleral icterus.        Right eye: No discharge.        Left eye: No discharge.     Extraocular Movements: Extraocular movements intact.     Conjunctiva/sclera: Conjunctivae normal.     Pupils: Pupils are equal, round, and reactive to light.  Cardiovascular:     Rate and Rhythm: Normal rate and regular rhythm.     Pulses: Normal pulses.     Heart sounds: Normal heart sounds. No murmur heard.   No friction rub. No gallop.  Pulmonary:     Effort: Pulmonary effort is normal. No respiratory distress.     Breath sounds: Normal breath sounds. No stridor. No wheezing, rhonchi or rales.  Chest:     Chest wall: No tenderness.  Musculoskeletal:        General: Normal range of motion.     Cervical back: Normal range of motion and neck supple.  Skin:    General: Skin is warm and dry.     Capillary Refill: Capillary refill takes less than 2 seconds.     Coloration: Skin is not jaundiced or pale.     Findings: No bruising, erythema, lesion or rash.  Neurological:     General: No focal deficit present.     Mental Status:  He is alert and oriented to person, place, and time. Mental status is at baseline.  Psychiatric:        Mood and Affect: Mood normal.        Behavior: Behavior normal.        Thought Content: Thought content normal.        Judgment: Judgment normal.    Results for orders placed or performed in visit on 10/24/20  Comprehensive metabolic panel  Result Value Ref Range   Glucose 245 (H) 65 - 99 mg/dL   BUN 6 6 - 24 mg/dL   Creatinine, Ser 0.88 0.76 - 1.27 mg/dL   eGFR 103 >59 mL/min/1.73   BUN/Creatinine Ratio 7 (L) 9 - 20   Sodium 140 134 - 144 mmol/L   Potassium 3.9 3.5 - 5.2 mmol/L   Chloride 99 96 - 106 mmol/L   CO2 24 20 - 29 mmol/L   Calcium 10.0 8.7 - 10.2 mg/dL   Total Protein 6.7 6.0 - 8.5 g/dL   Albumin 4.2 3.8 - 4.9 g/dL   Globulin, Total 2.5 1.5 - 4.5 g/dL   Albumin/Globulin Ratio 1.7 1.2 - 2.2   Bilirubin Total 0.4 0.0 - 1.2 mg/dL   Alkaline Phosphatase 142 (H) 44 - 121  IU/L   AST 14 0 - 40 IU/L   ALT 17 0 - 44 IU/L  CBC with Differential/Platelet  Result Value Ref Range   WBC 9.4 3.4 - 10.8 x10E3/uL   RBC 4.71 4.14 - 5.80 x10E6/uL   Hemoglobin 14.9 13.0 - 17.7 g/dL   Hematocrit 43.3 37.5 - 51.0 %   MCV 92 79 - 97 fL   MCH 31.6 26.6 - 33.0 pg   MCHC 34.4 31.5 - 35.7 g/dL   RDW 12.3 11.6 - 15.4 %   Platelets 243 150 - 450 x10E3/uL   Neutrophils 78 Not Estab. %   Lymphs 16 Not Estab. %   Monocytes 5 Not Estab. %   Eos 1 Not Estab. %   Basos 0 Not Estab. %   Neutrophils Absolute 7.3 (H) 1.4 - 7.0 x10E3/uL   Lymphocytes Absolute 1.5 0.7 - 3.1 x10E3/uL   Monocytes Absolute 0.5 0.1 - 0.9 x10E3/uL   EOS (ABSOLUTE) 0.1 0.0 - 0.4 x10E3/uL   Basophils Absolute 0.0 0.0 - 0.2 x10E3/uL   Immature Granulocytes 0 Not Estab. %   Immature Grans (Abs) 0.0 0.0 - 0.1 x10E3/uL  Lipid Panel w/o Chol/HDL Ratio  Result Value Ref Range   Cholesterol, Total 188 100 - 199 mg/dL   Triglycerides 178 (H) 0 - 149 mg/dL   HDL 38 (L) >39 mg/dL   VLDL Cholesterol Cal 32 5 - 40 mg/dL   LDL Chol Calc (NIH) 118 (H) 0 - 99 mg/dL  PSA  Result Value Ref Range   Prostate Specific Ag, Serum 1.4 0.0 - 4.0 ng/mL  TSH  Result Value Ref Range   TSH 0.839 0.450 - 4.500 uIU/mL  Urinalysis, Routine w reflex microscopic  Result Value Ref Range   Specific Gravity, UA 1.020 1.005 - 1.030   pH, UA 6.0 5.0 - 7.5   Color, UA Yellow Yellow   Appearance Ur Clear Clear   Leukocytes,UA Negative Negative   Protein,UA Trace (A) Negative/Trace   Glucose, UA 3+ (A) Negative   Ketones, UA Negative Negative   RBC, UA Negative Negative   Bilirubin, UA Negative Negative   Urobilinogen, Ur 0.2 0.2 - 1.0 mg/dL   Nitrite, UA Negative Negative  Microalbumin, Urine Waived  Result Value Ref Range   Microalb, Ur Waived 80 (H) 0 - 19 mg/L   Creatinine, Urine Waived 200 10 - 300 mg/dL   Microalb/Creat Ratio 30-300 (H) <30 mg/g  Uric acid  Result Value Ref Range   Uric Acid 3.3 (L) 3.8 - 8.4 mg/dL   Varicella Zoster Abs, IgG/IgM  Result Value Ref Range   Varicella zoster IgG 481 Immune >165 index   Varicella IgM <0.91 0.00 - 0.90 index      Assessment & Plan:   Problem List Items Addressed This Visit       Endocrine   Type 2 diabetes mellitus with hyperglycemia ([place]) - Primary    Under good control on current regimen with A1c of 6.9. Continue current regimen. Continue to monitor. Call with any concerns. Refills up to date.        Relevant Orders   Bayer DCA Hb A1c Waived   Other Visit Diagnoses     Needs flu shot       Relevant Orders   Flu Vaccine QUAD 6+ mos PF IM (Fluarix Quad PF)        Follow up plan: Return in about 3 months (around 03/28/2021).

## 2020-12-26 ENCOUNTER — Ambulatory Visit (INDEPENDENT_AMBULATORY_CARE_PROVIDER_SITE_OTHER): Payer: 59 | Admitting: Family Medicine

## 2020-12-26 ENCOUNTER — Encounter: Payer: Self-pay | Admitting: Family Medicine

## 2020-12-26 ENCOUNTER — Other Ambulatory Visit: Payer: Self-pay

## 2020-12-26 VITALS — BP 129/86 | HR 87 | Temp 98.1°F | Ht 72.0 in | Wt 225.0 lb

## 2020-12-26 DIAGNOSIS — E1165 Type 2 diabetes mellitus with hyperglycemia: Secondary | ICD-10-CM

## 2020-12-26 DIAGNOSIS — Z23 Encounter for immunization: Secondary | ICD-10-CM | POA: Diagnosis not present

## 2020-12-26 LAB — BAYER DCA HB A1C WAIVED: HB A1C (BAYER DCA - WAIVED): 6.9 % — ABNORMAL HIGH (ref 4.8–5.6)

## 2020-12-26 NOTE — Assessment & Plan Note (Signed)
Under good control on current regimen with A1c of 6.9. Continue current regimen. Continue to monitor. Call with any concerns. Refills up to date.

## 2020-12-29 ENCOUNTER — Ambulatory Visit: Payer: 59 | Admitting: Family Medicine

## 2021-01-03 ENCOUNTER — Other Ambulatory Visit: Payer: Self-pay

## 2021-01-05 MED ORDER — ONETOUCH DELICA LANCETS 33G MISC: 1.0000 | Freq: Every day | 3 refills | Status: AC

## 2021-01-05 MED ORDER — ONETOUCH ULTRA 2 W/DEVICE KIT: 1.0000 | PACK | Freq: Every day | 0 refills | Status: DC

## 2021-01-05 MED ORDER — ONETOUCH ULTRA VI STRP: 1.0000 | ORAL_STRIP | Freq: Every day | 3 refills | Status: DC

## 2021-02-26 ENCOUNTER — Other Ambulatory Visit: Payer: Self-pay | Admitting: Family Medicine

## 2021-02-28 NOTE — Telephone Encounter (Signed)
Requested Prescriptions  Pending Prescriptions Disp Refills   allopurinol (ZYLOPRIM) 100 MG tablet [Pharmacy Med Name: ALLOPURINOL 100 MG TABLET] 180 tablet 0    Sig: TAKE 2 TABLETS BY MOUTH EVERY DAY     Endocrinology:  Gout Agents Failed - 02/26/2021  1:51 AM      Failed - Uric Acid in normal range and within 360 days    Uric Acid  Date Value Ref Range Status  10/24/2020 3.3 (L) 3.8 - 8.4 mg/dL Final    Comment:               Therapeutic target for gout patients: <6.0         Passed - Cr in normal range and within 360 days    Creatinine, Ser  Date Value Ref Range Status  10/24/2020 0.88 0.76 - 1.27 mg/dL Final         Passed - Valid encounter within last 12 months    Recent Outpatient Visits          2 months ago Type 2 diabetes mellitus with hyperglycemia, without long-term current use of insulin ([place])   [place], Megan P, DO   4 months ago Routine general medical examination at a health care facility   [facility], [place] P, DO   5 months ago [place], Megan P, DO   10 months ago Diabetes mellitus without complication [facility])   [place], Megan P, DO   1 year ago Acute bilateral low back pain with right-sided sciatica   [facility] Valerie Roys, DO      Future Appointments            In 4 weeks Wynetta Emery, Barb Merino, DO [facility]

## 2021-03-28 ENCOUNTER — Encounter: Payer: Self-pay | Admitting: Family Medicine

## 2021-03-28 ENCOUNTER — Other Ambulatory Visit: Payer: Self-pay

## 2021-03-28 ENCOUNTER — Ambulatory Visit (INDEPENDENT_AMBULATORY_CARE_PROVIDER_SITE_OTHER): Payer: 59 | Admitting: Family Medicine

## 2021-03-28 VITALS — BP 115/75 | HR 83 | Temp 98.3°F | Ht 72.0 in | Wt 221.0 lb

## 2021-03-28 DIAGNOSIS — I10 Essential (primary) hypertension: Secondary | ICD-10-CM | POA: Diagnosis not present

## 2021-03-28 DIAGNOSIS — M1A0690 Idiopathic chronic gout, unspecified knee, without tophus (tophi): Secondary | ICD-10-CM

## 2021-03-28 DIAGNOSIS — J0100 Acute maxillary sinusitis, unspecified: Secondary | ICD-10-CM

## 2021-03-28 DIAGNOSIS — N529 Male erectile dysfunction, unspecified: Secondary | ICD-10-CM

## 2021-03-28 DIAGNOSIS — E1165 Type 2 diabetes mellitus with hyperglycemia: Secondary | ICD-10-CM | POA: Diagnosis not present

## 2021-03-28 DIAGNOSIS — E782 Mixed hyperlipidemia: Secondary | ICD-10-CM | POA: Diagnosis not present

## 2021-03-28 LAB — BAYER DCA HB A1C WAIVED: HB A1C (BAYER DCA - WAIVED): 6.5 % — ABNORMAL HIGH (ref 4.8–5.6)

## 2021-03-28 MED ORDER — AMLODIPINE BESYLATE 10 MG PO TABS: 10.0000 mg | ORAL_TABLET | Freq: Every day | ORAL | 1 refills | Status: DC

## 2021-03-28 MED ORDER — ALLOPURINOL 100 MG PO TABS: 200.0000 mg | ORAL_TABLET | Freq: Every day | ORAL | 1 refills | Status: DC

## 2021-03-28 MED ORDER — AMOXICILLIN-POT CLAVULANATE 875-125 MG PO TABS: 1.0000 | ORAL_TABLET | Freq: Two times a day (BID) | ORAL | 0 refills | Status: DC

## 2021-03-28 MED ORDER — TADALAFIL 20 MG PO TABS: 10.0000 mg | ORAL_TABLET | ORAL | 11 refills | Status: DC | PRN

## 2021-03-28 MED ORDER — METFORMIN HCL ER 500 MG PO TB24: 1000.0000 mg | ORAL_TABLET | Freq: Two times a day (BID) | ORAL | 1 refills | Status: DC

## 2021-03-28 MED ORDER — LISINOPRIL 20 MG PO TABS: 20.0000 mg | ORAL_TABLET | Freq: Every day | ORAL | 1 refills | Status: DC

## 2021-03-28 NOTE — Assessment & Plan Note (Signed)
Under good control on current regimen. Continue current regimen. Continue to monitor. Call with any concerns. Refills given. Labs drawn today.   

## 2021-03-28 NOTE — Assessment & Plan Note (Signed)
Doing well with a1c of 6.5. Continue current regimen. Continue to monitor. Recheck 6 months. Call with any concerns.

## 2021-03-28 NOTE — Assessment & Plan Note (Signed)
Will start cialis. Call with any concerns.

## 2021-03-28 NOTE — Progress Notes (Signed)
BP 115/75    Pulse 83    Temp 98.3 F (36.8 C)    Ht 6' (1.829 m)    Wt 221 lb (100.2 kg)    SpO2 98%    BMI 29.97 kg/m    Subjective:    Patient ID: Chase Hull, male    DOB: [birth year], 56 y.o.   MRN: [id number]  HPI: Chase Hull is a 56 y.o. male  Chief Complaint  Patient presents with   Diabetes   Sinusitis    Patient states he has been having sinus pressure, stuffy nose. Patient states he has not had any cough or sneezing.    HYPERTENSION / HYPERLIPIDEMIA Satisfied with current treatment? yes Duration of hypertension: chronic BP monitoring frequency: not checking BP medication side effects: no Past BP meds: lisinopril, amlodipine Duration of hyperlipidemia: chronic Cholesterol medication side effects: no Cholesterol supplements: none Past cholesterol medications: none Medication compliance: excellent compliance Aspirin: yes Recent stressors: no Recurrent headaches: no Visual changes: no Palpitations: no Dyspnea: no Chest pain: no Lower extremity edema: no Dizzy/lightheaded: no  DIABETES Hypoglycemic episodes:no Polydipsia/polyuria: no Visual disturbance: no Chest pain: no Paresthesias: no Glucose Monitoring: no  Accucheck frequency: Not Checking Taking Insulin?: no Blood Pressure Monitoring: not checking Retinal Examination: Not up to Date Foot Exam: Up to Date Diabetic Education: Completed Pneumovax: Up to Date Influenza: Up to Date Aspirin: yes  UPPER RESPIRATORY TRACT INFECTION Duration: 3 weeks Worst symptom: congestion Fever: no Cough: no Shortness of breath: no Wheezing: no Chest pain: no Chest tightness: no Chest congestion: no Nasal congestion: yes Runny nose: yes Post nasal drip: yes Sneezing: no Sore throat: no Swollen glands: no Sinus pressure: yes Headache: yes Face pain: yes Toothache: yes Ear pain: yes bilateral Ear pressure: yes bilateral Eyes red/itching:no Eye drainage/crusting: no  Vomiting: no Rash:  no Fatigue: yes Sick contacts: no Strep contacts: no  Context: worse Recurrent sinusitis: no Relief with OTC cold/cough medications: no  Treatments attempted: alkaseltzer cold and sinus  No gout flares. Tolerating medicine well.    Relevant past medical, surgical, family and social history reviewed and updated as indicated. Interim medical history since our last visit reviewed. Allergies and medications reviewed and updated.  Review of Systems  Constitutional: Negative.   HENT:  Positive for congestion, postnasal drip, rhinorrhea, sinus pressure and sinus pain. Negative for dental problem, drooling, ear discharge, ear pain, facial swelling, hearing loss, mouth sores, nosebleeds, sneezing, sore throat, tinnitus, trouble swallowing and voice change.   Eyes: Negative.   Respiratory: Negative.    Cardiovascular: Negative.   Gastrointestinal: Negative.   Psychiatric/Behavioral: Negative.     Per HPI unless specifically indicated above     Objective:    BP 115/75    Pulse 83    Temp 98.3 F (36.8 C)    Ht 6' (1.829 m)    Wt 221 lb (100.2 kg)    SpO2 98%    BMI 29.97 kg/m   Wt Readings from Last 3 Encounters:  03/28/21 221 lb (100.2 kg)  12/26/20 225 lb (102.1 kg)  10/24/20 225 lb 3.2 oz (102.2 kg)    Physical Exam Vitals and nursing note reviewed.  Constitutional:      General: He is not in acute distress.    Appearance: Normal appearance. He is normal weight. He is not ill-appearing, toxic-appearing or diaphoretic.  HENT:     Head: Normocephalic and atraumatic.     Right Ear: Tympanic membrane, ear canal and external ear  normal.     Left Ear: Tympanic membrane, ear canal and external ear normal.     Nose: Congestion and rhinorrhea present.     Mouth/Throat:     Mouth: Mucous membranes are moist.     Pharynx: Oropharynx is clear. No oropharyngeal exudate or posterior oropharyngeal erythema.  Eyes:     General: No scleral icterus.       Right eye: No discharge.         Left eye: No discharge.     Extraocular Movements: Extraocular movements intact.     Conjunctiva/sclera: Conjunctivae normal.     Pupils: Pupils are equal, round, and reactive to light.  Cardiovascular:     Rate and Rhythm: Normal rate and regular rhythm.     Pulses: Normal pulses.     Heart sounds: Normal heart sounds. No murmur heard.   No friction rub. No gallop.  Pulmonary:     Effort: Pulmonary effort is normal. No respiratory distress.     Breath sounds: Normal breath sounds. No stridor. No wheezing, rhonchi or rales.  Chest:     Chest wall: No tenderness.  Musculoskeletal:        General: Normal range of motion.     Cervical back: Normal range of motion and neck supple.  Skin:    General: Skin is warm and dry.     Capillary Refill: Capillary refill takes less than 2 seconds.     Coloration: Skin is not jaundiced or pale.     Findings: No bruising, erythema, lesion or rash.  Neurological:     General: No focal deficit present.     Mental Status: He is alert and oriented to person, place, and time. Mental status is at baseline.  Psychiatric:        Mood and Affect: Mood normal.        Behavior: Behavior normal.        Thought Content: Thought content normal.        Judgment: Judgment normal.    Results for orders placed or performed in visit on 12/26/20  Bayer DCA Hb A1c Waived  Result Value Ref Range   HB A1C (BAYER DCA - WAIVED) 6.9 (H) 4.8 - 5.6 %      Assessment & Plan:   Problem List Items Addressed This Visit       Cardiovascular and Mediastinum   Hypertension    Under good control on current regimen. Continue current regimen. Continue to monitor. Call with any concerns. Refills given. Labs drawn today.       Relevant Medications   tadalafil (CIALIS) 20 MG tablet   lisinopril (ZESTRIL) 20 MG tablet   amLODipine (NORVASC) 10 MG tablet   Other Relevant Orders   Comprehensive metabolic panel   CBC with Differential/Platelet     Endocrine   Type 2  diabetes mellitus with hyperglycemia ([place]) - Primary    Doing well with a1c of 6.5. Continue current regimen. Continue to monitor. Recheck 6 months. Call with any concerns.       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 Continue current regimen. Continue to monitor. Call with any concerns.  Refills given. Labs drawn today.       Relevant Medications   allopurinol (ZYLOPRIM) 100 MG tablet   Other Relevant Orders   Comprehensive metabolic panel   CBC with Differential/Platelet   Uric acid     Other   Hyperlipidemia    Under good control on current regimen. Continue current regimen. Continue to monitor. Call with any concerns. Refills given. Labs drawn today.       Relevant Medications   tadalafil (CIALIS) 20 MG tablet   lisinopril (ZESTRIL) 20 MG tablet   amLODipine (NORVASC) 10 MG tablet   Other Relevant Orders   Comprehensive metabolic panel   CBC with Differential/Platelet   Lipid Panel w/o Chol/HDL Ratio   Erectile dysfunction    Will start cialis. Call with any concerns.       Other Visit Diagnoses     Acute non-recurrent maxillary sinusitis       Will treat with augmentin. Call with any concerns or if not getting better.    Relevant Medications   amoxicillin-clavulanate (AUGMENTIN) 875-125 MG tablet        Follow up plan: Return in about 6 months (around 09/25/2021) for physical.

## 2021-03-29 LAB — CBC WITH DIFFERENTIAL/PLATELET
Basophils Absolute: 0.1 10*3/uL (ref 0.0–0.2)
Basos: 1 %
EOS (ABSOLUTE): 0.2 10*3/uL (ref 0.0–0.4)
Eos: 2 %
Hematocrit: 41.8 % (ref 37.5–51.0)
Hemoglobin: 14.9 g/dL (ref 13.0–17.7)
Immature Grans (Abs): 0 10*3/uL (ref 0.0–0.1)
Immature Granulocytes: 0 %
Lymphocytes Absolute: 1.4 10*3/uL (ref 0.7–3.1)
Lymphs: 17 %
MCH: 32.5 pg (ref 26.6–33.0)
MCHC: 35.6 g/dL (ref 31.5–35.7)
MCV: 91 fL (ref 79–97)
Monocytes Absolute: 0.7 10*3/uL (ref 0.1–0.9)
Monocytes: 8 %
Neutrophils Absolute: 6.1 10*3/uL (ref 1.4–7.0)
Neutrophils: 72 %
Platelets: 260 10*3/uL (ref 150–450)
RBC: 4.58 x10E6/uL (ref 4.14–5.80)
RDW: 13.1 % (ref 11.6–15.4)
WBC: 8.4 10*3/uL (ref 3.4–10.8)

## 2021-03-29 LAB — COMPREHENSIVE METABOLIC PANEL
ALT: 19 IU/L (ref 0–44)
AST: 24 IU/L (ref 0–40)
Albumin/Globulin Ratio: 1.7 (ref 1.2–2.2)
Albumin: 4.2 g/dL (ref 3.8–4.9)
Alkaline Phosphatase: 155 IU/L — ABNORMAL HIGH (ref 44–121)
BUN/Creatinine Ratio: 10 (ref 9–20)
BUN: 9 mg/dL (ref 6–24)
Bilirubin Total: 0.3 mg/dL (ref 0.0–1.2)
CO2: 23 mmol/L (ref 20–29)
Calcium: 9.6 mg/dL (ref 8.7–10.2)
Chloride: 105 mmol/L (ref 96–106)
Creatinine, Ser: 0.89 mg/dL (ref 0.76–1.27)
Globulin, Total: 2.5 g/dL (ref 1.5–4.5)
Glucose: 186 mg/dL — ABNORMAL HIGH (ref 70–99)
Potassium: 3.9 mmol/L (ref 3.5–5.2)
Sodium: 143 mmol/L (ref 134–144)
Total Protein: 6.7 g/dL (ref 6.0–8.5)
eGFR: 102 mL/min/{1.73_m2} (ref >59)

## 2021-03-29 LAB — LIPID PANEL W/O CHOL/HDL RATIO
Cholesterol, Total: 186 mg/dL (ref 100–199)
HDL: 40 mg/dL (ref >39)
LDL Chol Calc (NIH): 112 mg/dL — ABNORMAL HIGH (ref 0–99)
Triglycerides: 197 mg/dL — ABNORMAL HIGH (ref 0–149)
VLDL Cholesterol Cal: 34 mg/dL (ref 5–40)

## 2021-03-29 LAB — URIC ACID: Uric Acid: 6.1 mg/dL (ref 3.8–8.4)

## 2021-09-26 ENCOUNTER — Encounter: Payer: Self-pay | Admitting: Family Medicine

## 2021-09-26 ENCOUNTER — Ambulatory Visit (INDEPENDENT_AMBULATORY_CARE_PROVIDER_SITE_OTHER): Payer: BC Managed Care – PPO | Admitting: Family Medicine

## 2021-09-26 VITALS — BP 126/81 | HR 79 | Temp 98.2°F | Wt 222.0 lb

## 2021-09-26 DIAGNOSIS — E1165 Type 2 diabetes mellitus with hyperglycemia: Secondary | ICD-10-CM

## 2021-09-26 DIAGNOSIS — Z23 Encounter for immunization: Secondary | ICD-10-CM

## 2021-09-26 DIAGNOSIS — Z125 Encounter for screening for malignant neoplasm of prostate: Secondary | ICD-10-CM

## 2021-09-26 DIAGNOSIS — R0981 Nasal congestion: Secondary | ICD-10-CM

## 2021-09-26 DIAGNOSIS — I10 Essential (primary) hypertension: Secondary | ICD-10-CM

## 2021-09-26 DIAGNOSIS — Z0000 Encounter for general adult medical examination without abnormal findings: Secondary | ICD-10-CM

## 2021-09-26 DIAGNOSIS — E782 Mixed hyperlipidemia: Secondary | ICD-10-CM | POA: Diagnosis not present

## 2021-09-26 DIAGNOSIS — M1A0690 Idiopathic chronic gout, unspecified knee, without tophus (tophi): Secondary | ICD-10-CM

## 2021-09-26 LAB — URINALYSIS, ROUTINE W REFLEX MICROSCOPIC
Bilirubin, UA: NEGATIVE
Glucose, UA: NEGATIVE
Ketones, UA: NEGATIVE
Leukocytes,UA: NEGATIVE
Nitrite, UA: NEGATIVE
Protein,UA: NEGATIVE
RBC, UA: NEGATIVE
Specific Gravity, UA: 1.025 (ref 1.005–1.030)
Urobilinogen, Ur: 1 mg/dL (ref 0.2–1.0)
pH, UA: 6 (ref 5.0–7.5)

## 2021-09-26 LAB — MICROALBUMIN, URINE WAIVED
Creatinine, Urine Waived: 100 mg/dL (ref 10–300)
Microalb, Ur Waived: 30 mg/L — ABNORMAL HIGH (ref 0–19)
Microalb/Creat Ratio: <30 mg/g (ref <30)

## 2021-09-26 LAB — BAYER DCA HB A1C WAIVED: HB A1C (BAYER DCA - WAIVED): 6.3 % — ABNORMAL HIGH (ref 4.8–5.6)

## 2021-09-26 MED ORDER — METFORMIN HCL ER 500 MG PO TB24: 1000.0000 mg | ORAL_TABLET | Freq: Two times a day (BID) | ORAL | 1 refills | Status: DC

## 2021-09-26 MED ORDER — TADALAFIL 20 MG PO TABS: 10.0000 mg | ORAL_TABLET | ORAL | 11 refills | Status: DC | PRN

## 2021-09-26 MED ORDER — AMLODIPINE BESYLATE 10 MG PO TABS: 10.0000 mg | ORAL_TABLET | Freq: Every day | ORAL | 1 refills | Status: DC

## 2021-09-26 MED ORDER — FLUTICASONE PROPIONATE 50 MCG/ACT NA SUSP: 2.0000 | Freq: Every day | NASAL | 6 refills | Status: DC

## 2021-09-26 MED ORDER — LISINOPRIL 20 MG PO TABS: 20.0000 mg | ORAL_TABLET | Freq: Every day | ORAL | 1 refills | Status: DC

## 2021-09-26 MED ORDER — ALLOPURINOL 100 MG PO TABS
200.0000 mg | ORAL_TABLET | Freq: Every day | ORAL | 1 refills | Status: DC
Start: 2021-09-26 — End: 2022-03-21

## 2021-09-26 MED ORDER — PREDNISONE 50 MG PO TABS: 50.0000 mg | ORAL_TABLET | Freq: Every day | ORAL | 0 refills | Status: DC

## 2021-09-26 NOTE — Progress Notes (Signed)
BP 126/81   Pulse 79   Temp 98.2 F (36.8 C)   Wt 222 lb (100.7 kg)   SpO2 98%   BMI 30.11 kg/m    Subjective:    Patient ID: Chase Hull, male    DOB: [birth year], 56 y.o.   MRN: [id number]  HPI: Chase KUZEL is a 56 y.o. male  Chief Complaint  Patient presents with   Diabetes    Patient states he has not had eye exam this year    Hyperlipidemia   Hypertension   DIABETES Hypoglycemic episodes:no Polydipsia/polyuria: no Visual disturbance: no Chest pain: no Paresthesias: no Glucose Monitoring: no  Accucheck frequency:  rarely Taking Insulin?: no Blood Pressure Monitoring: not checking Retinal Examination: Not up to Date Foot Exam: Up to Date Diabetic Education: Completed Pneumovax: Up to Date Influenza: Up to Date Aspirin: yes  No gout flares. Tolerating medicine well.   HYPERTENSION / HYPERLIPIDEMIA Satisfied with current treatment? yes Duration of hypertension: chronic BP monitoring frequency: not checking BP medication side effects: no Past BP meds: lisinopril, amlodipine Duration of hyperlipidemia: chronic Cholesterol medication side effects: not on anything Cholesterol supplements: none Past cholesterol medications:  Medication compliance: excellent compliance Aspirin: yes Recent stressors: no Recurrent headaches: no Visual changes: no Palpitations: no Dyspnea: no Chest pain: no Lower extremity edema: no Dizzy/lightheaded: no  Relevant past medical, surgical, family and social history reviewed and updated as indicated. Interim medical history since our last visit reviewed. Allergies and medications reviewed and updated.  Review of Systems  Constitutional: Negative.  Negative for activity change, appetite change, chills, diaphoresis, fatigue, fever and unexpected weight change.  HENT:  Positive for congestion and sinus pressure. Negative for dental problem, drooling, ear discharge, ear pain, facial swelling, hearing loss, mouth sores,  nosebleeds, postnasal drip, rhinorrhea, sinus pain, sneezing, sore throat, tinnitus, trouble swallowing and voice change.   Respiratory: Negative.    Cardiovascular: Negative.   Gastrointestinal: Negative.   Musculoskeletal: Negative.   Neurological: Negative.   Psychiatric/Behavioral: Negative.      Per HPI unless specifically indicated above     Objective:    BP 126/81   Pulse 79   Temp 98.2 F (36.8 C)   Wt 222 lb (100.7 kg)   SpO2 98%   BMI 30.11 kg/m   Wt Readings from Last 3 Encounters:  09/26/21 222 lb (100.7 kg)  03/28/21 221 lb (100.2 kg)  12/26/20 225 lb (102.1 kg)    Physical Exam Vitals and nursing note reviewed.  Constitutional:      General: He is not in acute distress.    Appearance: Normal appearance. He is obese. He is not ill-appearing, toxic-appearing or diaphoretic.  HENT:     Head: Normocephalic and atraumatic.     Right Ear: Tympanic membrane, ear canal and external ear normal.     Left Ear: Tympanic membrane, ear canal and external ear normal.     Nose: Congestion and rhinorrhea present.     Mouth/Throat:     Mouth: Mucous membranes are moist.     Pharynx: Oropharynx is clear.  Eyes:     General: No scleral icterus.       Right eye: No discharge.        Left eye: No discharge.     Extraocular Movements: Extraocular movements intact.     Conjunctiva/sclera: Conjunctivae normal.     Pupils: Pupils are equal, round, and reactive to light.  Cardiovascular:     Rate and Rhythm: Normal rate  and regular rhythm.     Pulses: Normal pulses.     Heart sounds: Normal heart sounds. No murmur heard.    No friction rub. No gallop.  Pulmonary:     Effort: Pulmonary effort is normal. No respiratory distress.     Breath sounds: Normal breath sounds. No stridor. No wheezing, rhonchi or rales.  Chest:     Chest wall: No tenderness.  Musculoskeletal:        General: Normal range of motion.     Cervical back: Normal range of motion and neck supple.   Skin:    General: Skin is warm and dry.     Capillary Refill: Capillary refill takes less than 2 seconds.     Coloration: Skin is not jaundiced or pale.     Findings: No bruising, erythema, lesion or rash.  Neurological:     General: No focal deficit present.     Mental Status: He is alert and oriented to person, place, and time. Mental status is at baseline.  Psychiatric:        Mood and Affect: Mood normal.        Behavior: Behavior normal.        Thought Content: Thought content normal.        Judgment: Judgment normal.     Results for orders placed or performed in visit on 09/26/21  Urinalysis, Routine w reflex microscopic  Result Value Ref Range   Specific Gravity, UA 1.025 1.005 - 1.030   pH, UA 6.0 5.0 - 7.5   Color, UA Yellow Yellow   Appearance Ur Clear Clear   Leukocytes,UA Negative Negative   Protein,UA Negative Negative/Trace   Glucose, UA Negative Negative   Ketones, UA Negative Negative   RBC, UA Negative Negative   Bilirubin, UA Negative Negative   Urobilinogen, Ur 1.0 0.2 - 1.0 mg/dL   Nitrite, UA Negative Negative  Microalbumin, Urine Waived  Result Value Ref Range   Microalb, Ur Waived 30 (H) 0 - 19 mg/L   Creatinine, Urine Waived 100 10 - 300 mg/dL   Microalb/Creat Ratio <30 <30 mg/g  Bayer DCA Hb A1c Waived  Result Value Ref Range   HB A1C (BAYER DCA - WAIVED) 6.3 (H) 4.8 - 5.6 %      Assessment & Plan:   Problem List Items Addressed This Visit       Cardiovascular and Mediastinum   Hypertension    Under good control on current regimen. Continue current regimen. Continue to monitor. Call with any concerns. Refills given. Labs drawn today.       Relevant Medications   amLODipine (NORVASC) 10 MG tablet   lisinopril (ZESTRIL) 20 MG tablet   tadalafil (CIALIS) 20 MG tablet   Other Relevant Orders   Comprehensive metabolic panel   CBC with Differential/Platelet   Microalbumin, Urine Waived (Completed)   TSH     Endocrine   Type 2  diabetes mellitus with hyperglycemia ([place]) - Primary    Doing great with A1c of 6.3. Continue current regimen. Continue to monitor. Call with any concerns.       Relevant Medications   metFORMIN (GLUCOPHAGE XR) 500 MG 24 hr tablet   lisinopril (ZESTRIL) 20 MG tablet   Other Relevant Orders   Comprehensive metabolic panel   CBC with Differential/Platelet   Urinalysis, Routine w reflex microscopic (Completed)   Microalbumin, Urine Waived (Completed)   Bayer DCA Hb A1c Waived (Completed)     Musculoskeletal and Integument   Idiopathic  chronic gout of knee without tophus    Under good control on current regimen. Continue current regimen. Continue to monitor. Call with any concerns. Refills given. Labs drawn today.       Relevant Medications   allopurinol (ZYLOPRIM) 100 MG tablet   predniSONE (DELTASONE) 50 MG tablet     Other   Hyperlipidemia    Declines statin. Checking labs today. Await results. Treat as needed.        Relevant Medications   amLODipine (NORVASC) 10 MG tablet   lisinopril (ZESTRIL) 20 MG tablet   tadalafil (CIALIS) 20 MG tablet   Other Relevant Orders   Comprehensive metabolic panel   CBC with Differential/Platelet   Lipid Panel w/o Chol/HDL Ratio   Other Visit Diagnoses     Screening for prostate cancer       Labs drawn today. Await results.    Relevant Orders   PSA   Nasal congestion       Will treat with prednisone followed by flonase. Call if not getting better or getting worse. Continue to monitor.         Follow up plan: Return in about 6 months (around 03/29/2022) for physical, 3 month nurse for #2 shingles.

## 2021-09-26 NOTE — Assessment & Plan Note (Addendum)
Declines statin. Checking labs today. Await results. Treat as needed.

## 2021-09-26 NOTE — Assessment & Plan Note (Signed)
Under good control on current regimen. Continue current regimen. Continue to monitor. Call with any concerns. Refills given. Labs drawn today.   

## 2021-09-26 NOTE — Assessment & Plan Note (Signed)
Doing great with A1c of 6.3. Continue current regimen. Continue to monitor. Call with any concerns.  

## 2021-09-27 LAB — CBC WITH DIFFERENTIAL/PLATELET
Basophils Absolute: 0 10*3/uL (ref 0.0–0.2)
Basos: 1 %
EOS (ABSOLUTE): 0.2 10*3/uL (ref 0.0–0.4)
Eos: 2 %
Hematocrit: 43.6 % (ref 37.5–51.0)
Hemoglobin: 14.7 g/dL (ref 13.0–17.7)
Immature Grans (Abs): 0 10*3/uL (ref 0.0–0.1)
Immature Granulocytes: 0 %
Lymphocytes Absolute: 1.6 10*3/uL (ref 0.7–3.1)
Lymphs: 22 %
MCH: 33.1 pg — ABNORMAL HIGH (ref 26.6–33.0)
MCHC: 33.7 g/dL (ref 31.5–35.7)
MCV: 98 fL — ABNORMAL HIGH (ref 79–97)
Monocytes Absolute: 0.6 10*3/uL (ref 0.1–0.9)
Monocytes: 8 %
Neutrophils Absolute: 4.9 10*3/uL (ref 1.4–7.0)
Neutrophils: 67 %
Platelets: 231 10*3/uL (ref 150–450)
RBC: 4.44 x10E6/uL (ref 4.14–5.80)
RDW: 12.4 % (ref 11.6–15.4)
WBC: 7.3 10*3/uL (ref 3.4–10.8)

## 2021-09-27 LAB — COMPREHENSIVE METABOLIC PANEL
ALT: 15 IU/L (ref 0–44)
AST: 15 IU/L (ref 0–40)
Albumin/Globulin Ratio: 1.7 (ref 1.2–2.2)
Albumin: 4.4 g/dL (ref 3.8–4.9)
Alkaline Phosphatase: 132 IU/L — ABNORMAL HIGH (ref 44–121)
BUN/Creatinine Ratio: 15 (ref 9–20)
BUN: 13 mg/dL (ref 6–24)
Bilirubin Total: <0.2 mg/dL (ref 0.0–1.2)
CO2: 21 mmol/L (ref 20–29)
Calcium: 9.4 mg/dL (ref 8.7–10.2)
Chloride: 104 mmol/L (ref 96–106)
Creatinine, Ser: 0.86 mg/dL (ref 0.76–1.27)
Globulin, Total: 2.6 g/dL (ref 1.5–4.5)
Glucose: 170 mg/dL — ABNORMAL HIGH (ref 70–99)
Potassium: 3.3 mmol/L — ABNORMAL LOW (ref 3.5–5.2)
Sodium: 142 mmol/L (ref 134–144)
Total Protein: 7 g/dL (ref 6.0–8.5)
eGFR: 104 mL/min/{1.73_m2} (ref >59)

## 2021-09-27 LAB — LIPID PANEL W/O CHOL/HDL RATIO
Cholesterol, Total: 194 mg/dL (ref 100–199)
HDL: 41 mg/dL (ref >39)
LDL Chol Calc (NIH): 115 mg/dL — ABNORMAL HIGH (ref 0–99)
Triglycerides: 217 mg/dL — ABNORMAL HIGH (ref 0–149)
VLDL Cholesterol Cal: 38 mg/dL (ref 5–40)

## 2021-09-27 LAB — TSH: TSH: 0.871 u[IU]/mL (ref 0.450–4.500)

## 2021-09-27 LAB — PSA: Prostate Specific Ag, Serum: 1.5 ng/mL (ref 0.0–4.0)

## 2021-12-27 ENCOUNTER — Ambulatory Visit (INDEPENDENT_AMBULATORY_CARE_PROVIDER_SITE_OTHER): Payer: BC Managed Care – PPO

## 2021-12-27 DIAGNOSIS — Z23 Encounter for immunization: Secondary | ICD-10-CM

## 2021-12-27 NOTE — Progress Notes (Signed)
Patient presents today for 2nd Shingrix  vaccination, patient received in left  deltoid, patient tolerated well.

## 2022-03-21 ENCOUNTER — Encounter: Payer: Self-pay | Admitting: Family Medicine

## 2022-03-21 ENCOUNTER — Ambulatory Visit (INDEPENDENT_AMBULATORY_CARE_PROVIDER_SITE_OTHER): Payer: BC Managed Care – PPO | Admitting: Family Medicine

## 2022-03-21 VITALS — BP 119/80 | HR 77 | Temp 98.0°F | Ht 72.0 in | Wt 230.8 lb

## 2022-03-21 DIAGNOSIS — M1A0690 Idiopathic chronic gout, unspecified knee, without tophus (tophi): Secondary | ICD-10-CM | POA: Diagnosis not present

## 2022-03-21 DIAGNOSIS — E1165 Type 2 diabetes mellitus with hyperglycemia: Secondary | ICD-10-CM

## 2022-03-21 DIAGNOSIS — E782 Mixed hyperlipidemia: Secondary | ICD-10-CM | POA: Diagnosis not present

## 2022-03-21 DIAGNOSIS — I10 Essential (primary) hypertension: Secondary | ICD-10-CM

## 2022-03-21 DIAGNOSIS — J0100 Acute maxillary sinusitis, unspecified: Secondary | ICD-10-CM

## 2022-03-21 LAB — BAYER DCA HB A1C WAIVED: HB A1C (BAYER DCA - WAIVED): 7.8 % — ABNORMAL HIGH (ref 4.8–5.6)

## 2022-03-21 MED ORDER — ALLOPURINOL 100 MG PO TABS: 200.0000 mg | ORAL_TABLET | Freq: Every day | ORAL | 1 refills | Status: DC

## 2022-03-21 MED ORDER — LISINOPRIL 20 MG PO TABS: 20.0000 mg | ORAL_TABLET | Freq: Every day | ORAL | 1 refills | Status: DC

## 2022-03-21 MED ORDER — AMLODIPINE BESYLATE 10 MG PO TABS: 10.0000 mg | ORAL_TABLET | Freq: Every day | ORAL | 1 refills | Status: DC

## 2022-03-21 MED ORDER — METFORMIN HCL ER 500 MG PO TB24: 1000.0000 mg | ORAL_TABLET | Freq: Two times a day (BID) | ORAL | 1 refills | Status: DC

## 2022-03-21 MED ORDER — AMOXICILLIN-POT CLAVULANATE 875-125 MG PO TABS: 1.0000 | ORAL_TABLET | Freq: Two times a day (BID) | ORAL | 0 refills | Status: DC

## 2022-03-21 NOTE — Assessment & Plan Note (Signed)
Under good control on current regimen. Continue current regimen. Continue to monitor. Call with any concerns. Refills given. Labs drawn today.

## 2022-03-21 NOTE — Progress Notes (Signed)
BP 119/80   Pulse 77   Temp 98 F (36.7 C) (Oral)   Ht 6' (1.829 m)   Wt 230 lb 12.8 oz (104.7 kg)   SpO2 99%   BMI 31.30 kg/m    Subjective:    Patient ID: Chase Hull, male    DOB: [birth year], 57 y.o.   MRN: [id number]  HPI: Chase Hull is a 57 y.o. male  Chief Complaint  Patient presents with   Nasal Congestion    Patient says he has some nasal congestion and burning and pain sensation in his nasal canal. Patient says he has been taking over the counter and nasal spray. Patient says he has been symptomatic for the past 2-3 weeks.    Diabetes    Patient says he has Diabetic Eye Exam scheduled for Feb. 1st.   UPPER RESPIRATORY TRACT INFECTION Duration: 2-3 weeks Worst symptom: congestion Fever: no Cough: no Shortness of breath: no Wheezing: no Chest pain: no Chest tightness: no Chest congestion: no Nasal congestion: yes Runny nose: yes Post nasal drip: yes Sneezing: no Sore throat: no Swollen glands: no Sinus pressure: yes Headache: yes Face pain: yes Toothache: yes Ear pain: no  Ear pressure: no  Eyes red/itching:yes Eye drainage/crusting: no  Vomiting: no Rash: no Fatigue: yes Sick contacts: yes Strep contacts: no  Context: stable Recurrent sinusitis: no Relief with OTC cold/cough medications: no  Treatments attempted: none  HYPERTENSION / HYPERLIPIDEMIA Satisfied with current treatment? yes Duration of hypertension: chronic BP monitoring frequency: not checking BP medication side effects: no Past BP meds: lisinopril, amlodipine Duration of hyperlipidemia: chronic Cholesterol medication side effects: no Cholesterol supplements: none Past cholesterol medications: none Medication compliance: excellent compliance Aspirin: yes Recent stressors: no Recurrent headaches: no Visual changes: no Palpitations: no Dyspnea: no Chest pain: no Lower extremity edema: no Dizzy/lightheaded: no  DIABETES Hypoglycemic  episodes:no Polydipsia/polyuria: no Visual disturbance: no Chest pain: no Paresthesias: no Glucose Monitoring: yes  Accucheck frequency: Daily Taking Insulin?: no Blood Pressure Monitoring: not checking Retinal Examination: scheduled for 03/29/22 Foot Exam: Up to Date Diabetic Education: Completed Pneumovax: Up to Date Influenza: Up to Date Aspirin: yes     Relevant past medical, surgical, family and social history reviewed and updated as indicated. Interim medical history since our last visit reviewed. Allergies and medications reviewed and updated.  Review of Systems  Constitutional: Negative.   HENT:  Positive for congestion, postnasal drip, rhinorrhea, sinus pressure and sinus pain. Negative for dental problem, drooling, ear discharge, ear pain, facial swelling, hearing loss, mouth sores, nosebleeds, sneezing, sore throat, tinnitus, trouble swallowing and voice change.   Respiratory: Negative.    Cardiovascular: Negative.   Gastrointestinal: Negative.   Musculoskeletal: Negative.   Psychiatric/Behavioral: Negative.      Per HPI unless specifically indicated above     Objective:    BP 119/80   Pulse 77   Temp 98 F (36.7 C) (Oral)   Ht 6' (1.829 m)   Wt 230 lb 12.8 oz (104.7 kg)   SpO2 99%   BMI 31.30 kg/m   Wt Readings from Last 3 Encounters:  03/21/22 230 lb 12.8 oz (104.7 kg)  09/26/21 222 lb (100.7 kg)  03/28/21 221 lb (100.2 kg)    Physical Exam Vitals and nursing note reviewed.  Constitutional:      General: He is not in acute distress.    Appearance: Normal appearance. He is not ill-appearing, toxic-appearing or diaphoretic.  HENT:     Head: Normocephalic and  atraumatic.     Right Ear: Tympanic membrane, ear canal and external ear normal.     Left Ear: Tympanic membrane, ear canal and external ear normal.     Nose: Congestion present. No rhinorrhea.     Mouth/Throat:     Mouth: Mucous membranes are moist.     Pharynx: Oropharynx is clear. No  oropharyngeal exudate or posterior oropharyngeal erythema.  Eyes:     General: No scleral icterus.       Right eye: No discharge.        Left eye: No discharge.     Extraocular Movements: Extraocular movements intact.     Conjunctiva/sclera: Conjunctivae normal.     Pupils: Pupils are equal, round, and reactive to light.  Cardiovascular:     Rate and Rhythm: Normal rate and regular rhythm.     Pulses: Normal pulses.     Heart sounds: Normal heart sounds. No murmur heard.    No friction rub. No gallop.  Pulmonary:     Effort: Pulmonary effort is normal. No respiratory distress.     Breath sounds: Normal breath sounds. No stridor. No wheezing, rhonchi or rales.  Chest:     Chest wall: No tenderness.  Musculoskeletal:        General: Normal range of motion.     Cervical back: Normal range of motion and neck supple.  Skin:    General: Skin is warm and dry.     Capillary Refill: Capillary refill takes less than 2 seconds.     Coloration: Skin is not jaundiced or pale.     Findings: No bruising, erythema, lesion or rash.  Neurological:     General: No focal deficit present.     Mental Status: He is alert and oriented to person, place, and time. Mental status is at baseline.  Psychiatric:        Mood and Affect: Mood normal.        Behavior: Behavior normal.        Thought Content: Thought content normal.        Judgment: Judgment normal.     Results for orders placed or performed in visit on 09/26/21  Comprehensive metabolic panel  Result Value Ref Range   Glucose 170 (H) 70 - 99 mg/dL   BUN 13 6 - 24 mg/dL   Creatinine, Ser 0.86 0.76 - 1.27 mg/dL   eGFR 104 >59 mL/min/1.73   BUN/Creatinine Ratio 15 9 - 20   Sodium 142 134 - 144 mmol/L   Potassium 3.3 (L) 3.5 - 5.2 mmol/L   Chloride 104 96 - 106 mmol/L   CO2 21 20 - 29 mmol/L   Calcium 9.4 8.7 - 10.2 mg/dL   Total Protein 7.0 6.0 - 8.5 g/dL   Albumin 4.4 3.8 - 4.9 g/dL   Globulin, Total 2.6 1.5 - 4.5 g/dL    Albumin/Globulin Ratio 1.7 1.2 - 2.2   Bilirubin Total <0.2 0.0 - 1.2 mg/dL   Alkaline Phosphatase 132 (H) 44 - 121 IU/L   AST 15 0 - 40 IU/L   ALT 15 0 - 44 IU/L  CBC with Differential/Platelet  Result Value Ref Range   WBC 7.3 3.4 - 10.8 x10E3/uL   RBC 4.44 4.14 - 5.80 x10E6/uL   Hemoglobin 14.7 13.0 - 17.7 g/dL   Hematocrit 43.6 37.5 - 51.0 %   MCV 98 (H) 79 - 97 fL   MCH 33.1 (H) 26.6 - 33.0 pg   MCHC 33.7 31.5 - 35.7  g/dL   RDW 12.4 11.6 - 15.4 %   Platelets 231 150 - 450 x10E3/uL   Neutrophils 67 Not Estab. %   Lymphs 22 Not Estab. %   Monocytes 8 Not Estab. %   Eos 2 Not Estab. %   Basos 1 Not Estab. %   Neutrophils Absolute 4.9 1.4 - 7.0 x10E3/uL   Lymphocytes Absolute 1.6 0.7 - 3.1 x10E3/uL   Monocytes Absolute 0.6 0.1 - 0.9 x10E3/uL   EOS (ABSOLUTE) 0.2 0.0 - 0.4 x10E3/uL   Basophils Absolute 0.0 0.0 - 0.2 x10E3/uL   Immature Granulocytes 0 Not Estab. %   Immature Grans (Abs) 0.0 0.0 - 0.1 x10E3/uL  Lipid Panel w/o Chol/HDL Ratio  Result Value Ref Range   Cholesterol, Total 194 100 - 199 mg/dL   Triglycerides 217 (H) 0 - 149 mg/dL   HDL 41 >39 mg/dL   VLDL Cholesterol Cal 38 5 - 40 mg/dL   LDL Chol Calc (NIH) 115 (H) 0 - 99 mg/dL  Urinalysis, Routine w reflex microscopic  Result Value Ref Range   Specific Gravity, UA 1.025 1.005 - 1.030   pH, UA 6.0 5.0 - 7.5   Color, UA Yellow Yellow   Appearance Ur Clear Clear   Leukocytes,UA Negative Negative   Protein,UA Negative Negative/Trace   Glucose, UA Negative Negative   Ketones, UA Negative Negative   RBC, UA Negative Negative   Bilirubin, UA Negative Negative   Urobilinogen, Ur 1.0 0.2 - 1.0 mg/dL   Nitrite, UA Negative Negative  Microalbumin, Urine Waived  Result Value Ref Range   Microalb, Ur Waived 30 (H) 0 - 19 mg/L   Creatinine, Urine Waived 100 10 - 300 mg/dL   Microalb/Creat Ratio <30 <30 mg/g  Bayer DCA Hb A1c Waived  Result Value Ref Range   HB A1C (BAYER DCA - WAIVED) 6.3 (H) 4.8 - 5.6 %  PSA   Result Value Ref Range   Prostate Specific Ag, Serum 1.5 0.0 - 4.0 ng/mL  TSH  Result Value Ref Range   TSH 0.871 0.450 - 4.500 uIU/mL      Assessment & Plan:   Problem List Items Addressed This Visit       Cardiovascular and Mediastinum   Hypertension    Under good control on current regimen. Continue current regimen. Continue to monitor. Call with any concerns. Refills given. Labs drawn today.       Relevant Medications   amLODipine (NORVASC) 10 MG tablet   lisinopril (ZESTRIL) 20 MG tablet     Endocrine   Type 2 diabetes mellitus with hyperglycemia ([place])    Not under good control with A1c up to 7.8 from 6.3- will really work on his diet and recheck in 3 months. Call with any concerns. Continue to monitor.       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 Continue current regimen. Continue to monitor. Call with any concerns. Refills given. Labs drawn today.      Relevant Medications   allopurinol (ZYLOPRIM) 100 MG tablet   Other Relevant Orders   Uric acid     Other  Hyperlipidemia    Declines a statin. Continue to monitor. Rechecking labs today.      Relevant Medications   amLODipine (NORVASC) 10 MG tablet   lisinopril (ZESTRIL) 20 MG tablet   Other Visit Diagnoses     Acute non-recurrent maxillary sinusitis    -  Primary   Will treat with augmentin. Call with any concerns or if not getting better. Continue to monitor.   Relevant Medications   amoxicillin-clavulanate (AUGMENTIN) 875-125 MG tablet        Follow up plan: Return in about 3 months (around 06/20/2022).

## 2022-03-21 NOTE — Assessment & Plan Note (Signed)
Not under good control with A1c up to 7.8 from 6.3- will really work on his diet and recheck in 3 months. Call with any concerns. Continue to monitor.

## 2022-03-21 NOTE — Assessment & Plan Note (Signed)
Declines a statin. Continue to monitor. Rechecking labs today.

## 2022-03-22 LAB — CBC WITH DIFFERENTIAL/PLATELET
Basophils Absolute: 0 10*3/uL (ref 0.0–0.2)
Basos: 0 %
EOS (ABSOLUTE): 0.2 10*3/uL (ref 0.0–0.4)
Eos: 2 %
Hematocrit: 42 % (ref 37.5–51.0)
Hemoglobin: 14.8 g/dL (ref 13.0–17.7)
Immature Grans (Abs): 0 10*3/uL (ref 0.0–0.1)
Immature Granulocytes: 0 %
Lymphocytes Absolute: 1.9 10*3/uL (ref 0.7–3.1)
Lymphs: 21 %
MCH: 32.8 pg (ref 26.6–33.0)
MCHC: 35.2 g/dL (ref 31.5–35.7)
MCV: 93 fL (ref 79–97)
Monocytes Absolute: 0.7 10*3/uL (ref 0.1–0.9)
Monocytes: 7 %
Neutrophils Absolute: 6.3 10*3/uL (ref 1.4–7.0)
Neutrophils: 70 %
Platelets: 245 10*3/uL (ref 150–450)
RBC: 4.51 x10E6/uL (ref 4.14–5.80)
RDW: 11.5 % — ABNORMAL LOW (ref 11.6–15.4)
WBC: 9.1 10*3/uL (ref 3.4–10.8)

## 2022-03-22 LAB — LIPID PANEL W/O CHOL/HDL RATIO
Cholesterol, Total: 196 mg/dL (ref 100–199)
HDL: 40 mg/dL (ref >39)
LDL Chol Calc (NIH): 142 mg/dL — ABNORMAL HIGH (ref 0–99)
Triglycerides: 76 mg/dL (ref 0–149)
VLDL Cholesterol Cal: 14 mg/dL (ref 5–40)

## 2022-03-22 LAB — COMPREHENSIVE METABOLIC PANEL
ALT: 23 IU/L (ref 0–44)
AST: 22 IU/L (ref 0–40)
Albumin/Globulin Ratio: 1.8 (ref 1.2–2.2)
Albumin: 4.4 g/dL (ref 3.8–4.9)
Alkaline Phosphatase: 139 IU/L — ABNORMAL HIGH (ref 44–121)
BUN/Creatinine Ratio: 13 (ref 9–20)
BUN: 11 mg/dL (ref 6–24)
Bilirubin Total: 0.4 mg/dL (ref 0.0–1.2)
CO2: 22 mmol/L (ref 20–29)
Calcium: 9.6 mg/dL (ref 8.7–10.2)
Chloride: 101 mmol/L (ref 96–106)
Creatinine, Ser: 0.85 mg/dL (ref 0.76–1.27)
Globulin, Total: 2.4 g/dL (ref 1.5–4.5)
Glucose: 209 mg/dL — ABNORMAL HIGH (ref 70–99)
Potassium: 3.4 mmol/L — ABNORMAL LOW (ref 3.5–5.2)
Sodium: 139 mmol/L (ref 134–144)
Total Protein: 6.8 g/dL (ref 6.0–8.5)
eGFR: 103 mL/min/{1.73_m2} (ref >59)

## 2022-03-22 LAB — URIC ACID: Uric Acid: 5.5 mg/dL (ref 3.8–8.4)

## 2022-03-30 ENCOUNTER — Encounter: Payer: BC Managed Care – PPO | Admitting: Family Medicine

## 2022-04-11 LAB — HM DIABETES EYE EXAM

## 2022-04-13 ENCOUNTER — Ambulatory Visit: Payer: Self-pay

## 2022-04-13 NOTE — Telephone Encounter (Signed)
Summary: High Blood Pressure.   Patient has been experiencing high blood pressure for the last few days. Blood pressure yesterday was 145/100 and today it's reading 135/93. Patient wants to know what his next steps are.      Chief Complaint: High BP readings. Symptoms: Was a bit dizzy yesterday has resolved.  Frequency: Ongoing Pertinent Negatives: Patient denies  Disposition: []$ ED /[x]$ Urgent Care (no appt availability in office) / []$ Appointment(In office/virtual)/ []$   Virtual Care/ []$ Home Care/ []$ Refused Recommended Disposition /[]$  Mobile Bus/ []$  Follow-up with PCP Additional Notes: Pt has had some high BP readings over the past few days. Pt also reports some HA, and some dizziness. Pt will go to UC this weekend. Pt would like to squeezed in for Monday.   Pt will monitor BP and keep a log. He will seek immediate care if Bp goes above 180 or he has neurological issues. Please advise.  Reason for Disposition  Systolic BP  >= 0000000 OR Diastolic >= 123XX123  Answer Assessment - Initial Assessment Questions 1. BLOOD PRESSURE: "What is the blood pressure?" "Did you take at least two measurements 5 minutes apart?"     Yesterday 150/101, 145/100. Today 135/93, 138/89 2. ONSET: "When did you take your blood pressure?"     Yesterday 3. HOW: "How did you take your blood pressure?" (e.g., automatic home BP monitor, visiting nurse)     automatic 4. HISTORY: "Do you have a history of high blood pressure?"     yes 5. MEDICINES: "Are you taking any medicines for blood pressure?" "Have you missed any doses recently?"     Lisinopril and amlodipine 6. OTHER SYMPTOMS: "Do you have any symptoms?" (e.g., blurred vision, chest pain, difficulty breathing, headache, weakness)     Dizzy yesterday, fells a little disconnected 7. PREGNANCY: "Is there any chance you are pregnant?" "When was your last menstrual period?"  Protocols used: Blood Pressure - High-A-AH

## 2022-04-16 ENCOUNTER — Ambulatory Visit: Payer: Self-pay | Admitting: *Deleted

## 2022-04-16 NOTE — Telephone Encounter (Signed)
  Chief Complaint: Hypertension Symptoms: Pt called Friday and triaged, note from practice to secure appt. Pt states BP continues to trend up 146/95-154/100. Headache, mild dizziness Frequency: Friday Pertinent Negatives: Patient denies . No missed med doses, no weakness Disposition: []$ ED /[]$ Urgent Care (no appt availability in office) / [x]$ Appointment(In office/virtual)/ []$  [place] Virtual Care/ []$ Home Care/ []$ Refused Recommended Disposition /[]$ [facility] Mobile Bus/ []$  Follow-up with PCP Additional Notes: Secured first available for Thursday 04/19/22. 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 Attempted to reach practice for possible work in, extended ring.  Please advise if pt can be seen earlier. Reason for Disposition  Systolic BP  >= 0000000 OR Diastolic >= 123XX123  Answer Assessment - Initial Assessment Questions 1. BLOOD PRESSURE: "What is the blood pressure?" "Did you take at least two measurements 5 minutes apart?"     Since Saturday 154/100  150/96  146/95 2. ONSET: "When did you take your blood pressure?"     Since Sat 3. HOW: "How did you take your blood pressure?" (e.g., automatic home BP monitor, visiting nurse)     Home monitor 4. HISTORY: "Do you have a history of high blood pressure?"     Yes 5. MEDICINES: "Are you taking any medicines for blood pressure?" "Have you missed any doses recently?"     Yes, no missed doses 6. OTHER SYMPTOMS: "Do you have any symptoms?" (e.g., blurred vision, chest pain, difficulty breathing, headache, weakness)     Headache, lightheaded.  Protocols used: Blood Pressure - High-A-AH

## 2022-04-16 NOTE — Telephone Encounter (Signed)
Please call and schedule the patient an appointment.

## 2022-04-16 NOTE — Telephone Encounter (Signed)
FYI for upcoming appointment. Dr. Wynetta Emery is PCP.

## 2022-04-17 ENCOUNTER — Telehealth: Payer: Self-pay

## 2022-04-17 NOTE — Telephone Encounter (Signed)
Appt scheduled with Jon Billings for 04/19/22 @ 2pm.

## 2022-04-17 NOTE — Telephone Encounter (Signed)
Error

## 2022-04-17 NOTE — Telephone Encounter (Signed)
Pt stated he feel better today / his BP was 136/84 the last time he checked it and no longer has dizziness / pt would like a letter stating he can return to work today / he plans to go in today at 3pm / I was advised by [facility] that the pt should stay home today until Dr. Wynetta Emery returns tomorrow since the other providers are not familiar with his health history / pt was advised and stated he would like an update tomorrow and he will inform his employer about not returning today

## 2022-04-17 NOTE — Telephone Encounter (Unsigned)
Copied from [id number] [id number]. Topic: General - Other >> Apr 17, 2022  9:54 AM Cyndi Bender wrote: Reason for CRM: Pt stated his blood pressure has gone down and he would like to get confirmation to provide to his employer that it is ok for him to work. Pt stated he would like to go in to work today and requests call back. Cb# [phone]

## 2022-04-18 NOTE — Telephone Encounter (Signed)
I did not put him out of work, so I cannot put him back into work. He will need to be seen as scheduled tomorrow to discuss further.

## 2022-04-18 NOTE — Telephone Encounter (Signed)
Spoke with patient to make him aware of Dr.Johnson's recommendations. Patient says it was a misunderstanding and he has not been out of work for a long period of time. Patient says he was out of work last week and called to get an appointment scheduled due to having to have a work excuse note to cover the day he was out. Patient says when he called Friday no one made him aware that Dr.Johnson was out of the office. I apologized to the patient for the miscommunication, but I also made him aware that providers have up to 72 hrs to respond. Patient is scheduled to see Jon Billings, NP tomorrow to discuss further. Patient verbalized understanding.

## 2022-04-19 ENCOUNTER — Ambulatory Visit (INDEPENDENT_AMBULATORY_CARE_PROVIDER_SITE_OTHER): Payer: BC Managed Care – PPO | Admitting: Nurse Practitioner

## 2022-04-19 ENCOUNTER — Encounter: Payer: Self-pay | Admitting: Nurse Practitioner

## 2022-04-19 VITALS — BP 121/76 | HR 90 | Temp 98.1°F | Wt 227.9 lb

## 2022-04-19 DIAGNOSIS — I10 Essential (primary) hypertension: Secondary | ICD-10-CM

## 2022-04-19 NOTE — Assessment & Plan Note (Signed)
Chronic.  Improved.  Suspect blood pressure elevation was due to patient not feeling well.  Blood pressure is back to normal.  Continue with current medication regimen.  Follow up as discussed with PCP.  Letter written for patient to return to work today.

## 2022-04-19 NOTE — Progress Notes (Signed)
BP 121/76   Pulse 90   Temp 98.1 F (36.7 C) (Oral)   Wt 227 lb 14.4 oz (103.4 kg)   SpO2 98%   BMI 30.91 kg/m    Subjective:    Patient ID: Chase Hull, male    DOB: January 24, 1966, 57 y.o.   MRN: [id number]  HPI: Chase Hull is a 57 y.o. male  Chief Complaint  Patient presents with   Hypertension    Pt states he was having headache and dizziness with elevated BP readings last week and into this week. States he is no longer having dizziness, but is still having some headaches. States he needs a letter to be able to return to work.    HYPERTENSION without Chronic Kidney Disease Paitent states he was sent home from work on Thursday.  Having dizziness and headache.  He checked his blood pressure and it was high.  He was sent back home and blood pressure was 155/106.  He just didn't feel well and wanted to lay down.  Then on Friday he was told to keep a log of his blood pressure and it was running 150/100 through Sunday.    Then on Tuesday his symptoms resolved and he was feeling better.   Hypertension status: controlled  Satisfied with current treatment? yes Duration of hypertension: years BP monitoring frequency:  daily BP range:  BP medication side effects:  yes Medication compliance: excellent compliance Previous BP meds:amlodipine and lisinopril Aspirin: no Recurrent headaches: yes Visual changes: no Palpitations: no Dyspnea: no Chest pain: no Lower extremity edema: no Dizzy/lightheaded: yes   Relevant past medical, surgical, family and social history reviewed and updated as indicated. Interim medical history since our last visit reviewed. Allergies and medications reviewed and updated.  Review of Systems  Eyes:  Negative for visual disturbance.  Respiratory:  Negative for shortness of breath.   Cardiovascular:  Negative for chest pain and leg swelling.  Neurological:  Positive for light-headedness and headaches.    Per HPI unless specifically indicated  above     Objective:    BP 121/76   Pulse 90   Temp 98.1 F (36.7 C) (Oral)   Wt 227 lb 14.4 oz (103.4 kg)   SpO2 98%   BMI 30.91 kg/m   Wt Readings from Last 3 Encounters:  04/19/22 227 lb 14.4 oz (103.4 kg)  03/21/22 230 lb 12.8 oz (104.7 kg)  09/26/21 222 lb (100.7 kg)    Physical Exam Vitals and nursing note reviewed.  Constitutional:      General: He is not in acute distress.    Appearance: Normal appearance. He is not ill-appearing, toxic-appearing or diaphoretic.  HENT:     Head: Normocephalic.     Right Ear: External ear normal.     Left Ear: External ear normal.     Nose: Nose normal. No congestion or rhinorrhea.     Mouth/Throat:     Mouth: Mucous membranes are moist.  Eyes:     General:        Right eye: No discharge.        Left eye: No discharge.     Extraocular Movements: Extraocular movements intact.     Conjunctiva/sclera: Conjunctivae normal.     Pupils: Pupils are equal, round, and reactive to light.  Cardiovascular:     Rate and Rhythm: Normal rate and regular rhythm.     Heart sounds: No murmur heard. Pulmonary:     Effort: Pulmonary effort is normal. No  respiratory distress.     Breath sounds: Normal breath sounds. No wheezing, rhonchi or rales.  Abdominal:     General: Abdomen is flat. Bowel sounds are normal.  Musculoskeletal:     Cervical back: Normal range of motion and neck supple.  Skin:    General: Skin is warm and dry.     Capillary Refill: Capillary refill takes less than 2 seconds.  Neurological:     General: No focal deficit present.     Mental Status: He is alert and oriented to person, place, and time.  Psychiatric:        Mood and Affect: Mood normal.        Behavior: Behavior normal.        Thought Content: Thought content normal.        Judgment: Judgment normal.     Results for orders placed or performed in visit on 04/12/22  HM DIABETES EYE EXAM  Result Value Ref Range   HM Diabetic Eye Exam No Retinopathy No  Retinopathy      Assessment & Plan:   Problem List Items Addressed This Visit       Cardiovascular and Mediastinum   Hypertension - Primary    Chronic.  Improved.  Suspect blood pressure elevation was due to patient not feeling well.  Blood pressure is back to normal.  Continue with current medication regimen.  Follow up as discussed with PCP.  Letter written for patient to return to work today.         Follow up plan: Return if symptoms worsen or fail to improve.

## 2022-04-19 NOTE — Patient Instructions (Signed)
Cert[place]

## 2022-04-21 ENCOUNTER — Other Ambulatory Visit: Payer: Self-pay | Admitting: Family Medicine

## 2022-04-23 NOTE — Telephone Encounter (Signed)
Requested Prescriptions  Pending Prescriptions Disp Refills   tadalafil (CIALIS) 20 MG tablet [Pharmacy Med Name: TADALAFIL 20 MG TABLET] 6 tablet 28    Sig: TAKE 1/2 TO 1 TABLET (10-20 MG TOTAL) BY MOUTH EVERY OTHER DAY AS NEEDED FOR ERECTILE DYSFUNCTION     Urology: Erectile Dysfunction Agents Passed - 04/21/2022  1:13 AM      Passed - AST in normal range and within 360 days    AST  Date Value Ref Range Status  03/21/2022 22 0 - 40 IU/L Final         Passed - ALT in normal range and within 360 days    ALT  Date Value Ref Range Status  03/21/2022 23 0 - 44 IU/L Final         Passed - Last BP in normal range    BP Readings from Last 1 Encounters:  04/19/22 121/76         Passed - Valid encounter within last 12 months    Recent Outpatient Visits           4 days ago Primary hypertension   [place], Santiago Glad, NP   1 month ago Acute non-recurrent maxillary sinusitis   [place], [place], DO   6 months ago Type 2 diabetes mellitus with hyperglycemia, without long-term current use of insulin ([place])   [place], [place] P, DO   1 year ago Type 2 diabetes mellitus with hyperglycemia, without long-term current use of insulin ([place])   [place], [place] P, DO   1 year ago Type 2 diabetes mellitus with hyperglycemia, without long-term current use of insulin ([place])   [place], Barb Merino, DO       Future Appointments             In 1 week Wynetta Emery, Barb Merino, DO [place], [place]   In 1 month [place], Barb Merino, DO [place], [facility]

## 2022-04-25 ENCOUNTER — Encounter: Payer: BC Managed Care – PPO | Admitting: Family Medicine

## 2022-05-01 ENCOUNTER — Ambulatory Visit (INDEPENDENT_AMBULATORY_CARE_PROVIDER_SITE_OTHER): Payer: BC Managed Care – PPO | Admitting: Family Medicine

## 2022-05-01 ENCOUNTER — Encounter: Payer: Self-pay | Admitting: Family Medicine

## 2022-05-01 VITALS — BP 122/81 | HR 85 | Temp 97.6°F | Ht 72.0 in | Wt 227.1 lb

## 2022-05-01 DIAGNOSIS — Z1211 Encounter for screening for malignant neoplasm of colon: Secondary | ICD-10-CM | POA: Diagnosis not present

## 2022-05-01 DIAGNOSIS — E782 Mixed hyperlipidemia: Secondary | ICD-10-CM | POA: Diagnosis not present

## 2022-05-01 DIAGNOSIS — I10 Essential (primary) hypertension: Secondary | ICD-10-CM

## 2022-05-01 DIAGNOSIS — Z0000 Encounter for general adult medical examination without abnormal findings: Secondary | ICD-10-CM | POA: Diagnosis not present

## 2022-05-01 DIAGNOSIS — E1165 Type 2 diabetes mellitus with hyperglycemia: Secondary | ICD-10-CM

## 2022-05-01 LAB — MICROALBUMIN, URINE WAIVED
Creatinine, Urine Waived: 50 mg/dL (ref 10–300)
Microalb, Ur Waived: 30 mg/L — ABNORMAL HIGH (ref 0–19)

## 2022-05-01 LAB — URINALYSIS, ROUTINE W REFLEX MICROSCOPIC
Bilirubin, UA: NEGATIVE
Leukocytes,UA: NEGATIVE
Nitrite, UA: NEGATIVE
Protein,UA: NEGATIVE
RBC, UA: NEGATIVE
Specific Gravity, UA: <1.005 — ABNORMAL LOW (ref 1.005–1.030)
Urobilinogen, Ur: 0.2 mg/dL (ref 0.2–1.0)
pH, UA: 5.5 (ref 5.0–7.5)

## 2022-05-01 LAB — BAYER DCA HB A1C WAIVED: HB A1C (BAYER DCA - WAIVED): 11.4 % — ABNORMAL HIGH (ref 4.8–5.6)

## 2022-05-01 MED ORDER — RYBELSUS 3 MG PO TABS: 3.0000 mg | ORAL_TABLET | Freq: Every day | ORAL | 0 refills | Status: DC

## 2022-05-01 NOTE — Progress Notes (Signed)
BP 122/81   Pulse 85   Temp 97.6 F (36.4 C) (Oral)   Ht 6' (1.829 m)   Wt 227 lb 1.6 oz (103 kg)   SpO2 97%   BMI 30.80 kg/m    Subjective:    Patient ID: Chase Hull, male    DOB: 25-Nov-1965, 57 y.o.   MRN: [id number]  HPI: Chase Hull is a 57 y.o. male presenting on 05/01/2022 for comprehensive medical examination. Current medical complaints include:  DIABETES Hypoglycemic episodes:no Polydipsia/polyuria: yes Visual disturbance: no Chest pain: no Paresthesias: no Glucose Monitoring: no Taking Insulin?: no Blood Pressure Monitoring: rarely Retinal Examination: Up to Date Foot Exam: Up to Date Diabetic Education: Completed Pneumovax: Up to Date Influenza: Up to Date Aspirin: yes  HYPERTENSION / HYPERLIPIDEMIA Satisfied with current treatment? yes Duration of hypertension: chronic BP monitoring frequency: not checking BP medication side effects: no Past BP meds: amlodipine, lisinopril Duration of hyperlipidemia: chronic Cholesterol medication side effects: no Cholesterol supplements: none Past cholesterol medications: none Medication compliance: excellent compliance Aspirin: yes Recent stressors: no Recurrent headaches: no Visual changes: no Palpitations: no Dyspnea: no Chest pain: no Lower extremity edema: no Dizzy/lightheaded: no  Interim Problems from his last visit: no  Depression Screen done today and results listed below:     05/01/2022   10:25 AM 04/19/2022    2:15 PM 03/21/2022    8:15 AM 09/26/2021   10:10 AM 03/28/2021   10:13 AM  Depression screen PHQ 2/9  Decreased Interest 0 0 0 0 0  Down, Depressed, Hopeless 0 0 0 0 0  PHQ - 2 Score 0 0 0 0 0  Altered sleeping 0 1 1 0 0  Tired, decreased energy 0 0 0 0 0  Change in appetite 0 1 0 0 0  Feeling bad or failure about yourself  0 0 0 0 0  Trouble concentrating 0 0 0 0 0  Moving slowly or fidgety/restless 0 0 0 0 0  Suicidal thoughts 0 0 0 0 0  PHQ-9 Score 0 2 1 0 0  Difficult doing  work/chores Not difficult at all Not difficult at all Not difficult at all Not difficult at all     Past Medical History:  Past Medical History:  Diagnosis Date   Carpal tunnel syndrome    Diabetes mellitus without complication ([place])    no meds, A1C down   Gout    Hypertension     Surgical History:  Past Surgical History:  Procedure Laterality Date   COLONOSCOPY WITH PROPOFOL N/A 03/30/2019   Procedure: COLONOSCOPY WITH BIOPSY;  Surgeon: Jonathon Bellows, MD;  Location: [facility];  Service: Endoscopy;  Laterality: N/A;  Priority 4   FRACTURE SURGERY     left ankle    Left hand surgery  03/2018   Carpal tunnel   LUMBAR LAMINECTOMY/DECOMPRESSION MICRODISCECTOMY N/A 08/03/2020   Procedure: L3-4 AND L5-S1 DECOMPRESSION, RIGHT L4-5 MICRODISCECTOMY;  Surgeon: Meade Maw, MD;  Location: [facility];  Service: Neurosurgery;  Laterality: N/A;   POLYPECTOMY N/A 03/30/2019   Procedure: POLYPECTOMY;  Surgeon: Jonathon Bellows, MD;  Location: [facility];  Service: Endoscopy;  Laterality: N/A;   TONSILLECTOMY     adniods   TOOTH EXTRACTION      Medications:  Current Outpatient Medications on File Prior to Visit  Medication Sig   allopurinol (ZYLOPRIM) 100 MG tablet Take 2 tablets (200 mg total) by mouth daily.   amLODipine (NORVASC) 10 MG tablet Take 1 tablet (10  mg total) by mouth daily.   aspirin 81 MG tablet Take 81 mg by mouth daily.   Aspirin-Salicylamide-Caffeine (BC HEADACHE PO) Take 1 packet by mouth daily as needed (headaches).   Blood Glucose Monitoring Suppl (FIFTY50 GLUCOSE METER 2.0) w/Device KIT Use to check fasting blood sugars daily   Blood Glucose Monitoring Suppl (ONE TOUCH ULTRA 2) w/Device KIT 1 each by Does not apply route daily.   fluticasone (FLONASE) 50 MCG/ACT nasal spray Place 2 sprays into both nostrils daily.   glucose blood (ONETOUCH ULTRA) test strip 1 each by Other route daily.   lisinopril (ZESTRIL) 20 MG tablet Take 1 tablet (20 mg total) by  mouth daily.   metFORMIN (GLUCOPHAGE XR) 500 MG 24 hr tablet Take 2 tablets (1,000 mg total) by mouth 2 (two) times daily.   Multiple Vitamin (MULTIVITAMIN) tablet Take 1 tablet by mouth daily.   OneTouch Delica Lancets 99991111 MISC 1 each by Does not apply route daily.   tadalafil (CIALIS) 20 MG tablet TAKE 1/2 TO 1 TABLET (10-20 MG TOTAL) BY MOUTH EVERY OTHER DAY AS NEEDED FOR ERECTILE DYSFUNCTION   No current facility-administered medications on file prior to visit.    Allergies:  Allergies  Allergen Reactions   Gabapentin Swelling    Social History:  Social History   Socioeconomic History   Marital status: Single    Spouse name: Not on file   Number of children: Not on file   Years of education: Not on file   Highest education level: Not on file  Occupational History   Not on file  Tobacco Use   Smoking status: Every Day    Packs/day: 0.50    Years: 25.00    Total pack years: 12.50    Types: Cigarettes   Smokeless tobacco: Never  Vaping Use   Vaping Use: Never used  Substance and Sexual Activity   Alcohol use: Not Currently   Drug use: Never   Sexual activity: Not Currently  Other Topics Concern   Not on file  Social History Narrative   Lives with Mom   Social Determinants of Health   Financial Resource Strain: Not on file  Food Insecurity: Not on file  Transportation Needs: Not on file  Physical Activity: Not on file  Stress: Not on file  Social Connections: Not on file  Intimate Partner Violence: Not on file   Social History   Tobacco Use  Smoking Status Every Day   Packs/day: 0.50   Years: 25.00   Total pack years: 12.50   Types: Cigarettes  Smokeless Tobacco Never   Social History   Substance and Sexual Activity  Alcohol Use Not Currently    Family History:  History reviewed. No pertinent family history.  Past medical history, surgical history, medications, allergies, family history and social history reviewed with patient today and changes  made to appropriate areas of the chart.   Review of Systems  Constitutional: Negative.   HENT: Negative.         Skin tag on the R side of his neck  Eyes: Negative.   Respiratory: Negative.    Cardiovascular: Negative.   Gastrointestinal: Negative.   Genitourinary: Negative.   Musculoskeletal: Negative.   Skin: Negative.   Neurological: Negative.   Endo/Heme/Allergies:  Positive for environmental allergies and polydipsia. Does not bruise/bleed easily.  Psychiatric/Behavioral: Negative.     All other ROS negative except what is listed above and in the HPI.      Objective:  BP 122/81   Pulse 85   Temp 97.6 F (36.4 C) (Oral)   Ht 6' (1.829 m)   Wt 227 lb 1.6 oz (103 kg)   SpO2 97%   BMI 30.80 kg/m   Wt Readings from Last 3 Encounters:  05/01/22 227 lb 1.6 oz (103 kg)  04/19/22 227 lb 14.4 oz (103.4 kg)  03/21/22 230 lb 12.8 oz (104.7 kg)    Physical Exam Vitals and nursing note reviewed.  Constitutional:      General: He is not in acute distress.    Appearance: Normal appearance. He is not ill-appearing, toxic-appearing or diaphoretic.  HENT:     Head: Normocephalic and atraumatic.     Right Ear: Tympanic membrane, ear canal and external ear normal. There is no impacted cerumen.     Left Ear: Tympanic membrane, ear canal and external ear normal. There is no impacted cerumen.     Nose: Nose normal. No congestion or rhinorrhea.     Mouth/Throat:     Mouth: Mucous membranes are moist.     Pharynx: Oropharynx is clear. No oropharyngeal exudate or posterior oropharyngeal erythema.  Eyes:     General: No scleral icterus.       Right eye: No discharge.        Left eye: No discharge.     Extraocular Movements: Extraocular movements intact.     Conjunctiva/sclera: Conjunctivae normal.     Pupils: Pupils are equal, round, and reactive to light.  Neck:     Vascular: No carotid bruit.  Cardiovascular:     Rate and Rhythm: Normal rate and regular rhythm.     Pulses:  Normal pulses.     Heart sounds: No murmur heard.    No friction rub. No gallop.  Pulmonary:     Effort: Pulmonary effort is normal. No respiratory distress.     Breath sounds: Normal breath sounds. No stridor. No wheezing, rhonchi or rales.  Chest:     Chest wall: No tenderness.  Abdominal:     General: Abdomen is flat. Bowel sounds are normal. There is no distension.     Palpations: Abdomen is soft. There is no mass.     Tenderness: There is no abdominal tenderness. There is no right CVA tenderness, left CVA tenderness, guarding or rebound.     Hernia: No hernia is present.  Genitourinary:    Comments: Genital exam deferred with shared decision making Musculoskeletal:        General: No swelling, tenderness, deformity or signs of injury.     Cervical back: Normal range of motion and neck supple. No rigidity. No muscular tenderness.     Right lower leg: No edema.     Left lower leg: No edema.  Lymphadenopathy:     Cervical: No cervical adenopathy.  Skin:    General: Skin is warm and dry.     Capillary Refill: Capillary refill takes less than 2 seconds.     Coloration: Skin is not jaundiced or pale.     Findings: No bruising, erythema, lesion or rash.  Neurological:     General: No focal deficit present.     Mental Status: He is alert and oriented to person, place, and time.     Cranial Nerves: No cranial nerve deficit.     Sensory: No sensory deficit.     Motor: No weakness.     Coordination: Coordination normal.     Gait: Gait normal.     Deep Tendon Reflexes:  Reflexes normal.  Psychiatric:        Mood and Affect: Mood normal.        Behavior: Behavior normal.        Thought Content: Thought content normal.        Judgment: Judgment normal.     Results for orders placed or performed in visit on 05/01/22  Urinalysis, Routine w reflex microscopic  Result Value Ref Range   Specific Gravity, UA <1.005 (L) 1.005 - 1.030   pH, UA 5.5 5.0 - 7.5   Color, UA Yellow Yellow    Appearance Ur Clear Clear   Leukocytes,UA Negative Negative   Protein,UA Negative Negative/Trace   Glucose, UA 3+ (A) Negative   Ketones, UA Trace (A) Negative   RBC, UA Negative Negative   Bilirubin, UA Negative Negative   Urobilinogen, Ur 0.2 0.2 - 1.0 mg/dL   Nitrite, UA Negative Negative   Microscopic Examination Comment   Bayer DCA Hb A1c Waived  Result Value Ref Range   HB A1C (BAYER DCA - WAIVED) 11.4 (H) 4.8 - 5.6 %  Microalbumin, Urine Waived  Result Value Ref Range   Microalb, Ur Waived 30 (H) 0 - 19 mg/L   Creatinine, Urine Waived 50 10 - 300 mg/dL   Microalb/Creat Ratio 30-300 (H) <30 mg/g      Assessment & Plan:   Problem List Items Addressed This Visit       Cardiovascular and Mediastinum   Hypertension    Under good control on current regimen. Continue current regimen. Continue to monitor. Call with any concerns. Refills given. Labs drawn today.        Relevant Orders   Comprehensive metabolic panel   TSH   Urinalysis, Routine w reflex microscopic (Completed)   Microalbumin, Urine Waived (Completed)     Endocrine   Type 2 diabetes mellitus with hyperglycemia (HCC)    A1c has jumped from 6.3 to 11.8 without any changes in diet or stopping medicine. He has been on steroids recently. We will send out A1c to confirm. If actually that high, will start rybelsus. Recheck 3 months. Call with any concerns.       Relevant Medications   Semaglutide (RYBELSUS) 3 MG TABS   Other Relevant Orders   Comprehensive metabolic panel   Bayer DCA Hb A1c Waived (Completed)   Microalbumin, Urine Waived (Completed)   Hgb A1c w/o eAG   Hgb A1c w/o eAG     Other   Hyperlipidemia    Rechecking labs today. Await results. Treat as needed.       Relevant Orders   Comprehensive metabolic panel   Lipid Panel w/o Chol/HDL Ratio   Other Visit Diagnoses     Routine general medical examination at a health care facility    -  Primary   Vaccines up to date. screening labs  checked today. Colonoscopy ordered. Continue diet and exercise. Call with any concerns.   Relevant Orders   Comprehensive metabolic panel   CBC with Differential/Platelet   Lipid Panel w/o Chol/HDL Ratio   PSA   TSH   Urinalysis, Routine w reflex microscopic (Completed)   Bayer DCA Hb A1c Waived (Completed)   Microalbumin, Urine Waived (Completed)   Screening for colon cancer       Referral to GI placed today.   Relevant Orders   Ambulatory referral to Gastroenterology        LABORATORY TESTING:  Health maintenance labs ordered today as discussed above.   The natural history  of prostate cancer and ongoing controversy regarding screening and potential treatment outcomes of prostate cancer has been discussed with the patient. The meaning of a false positive PSA and a false negative PSA has been discussed. He indicates understanding of the limitations of this screening test and wishes to proceed with screening PSA testing.   IMMUNIZATIONS:   - Tdap: Tetanus vaccination status reviewed: last tetanus booster within 10 years. - Influenza: Up to date - Pneumovax: Up to date - Prevnar: Not applicable - COVID: Up to date - HPV: Not applicable - Shingrix vaccine: Up to date  SCREENING: - Colonoscopy: Ordered today  Discussed with patient purpose of the colonoscopy is to detect colon cancer at curable precancerous or early stages   PATIENT COUNSELING:    Sexuality: Discussed sexually transmitted diseases, partner selection, use of condoms, avoidance of unintended pregnancy  and contraceptive alternatives.   Advised to avoid cigarette smoking.  I discussed with the patient that most people either abstain from alcohol or drink within safe limits (<=14/week and <=4 drinks/occasion for males, <=7/weeks and <= 3 drinks/occasion for females) and that the risk for alcohol disorders and other health effects rises proportionally with the number of drinks per week and how often a drinker  exceeds daily limits.  Discussed cessation/primary prevention of drug use and availability of treatment for abuse.   Diet: Encouraged to adjust caloric intake to maintain  or achieve ideal body weight, to reduce intake of dietary saturated fat and total fat, to limit sodium intake by avoiding high sodium foods and not adding table salt, and to maintain adequate dietary potassium and calcium preferably from fresh fruits, vegetables, and low-fat dairy products.    stressed the importance of regular exercise  Injury prevention: Discussed safety belts, safety helmets, smoke detector, smoking near bedding or upholstery.   Dental health: Discussed importance of regular tooth brushing, flossing, and dental visits.   Follow up plan: NEXT PREVENTATIVE PHYSICAL DUE IN 1 YEAR. Return in about 3 months (around 08/01/2022).

## 2022-05-01 NOTE — Assessment & Plan Note (Signed)
A1c has jumped from 6.3 to 11.8 without any changes in diet or stopping medicine. He has been on steroids recently. We will send out A1c to confirm. If actually that high, will start rybelsus. Recheck 3 months. Call with any concerns.

## 2022-05-01 NOTE — Assessment & Plan Note (Signed)
Under good control on current regimen. Continue current regimen. Continue to monitor. Call with any concerns. Refills given. Labs drawn today.   

## 2022-05-01 NOTE — Assessment & Plan Note (Signed)
Rechecking labs today. Await results. Treat as needed.  °

## 2022-05-02 ENCOUNTER — Telehealth: Payer: Self-pay

## 2022-05-02 ENCOUNTER — Other Ambulatory Visit: Payer: Self-pay

## 2022-05-02 DIAGNOSIS — Z86010 Personal history of colonic polyps: Secondary | ICD-10-CM

## 2022-05-02 LAB — COMPREHENSIVE METABOLIC PANEL
ALT: 16 IU/L (ref 0–44)
AST: 13 IU/L (ref 0–40)
Albumin/Globulin Ratio: 1.6 (ref 1.2–2.2)
Albumin: 4.1 g/dL (ref 3.8–4.9)
Alkaline Phosphatase: 168 IU/L — ABNORMAL HIGH (ref 44–121)
BUN/Creatinine Ratio: 12 (ref 9–20)
BUN: 12 mg/dL (ref 6–24)
Bilirubin Total: 0.3 mg/dL (ref 0.0–1.2)
CO2: 21 mmol/L (ref 20–29)
Calcium: 9.4 mg/dL (ref 8.7–10.2)
Chloride: 94 mmol/L — ABNORMAL LOW (ref 96–106)
Creatinine, Ser: 1.03 mg/dL (ref 0.76–1.27)
Globulin, Total: 2.6 g/dL (ref 1.5–4.5)
Glucose: 500 mg/dL — ABNORMAL HIGH (ref 70–99)
Potassium: 3.5 mmol/L (ref 3.5–5.2)
Sodium: 133 mmol/L — ABNORMAL LOW (ref 134–144)
Total Protein: 6.7 g/dL (ref 6.0–8.5)
eGFR: 86 mL/min/{1.73_m2} (ref >59)

## 2022-05-02 LAB — CBC WITH DIFFERENTIAL/PLATELET
Basophils Absolute: 0 10*3/uL (ref 0.0–0.2)
Basos: 0 %
EOS (ABSOLUTE): 0.2 10*3/uL (ref 0.0–0.4)
Eos: 2 %
Hematocrit: 44.2 % (ref 37.5–51.0)
Hemoglobin: 15.4 g/dL (ref 13.0–17.7)
Immature Grans (Abs): 0 10*3/uL (ref 0.0–0.1)
Immature Granulocytes: 0 %
Lymphocytes Absolute: 1.8 10*3/uL (ref 0.7–3.1)
Lymphs: 19 %
MCH: 32.7 pg (ref 26.6–33.0)
MCHC: 34.8 g/dL (ref 31.5–35.7)
MCV: 94 fL (ref 79–97)
Monocytes Absolute: 0.5 10*3/uL (ref 0.1–0.9)
Monocytes: 6 %
Neutrophils Absolute: 7 10*3/uL (ref 1.4–7.0)
Neutrophils: 73 %
Platelets: 235 10*3/uL (ref 150–450)
RBC: 4.71 x10E6/uL (ref 4.14–5.80)
RDW: 11.5 % — ABNORMAL LOW (ref 11.6–15.4)
WBC: 9.6 10*3/uL (ref 3.4–10.8)

## 2022-05-02 LAB — LIPID PANEL W/O CHOL/HDL RATIO
Cholesterol, Total: 200 mg/dL — ABNORMAL HIGH (ref 100–199)
HDL: 40 mg/dL (ref >39)
LDL Chol Calc (NIH): 110 mg/dL — ABNORMAL HIGH (ref 0–99)
Triglycerides: 291 mg/dL — ABNORMAL HIGH (ref 0–149)
VLDL Cholesterol Cal: 50 mg/dL — ABNORMAL HIGH (ref 5–40)

## 2022-05-02 LAB — HGB A1C W/O EAG: Hgb A1c MFr Bld: 11.2 % — ABNORMAL HIGH (ref 4.8–5.6)

## 2022-05-02 LAB — PSA: Prostate Specific Ag, Serum: 2 ng/mL (ref 0.0–4.0)

## 2022-05-02 LAB — TSH: TSH: 1.11 u[IU]/mL (ref 0.450–4.500)

## 2022-05-02 MED ORDER — RYBELSUS 7 MG PO TABS: 7.0000 mg | ORAL_TABLET | Freq: Every day | ORAL | 3 refills | Status: DC

## 2022-05-02 MED ORDER — NA SULFATE-K SULFATE-MG SULF 17.5-3.13-1.6 GM/177ML PO SOLN: 1.0000 | Freq: Once | ORAL | 0 refills | Status: AC

## 2022-05-02 NOTE — Addendum Note (Signed)
Addended by: Valerie Roys on: 05/02/2022 08:51 AM   Modules accepted: Orders

## 2022-05-02 NOTE — Telephone Encounter (Signed)
Gastroenterology Pre-Procedure Review  Request Date: 05/16/22 Requesting Physician: Dr. Vicente Males  PATIENT REVIEW QUESTIONS: The patient responded to the following health history questions as indicated:    1. Are you having any GI issues? no 2. Do you have a personal history of Polyps? yes (last colonoscopy 03/30/19 with Dr. Vicente Males) 3. Do you have a family history of Colon Cancer or Polyps? no 4. Diabetes Mellitus? yes (Metformin stop 3 days before procedure on 03/17 Rybelsus stop 7 days before procedure on 03/13) 5. Joint replacements in the past 12 months?no 6. Major health problems in the past 3 months?no 7. Any artificial heart valves, MVP, or defibrillator?no    MEDICATIONS & ALLERGIES:    Patient reports the following regarding taking any anticoagulation/antiplatelet therapy:   Plavix, Coumadin, Eliquis, Xarelto, Lovenox, Pradaxa, Brilinta, or Effient? no Aspirin? yes ('81mg'$ )  Patient confirms/reports the following medications:  Current Outpatient Medications  Medication Sig Dispense Refill   allopurinol (ZYLOPRIM) 100 MG tablet Take 2 tablets (200 mg total) by mouth daily. 180 tablet 1   amLODipine (NORVASC) 10 MG tablet Take 1 tablet (10 mg total) by mouth daily. 90 tablet 1   aspirin 81 MG tablet Take 81 mg by mouth daily.     Aspirin-Salicylamide-Caffeine (BC HEADACHE PO) Take 1 packet by mouth daily as needed (headaches).     Blood Glucose Monitoring Suppl (FIFTY50 GLUCOSE METER 2.0) w/Device KIT Use to check fasting blood sugars daily     Blood Glucose Monitoring Suppl (ONE TOUCH ULTRA 2) w/Device KIT 1 each by Does not apply route daily. 1 kit 0   fluticasone (FLONASE) 50 MCG/ACT nasal spray Place 2 sprays into both nostrils daily. 16 g 6   glucose blood (ONETOUCH ULTRA) test strip 1 each by Other route daily. 100 each 3   lisinopril (ZESTRIL) 20 MG tablet Take 1 tablet (20 mg total) by mouth daily. 90 tablet 1   metFORMIN (GLUCOPHAGE XR) 500 MG 24 hr tablet Take 2 tablets (1,000  mg total) by mouth 2 (two) times daily. 360 tablet 1   Multiple Vitamin (MULTIVITAMIN) tablet Take 1 tablet by mouth daily.     OneTouch Delica Lancets 99991111 MISC 1 each by Does not apply route daily. 100 each 3   Semaglutide (RYBELSUS) 3 MG TABS Take 1 tablet (3 mg total) by mouth daily. 30 tablet 0   Semaglutide (RYBELSUS) 7 MG TABS Take 1 tablet (7 mg total) by mouth daily. 30 tablet 3   tadalafil (CIALIS) 20 MG tablet TAKE 1/2 TO 1 TABLET (10-20 MG TOTAL) BY MOUTH EVERY OTHER DAY AS NEEDED FOR ERECTILE DYSFUNCTION 10 tablet 11   No current facility-administered medications for this visit.    Patient confirms/reports the following allergies:  Allergies  Allergen Reactions   Gabapentin Swelling    No orders of the defined types were placed in this encounter.   AUTHORIZATION INFORMATION Primary Insurance: 1D#: Group #:  Secondary Insurance: 1D#: Group #:  SCHEDULE INFORMATION: Date:  Time: Location:

## 2022-05-09 ENCOUNTER — Telehealth: Payer: Self-pay | Admitting: Family Medicine

## 2022-05-09 NOTE — Telephone Encounter (Signed)
Pt stated medication Semaglutide (RYBELSUS) 3 MG TABS even with insurance is very expensive. Pt is asking if there is an alternative. He asked that I mention he is still taking the medication metFORMIN (GLUCOPHAGE XR) 500 MG 24 hr tablet.  Pt stated he wanted to let PCP know bs is coming down; he tested it this morning before he ate, and it was 224.  Please advise.

## 2022-05-16 ENCOUNTER — Encounter
Admission: RE | Disposition: A | Discharge status: Home / Self Care | Payer: Self-pay | Source: Ambulatory Visit | Attending: Gastroenterology

## 2022-05-16 ENCOUNTER — Encounter: Payer: Self-pay | Admitting: Gastroenterology

## 2022-05-16 ENCOUNTER — Ambulatory Visit: Payer: BC Managed Care – PPO | Admitting: Anesthesiology

## 2022-05-16 ENCOUNTER — Ambulatory Visit
Admission: RE | Admit: 2022-05-16 | Discharge: 2022-05-16 | Disposition: A | Discharge status: Home / Self Care | Payer: BC Managed Care – PPO | Source: Ambulatory Visit | Attending: Gastroenterology | Admitting: Gastroenterology

## 2022-05-16 DIAGNOSIS — I10 Essential (primary) hypertension: Secondary | ICD-10-CM | POA: Insufficient documentation

## 2022-05-16 DIAGNOSIS — K5730 Diverticulosis of large intestine without perforation or abscess without bleeding: Secondary | ICD-10-CM | POA: Insufficient documentation

## 2022-05-16 DIAGNOSIS — Z860100 Personal history of colon polyps, unspecified: Secondary | ICD-10-CM

## 2022-05-16 DIAGNOSIS — D126 Benign neoplasm of colon, unspecified: Secondary | ICD-10-CM | POA: Diagnosis not present

## 2022-05-16 DIAGNOSIS — Z1211 Encounter for screening for malignant neoplasm of colon: Secondary | ICD-10-CM | POA: Diagnosis not present

## 2022-05-16 DIAGNOSIS — D124 Benign neoplasm of descending colon: Secondary | ICD-10-CM | POA: Diagnosis not present

## 2022-05-16 DIAGNOSIS — F17210 Nicotine dependence, cigarettes, uncomplicated: Secondary | ICD-10-CM | POA: Insufficient documentation

## 2022-05-16 DIAGNOSIS — E119 Type 2 diabetes mellitus without complications: Secondary | ICD-10-CM | POA: Insufficient documentation

## 2022-05-16 DIAGNOSIS — K635 Polyp of colon: Secondary | ICD-10-CM | POA: Diagnosis not present

## 2022-05-16 DIAGNOSIS — G709 Myoneural disorder, unspecified: Secondary | ICD-10-CM | POA: Insufficient documentation

## 2022-05-16 DIAGNOSIS — Z7984 Long term (current) use of oral hypoglycemic drugs: Secondary | ICD-10-CM | POA: Diagnosis not present

## 2022-05-16 HISTORY — PX: COLONOSCOPY WITH PROPOFOL: SHX5780

## 2022-05-16 LAB — GLUCOSE, CAPILLARY: Glucose-Capillary: 199 mg/dL — ABNORMAL HIGH (ref 70–99)

## 2022-05-16 SURGERY — COLONOSCOPY WITH PROPOFOL
Anesthesia: General

## 2022-05-16 MED ORDER — SODIUM CHLORIDE 0.9 % IV SOLN
INTRAVENOUS | Status: DC
  Administered 2022-05-16: 20 mL/h via INTRAVENOUS

## 2022-05-16 MED ORDER — STERILE WATER FOR IRRIGATION IR SOLN
Status: DC | PRN
  Administered 2022-05-16: 60 mL

## 2022-05-16 MED ORDER — DEXMEDETOMIDINE HCL IN NACL 200 MCG/50ML IV SOLN
INTRAVENOUS | Status: DC | PRN
  Administered 2022-05-16: 4 ug via INTRAVENOUS

## 2022-05-16 MED ORDER — LIDOCAINE HCL (CARDIAC) PF 100 MG/5ML IV SOSY
PREFILLED_SYRINGE | INTRAVENOUS | Status: DC | PRN
  Administered 2022-05-16: 50 mg via INTRAVENOUS

## 2022-05-16 MED ORDER — PROPOFOL 10 MG/ML IV BOLUS
INTRAVENOUS | Status: DC | PRN
  Administered 2022-05-16: 30 mg via INTRAVENOUS
  Administered 2022-05-16: 150 ug/kg/min via INTRAVENOUS
  Administered 2022-05-16: 50 mg via INTRAVENOUS

## 2022-05-16 NOTE — Transfer of Care (Signed)
Immediate Anesthesia Transfer of Care Note  Patient: Chase Hull  Procedure(s) Performed: COLONOSCOPY WITH PROPOFOL  Patient Location: PACU  Anesthesia Type:General  Level of Consciousness: drowsy  Airway & Oxygen Therapy: Patient Spontanous Breathing  Post-op Assessment: Report given to RN and Post -op Vital signs reviewed and stable  Post vital signs: Reviewed  Last Vitals:  Vitals Value Taken Time  BP 100/56 05/16/22 0946  Temp    Pulse 73 05/16/22 0947  Resp 29 05/16/22 0947  SpO2 99 % 05/16/22 0947  Vitals shown include unvalidated device data.  Last Pain:  Vitals:   05/16/22 0848  TempSrc: Temporal  PainSc: 0-No pain         Complications: There were no known notable events for this encounter.

## 2022-05-16 NOTE — H&P (Signed)
Chase Bellows, MD [address], [place], [place], [place], [address] [address], [place], [place], [place], [address] Phone: [phone]  Fax: [phone]  Primary Care Physician:  Valerie Roys, DO   Pre-Procedure History & Physical: HPI:  Chase Hull is a 57 y.o. male is here for an colonoscopy.   Past Medical History:  Diagnosis Date   Carpal tunnel syndrome    Diabetes mellitus without complication ([facility])    no meds, A1C down   Gout    Hypertension     Past Surgical History:  Procedure Laterality Date   COLONOSCOPY WITH PROPOFOL N/A 03/30/2019   Procedure: COLONOSCOPY WITH BIOPSY;  Surgeon: Chase Bellows, MD;  Location: [facility];  Service: Endoscopy;  Laterality: N/A;  Priority 4   FRACTURE SURGERY     left ankle    Left hand surgery  03/2018   Carpal tunnel   LUMBAR LAMINECTOMY/DECOMPRESSION MICRODISCECTOMY N/A 08/03/2020   Procedure: L3-4 AND L5-S1 DECOMPRESSION, RIGHT L4-5 MICRODISCECTOMY;  Surgeon: Meade Maw, MD;  Location: [facility];  Service: Neurosurgery;  Laterality: N/A;   POLYPECTOMY N/A 03/30/2019   Procedure: POLYPECTOMY;  Surgeon: Chase Bellows, MD;  Location: [facility];  Service: Endoscopy;  Laterality: N/A;   TONSILLECTOMY     adniods   TOOTH EXTRACTION      Prior to Admission medications   Medication Sig Start Date End Date Taking? Authorizing Provider  allopurinol (ZYLOPRIM) 100 MG tablet Take 2 tablets (200 mg total) by mouth daily. 03/21/22  Yes Johnson, Megan P, DO  amLODipine (NORVASC) 10 MG tablet Take 1 tablet (10 mg total) by mouth daily. 03/21/22  Yes Johnson, Megan P, DO  aspirin 81 MG tablet Take 81 mg by mouth daily.   Yes [provider]  Aspirin-Salicylamide-Caffeine (BC HEADACHE PO) Take 1 packet by mouth daily as needed (headaches).   Yes [provider]  Blood Glucose Monitoring Suppl (FIFTY50 GLUCOSE METER 2.0) w/Device KIT Use to check fasting blood sugars daily 12/15/13  Yes  [provider]  Blood Glucose Monitoring Suppl (ONE TOUCH ULTRA 2) w/Device KIT 1 each by Does not apply route daily. 01/05/21  Yes Johnson, Megan P, DO  fluticasone (FLONASE) 50 MCG/ACT nasal spray Place 2 sprays into both nostrils daily. 09/26/21  Yes Johnson, Megan P, DO  glucose blood (ONETOUCH ULTRA) test strip 1 each by Other route daily. 01/05/21  Yes Johnson, Megan P, DO  lisinopril (ZESTRIL) 20 MG tablet Take 1 tablet (20 mg total) by mouth daily. 03/21/22  Yes Johnson, Megan P, DO  metFORMIN (GLUCOPHAGE XR) 500 MG 24 hr tablet Take 2 tablets (1,000 mg total) by mouth 2 (two) times daily. 03/21/22  Yes Johnson, Megan P, DO  Multiple Vitamin (MULTIVITAMIN) tablet Take 1 tablet by mouth daily.   Yes [provider]  OneTouch Delica Lancets 99991111 MISC 1 each by Does not apply route daily. 01/05/21  Yes Johnson, Megan P, DO  Semaglutide (RYBELSUS) 3 MG TABS Take 1 tablet (3 mg total) by mouth daily. 05/01/22  Yes Johnson, Megan P, DO  tadalafil (CIALIS) 20 MG tablet TAKE 1/2 TO 1 TABLET (10-20 MG TOTAL) BY MOUTH EVERY OTHER DAY AS NEEDED FOR ERECTILE DYSFUNCTION 04/23/22  Yes Johnson, Megan P, DO  Semaglutide (RYBELSUS) 7 MG TABS Take 1 tablet (7 mg total) by mouth daily. Patient not taking: Reported on 05/16/2022 05/02/22   Park Liter P, DO    Allergies as of 05/02/2022 - Review Complete 05/01/2022  Allergen Reaction  Noted   Gabapentin Swelling 10/21/2019    No family history on file.  Social History   Socioeconomic History   Marital status: Single    Spouse name: Not on file   Number of children: Not on file   Years of education: Not on file   Highest education level: Not on file  Occupational History   Not on file  Tobacco Use   Smoking status: Every Day    Packs/day: 0.50    Years: 25.00    Additional pack years: 0.00    Total pack years: 12.50    Types: Cigarettes   Smokeless tobacco: Never  Vaping Use   Vaping Use: Never used  Substance and Sexual  Activity   Alcohol use: Not Currently   Drug use: Never   Sexual activity: Not Currently  Other Topics Concern   Not on file  Social History Narrative   Lives with Mom   Social Determinants of Health   Financial Resource Strain: Not on file  Food Insecurity: Not on file  Transportation Needs: Not on file  Physical Activity: Not on file  Stress: Not on file  Social Connections: Not on file  Intimate Partner Violence: Not on file    Review of Systems: See HPI, otherwise negative ROS  Physical Exam: BP (!) 135/95   Pulse 87   Temp 98.4 F (36.9 C) (Temporal)   Resp 16   Ht 6' (1.829 m)   Wt 98.4 kg   SpO2 99%   BMI 29.43 kg/m  General:   Alert,  pleasant and cooperative in NAD Head:  Normocephalic and atraumatic. Neck:  Supple; no masses or thyromegaly. Lungs:  Clear throughout to auscultation, normal respiratory effort.    Heart:  +S1, +S2, Regular rate and rhythm, No edema. Abdomen:  Soft, nontender and nondistended. Normal bowel sounds, without guarding, and without rebound.   Neurologic:  Alert and  oriented x4;  grossly normal neurologically.  Impression/Plan: Chase Hull is here for an colonoscopy to be performed for surveillance due to prior history of colon polyps   Risks, benefits, limitations, and alternatives regarding  colonoscopy have been reviewed with the patient.  Questions have been answered.  All parties agreeable.   Chase Bellows, MD  05/16/2022, 9:09 AM \

## 2022-05-16 NOTE — Anesthesia Preprocedure Evaluation (Signed)
Anesthesia Evaluation  Patient identified by MRN, date of birth, ID band Patient awake    Reviewed: Allergy & Precautions, NPO status , Patient's Chart, lab work & pertinent test results  History of Anesthesia Complications Negative for: history of anesthetic complications  Airway Mallampati: II  TM Distance: >3 FB Neck ROM: Full    Dental  (+) Teeth Intact, Dental Advidsory Given   Pulmonary neg shortness of breath, neg sleep apnea, neg COPD, neg recent URI, Current Smoker and Patient abstained from smoking.   Pulmonary exam normal breath sounds clear to auscultation       Cardiovascular Exercise Tolerance: Good METShypertension, (-) angina (-) CAD and (-) Past MI (-) dysrhythmias  Rhythm:Regular Rate:Normal - Systolic murmurs    Neuro/Psych  Neuromuscular disease  negative psych ROS   GI/Hepatic ,neg GERD  ,,(+)     (-) substance abuse    Endo/Other  diabetes, Well Controlled, Type 2    Renal/GU negative Renal ROS     Musculoskeletal   Abdominal   Peds  Hematology   Anesthesia Other Findings Past Medical History: No date: Carpal tunnel syndrome No date: Diabetes mellitus without complication (HCC)     Comment:  no meds, A1C down No date: Gout No date: Hypertension  Reproductive/Obstetrics                             Anesthesia Physical Anesthesia Plan  ASA: 2  Anesthesia Plan: General   Post-op Pain Management:    Induction: Intravenous  PONV Risk Score and Plan: 1 and Propofol infusion, TIVA and Treatment may vary due to age or medical condition  Airway Management Planned: Natural Airway and Nasal Cannula  Additional Equipment: None  Intra-op Plan:   Post-operative Plan:   Informed Consent: I have reviewed the patients History and Physical, chart, labs and discussed the procedure including the risks, benefits and alternatives for the proposed anesthesia with the  patient or authorized representative who has indicated his/her understanding and acceptance.     Dental advisory given  Plan Discussed with: CRNA and Surgeon  Anesthesia Plan Comments:         Anesthesia Quick Evaluation

## 2022-05-16 NOTE — Anesthesia Postprocedure Evaluation (Signed)
Anesthesia Post Note  Patient: Chase Hull  Procedure(s) Performed: COLONOSCOPY WITH PROPOFOL  Patient location during evaluation: Endoscopy Anesthesia Type: General Level of consciousness: awake and alert Pain management: pain level controlled Vital Signs Assessment: post-procedure vital signs reviewed and stable Respiratory status: spontaneous breathing, nonlabored ventilation, respiratory function stable and patient connected to nasal cannula oxygen Cardiovascular status: blood pressure returned to baseline and stable Postop Assessment: no apparent nausea or vomiting Anesthetic complications: no   There were no known notable events for this encounter.   Last Vitals:  Vitals:   05/16/22 0946 05/16/22 1006  BP: (!) 100/56 122/77  Pulse: 73 73  Resp:    Temp: (!) 35.5 C   SpO2: 99% 100%    Last Pain:  Vitals:   05/16/22 0946  TempSrc: Temporal  PainSc: Asleep                 Martha Clan

## 2022-05-16 NOTE — Op Note (Addendum)
[facility] Gastroenterology Patient Name: Chase Hull Procedure Date: 05/16/2022 9:22 AM MRN: [id number] Account #: [id number] Date of Birth: 1965/06/25 Admit Type: Outpatient Age: 57 Room: [facility] ENDO ROOM 3 Gender: Male Note Status: Finalized Instrument Name: Jasper Riling [id number] Procedure:             Colonoscopy Indications:           Surveillance: Personal history of adenomatous polyps                         on last colonoscopy 3 years ago, Last colonoscopy:                         February 2021 Providers:             Jonathon Bellows MD, MD Referring MD:          Valerie Roys (Referring MD) Medicines:             Monitored Anesthesia Care Complications:         No immediate complications. Procedure:             Pre-Anesthesia Assessment:                        - Prior to the procedure, a History and Physical was                         performed, and patient medications, allergies and                         sensitivities were reviewed. The patient's tolerance                         of previous anesthesia was reviewed.                        - The risks and benefits of the procedure and the                         sedation options and risks were discussed with the                         patient. All questions were answered and informed                         consent was obtained.                        - ASA Grade Assessment: II - A patient with mild                         systemic disease.                        After obtaining informed consent, the colonoscope was                         passed under direct vision. Throughout the procedure,                         the patient's blood pressure,  pulse, and oxygen                         saturations were monitored continuously. The                         Colonoscope was introduced through the anus and                         advanced to the the cecum, identified by the                         appendiceal  orifice. The colonoscopy was performed                         with ease. The patient tolerated the procedure well.                         The quality of the bowel preparation was excellent.                         The ileocecal valve, appendiceal orifice, and rectum                         were photographed. Findings:      The perianal and digital rectal examinations were normal.      A few small-mouthed diverticula were found in the sigmoid colon.      A 5 mm polyp was found in the descending colon. The polyp was sessile.       The polyp was removed with a cold snare. Resection and retrieval were       complete.      The exam was otherwise without abnormality on direct and retroflexion       views. Impression:            - Diverticulosis in the sigmoid colon.                        - One 5 mm polyp in the descending colon, removed with                         a cold snare. Resected and retrieved.                        - The examination was otherwise normal on direct and                         retroflexion views. Recommendation:        - Discharge patient to home (with escort).                        - Resume previous diet.                        - Continue present medications.                        - Await pathology results.                        -  Repeat colonoscopy for surveillance based on                         pathology results. Procedure Code(s):     --- Professional ---                        [phone], Colonoscopy, flexible; with removal of                         tumor(s), polyp(s), or other lesion(s) by snare                         technique Diagnosis Code(s):     --- Professional ---                        Z86.010, Personal history of colonic polyps                        D12.4, Benign neoplasm of descending colon                        K57.30, Diverticulosis of large intestine without                         perforation or abscess without bleeding CPT copyright 2022  American Medical Association. All rights reserved. The codes documented in this report are preliminary and upon coder review may  be revised to meet current compliance requirements. Jonathon Bellows, MD Jonathon Bellows MD, MD 05/16/2022 9:46:22 AM This report has been signed electronically. Number of Addenda: 0 Note Initiated On: 05/16/2022 9:22 AM Scope Withdrawal Time: 0 hours 9 minutes 30 seconds  Total Procedure Duration: 0 hours 12 minutes 6 seconds  Estimated Blood Loss:  Estimated blood loss: none.      [facility]

## 2022-05-17 ENCOUNTER — Encounter: Payer: Self-pay | Admitting: Gastroenterology

## 2022-05-17 LAB — SURGICAL PATHOLOGY

## 2022-05-21 NOTE — Telephone Encounter (Signed)
He can go to [email or web address] and should be able to get a coupon.

## 2022-05-21 NOTE — Telephone Encounter (Signed)
Left a message for patient to give our office a call back to discuss Dr Durenda Age recommendations.   OK for [facility] to give note if patient calls back.

## 2022-05-23 NOTE — Telephone Encounter (Signed)
Left a message for patient to give our office a call back to discuss Dr Durenda Age recommendations.    OK for [facility] to give note if patient calls back.

## 2022-06-20 ENCOUNTER — Ambulatory Visit: Payer: BC Managed Care – PPO | Admitting: Family Medicine

## 2022-07-18 NOTE — Progress Notes (Unsigned)
I,[name],acting as a Neurosurgeon for Chase Kindle, FNP.,have documented all relevant documentation on the behalf of Chase Kindle, FNP,as directed by  Chase Kindle, FNP while in the presence of Chase Kindle, FNP.   New patient visit  Patient: Chase Hull   DOB: 1966-02-01   57 y.o. Male  MRN: [id number] Visit Date: 07/19/2022  Today's healthcare provider: [name], FNP  Patient presents for new patient visit to establish care.  Introduced to Publishing rights manager role and practice setting.  All questions answered.  Discussed provider/patient relationship and expectations.  Subjective    [name] is a 57 y.o. male who presents today as a new patient to establish care.   HPI    Past Medical History:  Diagnosis Date   Allergy    Carpal tunnel syndrome    Diabetes mellitus without complication (HCC)    no meds, A1C down   Gout    Hypertension    Past Surgical History:  Procedure Laterality Date   COLONOSCOPY WITH PROPOFOL N/A 03/30/2019   Procedure: COLONOSCOPY WITH BIOPSY;  Surgeon: Wyline Mood, MD;  Location: [facility];  Service: Endoscopy;  Laterality: N/A;  Priority 4   COLONOSCOPY WITH PROPOFOL N/A 05/16/2022   Procedure: COLONOSCOPY WITH PROPOFOL;  Surgeon: Wyline Mood, MD;  Location: [facility];  Service: Gastroenterology;  Laterality: N/A;   FRACTURE SURGERY     left ankle    Left hand surgery  03/2018   Carpal tunnel   LUMBAR LAMINECTOMY/DECOMPRESSION MICRODISCECTOMY N/A 08/03/2020   Procedure: L3-4 AND L5-S1 DECOMPRESSION, RIGHT L4-5 MICRODISCECTOMY;  Surgeon: Venetia Night, MD;  Location: [facility];  Service: Neurosurgery;  Laterality: N/A;   POLYPECTOMY N/A 03/30/2019   Procedure: POLYPECTOMY;  Surgeon: Wyline Mood, MD;  Location: [facility];  Service: Endoscopy;  Laterality: N/A;   SPINE SURGERY     TONSILLECTOMY     adniods   TOOTH EXTRACTION     Family Status  Relation Name Status   Mother  Alive   Father   Deceased   Brother  Deceased   MGM  Deceased   MGF  Deceased   PGM  Deceased   PGF  Deceased   History reviewed. No pertinent family history. Social History   Socioeconomic History   Marital status: Single    Spouse name: Not on file   Number of children: Not on file   Years of education: Not on file   Highest education level: Not on file  Occupational History   Not on file  Tobacco Use   Smoking status: Some Days    Packs/day: 0.50    Years: 25.00    Additional pack years: 0.00    Total pack years: 12.50    Types: Cigarettes   Smokeless tobacco: Never  Vaping Use   Vaping Use: Never used  Substance and Sexual Activity   Alcohol use: Not Currently   Drug use: Never   Sexual activity: Not Currently    Birth control/protection: None  Other Topics Concern   Not on file  Social History Narrative   Lives with Mom   Social Determinants of Health   Financial Resource Strain: Not on file  Food Insecurity: Not on file  Transportation Needs: Not on file  Physical Activity: Not on file  Stress: Not on file  Social Connections: Not on file   Outpatient Medications Prior to Visit  Medication Sig   allopurinol (ZYLOPRIM) 100 MG tablet Take 2 tablets (  200 mg total) by mouth daily.   amLODipine (NORVASC) 10 MG tablet Take 1 tablet (10 mg total) by mouth daily.   aspirin 81 MG tablet Take 81 mg by mouth daily.   Aspirin-Salicylamide-Caffeine (BC HEADACHE PO) Take 1 packet by mouth daily as needed (headaches).   Blood Glucose Monitoring Suppl (FIFTY50 GLUCOSE METER 2.0) w/Device KIT Use to check fasting blood sugars daily   Blood Glucose Monitoring Suppl (ONE TOUCH ULTRA 2) w/Device KIT 1 each by Does not apply route daily.   fluticasone (FLONASE) 50 MCG/ACT nasal spray Place 2 sprays into both nostrils daily.   glucose blood (ONETOUCH ULTRA) test strip 1 each by Other route daily.   lisinopril (ZESTRIL) 20 MG tablet Take 1 tablet (20 mg total) by mouth daily.   metFORMIN  (GLUCOPHAGE XR) 500 MG 24 hr tablet Take 2 tablets (1,000 mg total) by mouth 2 (two) times daily.   Multiple Vitamin (MULTIVITAMIN) tablet Take 1 tablet by mouth daily.   OneTouch Delica Lancets 33G MISC 1 each by Does not apply route daily.   tadalafil (CIALIS) 20 MG tablet TAKE 1/2 TO 1 TABLET (10-20 MG TOTAL) BY MOUTH EVERY OTHER DAY AS NEEDED FOR ERECTILE DYSFUNCTION   [DISCONTINUED] Semaglutide (RYBELSUS) 3 MG TABS Take 1 tablet (3 mg total) by mouth daily.   [DISCONTINUED] Semaglutide (RYBELSUS) 7 MG TABS Take 1 tablet (7 mg total) by mouth daily. (Patient not taking: Reported on 05/16/2022)   No facility-administered medications prior to visit.   Allergies  Allergen Reactions   Gabapentin Swelling    Immunization History  Administered Date(s) Administered   Influenza Split 12/09/2013   Influenza, Seasonal, Injecte, Preservative Fre 11/26/2014   Influenza,inj,Quad PF,6+ Mos 02/06/2019, 12/26/2020   Influenza-Unspecified 12/12/2017, 12/10/2019, 11/26/2021   PPD Test 10/05/2015   Pneumococcal Conjugate-13 12/15/2013   Pneumococcal Polysaccharide-23 08/26/2009, 11/28/2012   Td 02/27/2008   Td (Adult), 2 Lf Tetanus Toxid, Preservative Free 02/27/2008   Tdap 05/24/2016   Zoster Recombinat (Shingrix) 09/26/2021, 12/27/2021    Health Maintenance  Topic Date Due   COVID-19 Vaccine (1) Never done   INFLUENZA VACCINE  09/27/2022   HEMOGLOBIN A1C  11/01/2022   FOOT EXAM  03/22/2023   OPHTHALMOLOGY EXAM  04/12/2023   Diabetic kidney evaluation - eGFR measurement  05/01/2023   Diabetic kidney evaluation - Urine ACR  05/01/2023   Colonoscopy  05/15/2025   DTaP/Tdap/Td (3 - Td or Tdap) 05/25/2026   Hepatitis C Screening  Completed   HIV Screening  Completed   Zoster Vaccines- Shingrix  Completed   HPV VACCINES  Aged Out    Patient Care Team: Chase Kindle, FNP as PCP - General (Family Medicine)  Review of Systems  HENT:  Positive for congestion and sinus pressure.    Last  metabolic panel Lab Results  Component Value Date   GLUCOSE 500 (H) 05/01/2022   NA 133 (L) 05/01/2022   K 3.5 05/01/2022   CL 94 (L) 05/01/2022   CO2 21 05/01/2022   BUN 12 05/01/2022   CREATININE 1.03 05/01/2022   EGFR 86 05/01/2022   CALCIUM 9.4 05/01/2022   PROT 6.7 05/01/2022   ALBUMIN 4.1 05/01/2022   LABGLOB 2.6 05/01/2022   AGRATIO 1.6 05/01/2022   BILITOT 0.3 05/01/2022   ALKPHOS 168 (H) 05/01/2022   AST 13 05/01/2022   ALT 16 05/01/2022   ANIONGAP 10 07/28/2020   Last lipids Lab Results  Component Value Date   CHOL 200 (H) 05/01/2022   HDL 40  05/01/2022   LDLCALC 110 (H) 05/01/2022   TRIG 291 (H) 05/01/2022   Last hemoglobin A1c Lab Results  Component Value Date   HGBA1C 11.2 (H) 05/01/2022       Objective    BP (!) 131/90 (BP Location: Left Arm, Patient Position: Sitting, Cuff Size: Normal)   Pulse 95   Ht 6' (1.829 m)   Wt 223 lb 11.2 oz (101.5 kg)   SpO2 100%   BMI 30.34 kg/m   BP Readings from Last 3 Encounters:  07/19/22 (!) 131/90  05/16/22 122/77  05/01/22 122/81   Wt Readings from Last 3 Encounters:  07/19/22 223 lb 11.2 oz (101.5 kg)  05/16/22 217 lb (98.4 kg)  05/01/22 227 lb 1.6 oz (103 kg)   SpO2 Readings from Last 3 Encounters:  07/19/22 100%  05/16/22 100%  05/01/22 97%   Physical Exam Vitals and nursing note reviewed.  Constitutional:      Appearance: Normal appearance. He is obese.  HENT:     Head: Normocephalic and atraumatic.  Cardiovascular:     Rate and Rhythm: Normal rate and regular rhythm.     Pulses: Normal pulses.     Heart sounds: Normal heart sounds.  Pulmonary:     Effort: Pulmonary effort is normal.     Breath sounds: Normal breath sounds.  Abdominal:     General: Bowel sounds are normal. There is no distension.     Palpations: Abdomen is soft.     Tenderness: There is no abdominal tenderness. There is no guarding or rebound.  Musculoskeletal:        General: Normal range of motion.     Cervical  back: Normal range of motion.  Skin:    General: Skin is warm and dry.     Capillary Refill: Capillary refill takes less than 2 seconds.  Neurological:     General: No focal deficit present.     Mental Status: He is alert and oriented to person, place, and time. Mental status is at baseline.  Psychiatric:        Mood and Affect: Mood normal.        Behavior: Behavior normal.        Thought Content: Thought content normal.        Judgment: Judgment normal.    Depression Screen    07/19/2022   11:05 AM 05/01/2022   10:25 AM 04/19/2022    2:15 PM 03/21/2022    8:15 AM  PHQ 2/9 Scores  PHQ - 2 Score 0 0 0 0  PHQ- 9 Score 0 0 2 1   No results found for any visits on 07/19/22.  Assessment & Plan      Problem List Items Addressed This Visit       Cardiovascular and Mediastinum   Hypertension associated with diabetes (HCC) - Primary    Chronic, borderline Goal 129/79 or less On lisinopril 20 mg and norvasc 10 mg Could increase lisinopril to 40 mg to assist Repeat CMP      Relevant Orders   Comprehensive Metabolic Panel (CMET)     Endocrine   Hyperlipidemia associated with type 2 diabetes mellitus (HCC)    Chronic, previously elevated at LDL 110 Repeat LP LDL goal of 55-70 Discussed statin start based on repeat LP recommend diet low in saturated fat and regular exercise - 30 min at least 5 times per week       Relevant Orders   Lipid panel   Type 2 diabetes  mellitus with hyperglycemia (HCC)    Chronic, previously uncontrolled Last A1c >11% Repeat A1c at this time given hyperglycemia of 500 on last lab report Continue to recommend balanced, lower carb meals. Smaller meal size, adding snacks. Choosing water as drink of choice and increasing purposeful exercise. Remains on metformin alone d/t cost of Rybelsus Discussed possible additional of glipizide to assist if still elevated       Relevant Orders   Hemoglobin A1c     Other   Encounter to establish care    Return in about 3 months (around 10/19/2022) for T2DM management.    Leilani Merl, FNP, have reviewed all documentation for this visit. The documentation on 07/19/22 for the exam, diagnosis, procedures, and orders are all accurate and complete.  Chase Kindle, FNP  [facility] [phone] (phone) [phone] (fax)  [facility]

## 2022-07-19 ENCOUNTER — Encounter: Payer: Self-pay | Admitting: Family Medicine

## 2022-07-19 ENCOUNTER — Ambulatory Visit: Payer: BC Managed Care – PPO | Admitting: Family Medicine

## 2022-07-19 VITALS — BP 131/90 | HR 95 | Ht 72.0 in | Wt 223.7 lb

## 2022-07-19 DIAGNOSIS — I152 Hypertension secondary to endocrine disorders: Secondary | ICD-10-CM | POA: Diagnosis not present

## 2022-07-19 DIAGNOSIS — Z7689 Persons encountering health services in other specified circumstances: Secondary | ICD-10-CM

## 2022-07-19 DIAGNOSIS — E1159 Type 2 diabetes mellitus with other circulatory complications: Secondary | ICD-10-CM

## 2022-07-19 DIAGNOSIS — E1169 Type 2 diabetes mellitus with other specified complication: Secondary | ICD-10-CM | POA: Insufficient documentation

## 2022-07-19 DIAGNOSIS — E1165 Type 2 diabetes mellitus with hyperglycemia: Secondary | ICD-10-CM | POA: Diagnosis not present

## 2022-07-19 DIAGNOSIS — E785 Hyperlipidemia, unspecified: Secondary | ICD-10-CM

## 2022-07-19 NOTE — Patient Instructions (Signed)
It was a pleasure to meet you; labs will return online within a few days and our team will call you to address any concerning findings or changes recommended for medications.  [name], FNP  [facility] [address] [place], [place] [address] [phone] (phone) [phone] (fax) [facility]

## 2022-07-19 NOTE — Assessment & Plan Note (Signed)
Chronic, previously uncontrolled Last A1c >11% Repeat A1c at this time given hyperglycemia of 500 on last lab report Continue to recommend balanced, lower carb meals. Smaller meal size, adding snacks. Choosing water as drink of choice and increasing purposeful exercise. Remains on metformin alone d/t cost of Rybelsus Discussed possible additional of glipizide to assist if still elevated

## 2022-07-19 NOTE — Assessment & Plan Note (Signed)
Chronic, previously elevated at LDL 110 Repeat LP LDL goal of 55-70 Discussed statin start based on repeat LP recommend diet low in saturated fat and regular exercise - 30 min at least 5 times per week

## 2022-07-19 NOTE — Assessment & Plan Note (Signed)
Chronic, borderline Goal 129/79 or less On lisinopril 20 mg and norvasc 10 mg Could increase lisinopril to 40 mg to assist Repeat CMP

## 2022-07-20 LAB — LIPID PANEL
Chol/HDL Ratio: 5 ratio (ref 0.0–5.0)
Cholesterol, Total: 195 mg/dL (ref 100–199)
HDL: 39 mg/dL — ABNORMAL LOW (ref >39)
LDL Chol Calc (NIH): 118 mg/dL — ABNORMAL HIGH (ref 0–99)
Triglycerides: 218 mg/dL — ABNORMAL HIGH (ref 0–149)
VLDL Cholesterol Cal: 38 mg/dL (ref 5–40)

## 2022-07-20 LAB — COMPREHENSIVE METABOLIC PANEL
ALT: 22 IU/L (ref 0–44)
AST: 23 IU/L (ref 0–40)
Albumin/Globulin Ratio: 1.7 (ref 1.2–2.2)
Albumin: 4.3 g/dL (ref 3.8–4.9)
Alkaline Phosphatase: 148 IU/L — ABNORMAL HIGH (ref 44–121)
BUN/Creatinine Ratio: 14 (ref 9–20)
BUN: 11 mg/dL (ref 6–24)
Bilirubin Total: 0.6 mg/dL (ref 0.0–1.2)
CO2: 23 mmol/L (ref 20–29)
Calcium: 9.7 mg/dL (ref 8.7–10.2)
Chloride: 98 mmol/L (ref 96–106)
Creatinine, Ser: 0.79 mg/dL (ref 0.76–1.27)
Globulin, Total: 2.5 g/dL (ref 1.5–4.5)
Glucose: 216 mg/dL — ABNORMAL HIGH (ref 70–99)
Potassium: 3.4 mmol/L — ABNORMAL LOW (ref 3.5–5.2)
Sodium: 137 mmol/L (ref 134–144)
Total Protein: 6.8 g/dL (ref 6.0–8.5)
eGFR: 106 mL/min/{1.73_m2} (ref >59)

## 2022-07-20 LAB — HEMOGLOBIN A1C
Est. average glucose Bld gHb Est-mCnc: 217 mg/dL
Hgb A1c MFr Bld: 9.2 % — ABNORMAL HIGH (ref 4.8–5.6)

## 2022-07-21 ENCOUNTER — Other Ambulatory Visit: Payer: Self-pay | Admitting: Family Medicine

## 2022-07-21 MED ORDER — GLIPIZIDE 10 MG PO TABS: 10.0000 mg | ORAL_TABLET | Freq: Two times a day (BID) | ORAL | 3 refills | Status: DC

## 2022-07-21 MED ORDER — ROSUVASTATIN CALCIUM 20 MG PO TABS: 20.0000 mg | ORAL_TABLET | Freq: Every day | ORAL | 3 refills | Status: DC

## 2022-07-21 NOTE — Progress Notes (Signed)
A1c has improved; however, remains elevated above goal of <8%. Ideally A1c should be <7% for best outcomes. Would recommend additional of glipzide to assist. LDL goal is <55; currently double. Recommend start of statin to assist The 10-year ASCVD risk score (Arnett DK, et al., 2019) is: 33.6% Liver enzyme remains stable; borderline low potassium.

## 2022-08-01 ENCOUNTER — Ambulatory Visit: Payer: BC Managed Care – PPO | Admitting: Family Medicine

## 2022-08-20 DIAGNOSIS — M25561 Pain in right knee: Secondary | ICD-10-CM | POA: Diagnosis not present

## 2022-08-20 DIAGNOSIS — M1000 Idiopathic gout, unspecified site: Secondary | ICD-10-CM | POA: Diagnosis not present

## 2022-08-31 ENCOUNTER — Other Ambulatory Visit: Payer: Self-pay | Admitting: Family Medicine

## 2022-08-31 MED ORDER — ALLOPURINOL 100 MG PO TABS: 200.0000 mg | ORAL_TABLET | Freq: Every day | ORAL | 0 refills | Status: DC

## 2022-08-31 NOTE — Telephone Encounter (Signed)
Requested Prescriptions  Pending Prescriptions Disp Refills   allopurinol (ZYLOPRIM) 100 MG tablet 180 tablet 0    Sig: Take 2 tablets (200 mg total) by mouth daily.     Endocrinology:  Gout Agents - allopurinol Passed - 08/31/2022  1:17 PM      Passed - Uric Acid in normal range and within 360 days    Uric Acid  Date Value Ref Range Status  03/21/2022 5.5 3.8 - 8.4 mg/dL Final    Comment:               Therapeutic target for gout patients: <6.0         Passed - Cr in normal range and within 360 days    Creatinine, Ser  Date Value Ref Range Status  07/19/2022 0.79 0.76 - 1.27 mg/dL Final         Passed - Valid encounter within last 12 months    Recent Outpatient Visits           1 month ago Hypertension associated with diabetes [facility])   [place] [facility] Merita Norton T, FNP   4 months ago Routine general medical examination at a health care facility   [facility], Megan P, DO   4 months ago Primary hypertension   [place] [facility] Larae Grooms, NP   5 months ago Acute non-recurrent maxillary sinusitis   [place] [facility] [place], Megan P, DO   11 months ago Type 2 diabetes mellitus with hyperglycemia, without long-term current use of insulin (HCC)   [place] [facility] Brady, [place], DO       Future Appointments             In 1 month [name], FNP  [facility]            Passed - CBC within normal limits and completed in the last 12 months    WBC  Date Value Ref Range Status  05/01/2022 9.6 3.4 - 10.8 x10E3/uL Final  07/28/2020 10.8 (H) 4.0 - 10.5 K/uL Final   RBC  Date Value Ref Range Status  05/01/2022 4.71 4.14 - 5.80 x10E6/uL Final  07/28/2020 4.60 4.22 - 5.81 MIL/uL Final   Hemoglobin  Date Value Ref Range Status  05/01/2022 15.4 13.0 - 17.7 g/dL Final   Hematocrit  Date Value Ref Range Status   05/01/2022 44.2 37.5 - 51.0 % Final   MCHC  Date Value Ref Range Status  05/01/2022 34.8 31.5 - 35.7 g/dL Final  16/11/9602 54.0 30.0 - 36.0 g/dL Final   [facility]  Date Value Ref Range Status  05/01/2022 32.7 26.6 - 33.0 pg Final  07/28/2020 31.5 26.0 - 34.0 pg Final   MCV  Date Value Ref Range Status  05/01/2022 94 79 - 97 fL Final   No results found for: "PLTCOUNTKUC", "LABPLAT", "POCPLA" RDW  Date Value Ref Range Status  05/01/2022 11.5 (L) 11.6 - 15.4 % Final

## 2022-08-31 NOTE — Telephone Encounter (Signed)
Medication Refill - Medication: allopurinol (ZYLOPRIM) 100 MG tablet   Has the patient contacted their pharmacy? No. (Agent: If no, request that the patient contact the pharmacy for the refill. If patient does not wish to contact the pharmacy document the reason why and proceed with request.) (Agent: If yes, when and what did the pharmacy advise?)  Preferred Pharmacy (with phone number or street name):  [id number] - [place], [place] - [address] Phone: [phone]  Fax: [phone]     Has the patient been seen for an appointment in the last year OR does the patient have an upcoming appointment? Yes.    Agent: Please be advised that RX refills may take up to 3 business days. We ask that you follow-up with your pharmacy.  

## 2022-09-16 ENCOUNTER — Other Ambulatory Visit: Payer: Self-pay | Admitting: Family Medicine

## 2022-09-17 ENCOUNTER — Other Ambulatory Visit: Payer: Self-pay | Admitting: Family Medicine

## 2022-09-17 NOTE — Telephone Encounter (Signed)
Requested Prescriptions  Pending Prescriptions Disp Refills   metFORMIN (GLUCOPHAGE-XR) 500 MG 24 hr tablet [Pharmacy Med Name: METFORMIN HCL ER 500 MG TABLET] 360 tablet 0    Sig: TAKE 2 TABLETS BY MOUTH TWICE A DAY     Endocrinology:  Diabetes - Biguanides Failed - 09/16/2022  1:51 AM      Failed - HBA1C is between 0 and 7.9 and within 180 days    Hemoglobin A1C  Date Value Ref Range Status  07/25/2017 5.8  Final   HB A1C (BAYER DCA - WAIVED)  Date Value Ref Range Status  05/01/2022 11.4 (H) 4.8 - 5.6 % Final    Comment:             Prediabetes: 5.7 - 6.4          Diabetes: >6.4          Glycemic control for adults with diabetes: <7.0    Hgb A1c MFr Bld  Date Value Ref Range Status  07/19/2022 9.2 (H) 4.8 - 5.6 % Final    Comment:             Prediabetes: 5.7 - 6.4          Diabetes: >6.4          Glycemic control for adults with diabetes: <7.0          Failed - B12 Level in normal range and within 720 days    No results found for: "VITAMINB12"       Passed - Cr in normal range and within 360 days    Creatinine, Ser  Date Value Ref Range Status  07/19/2022 0.79 0.76 - 1.27 mg/dL Final         Passed - eGFR in normal range and within 360 days    GFR calc Af Amer  Date Value Ref Range Status  04/22/2020 96 >59 mL/min/1.73 Final    Comment:    **In accordance with recommendations from the NKF-ASN Task force,**   [facility] is in the process of updating its eGFR calculation to the   2021 CKD-EPI creatinine equation that estimates kidney function   without a race variable.    GFR, Estimated  Date Value Ref Range Status  07/28/2020 >60 >60 mL/min Final    Comment:    (NOTE) Calculated using the CKD-EPI Creatinine Equation (2021)    eGFR  Date Value Ref Range Status  07/19/2022 106 >59 mL/min/1.73 Final         Passed - Valid encounter within last 6 months    Recent Outpatient Visits           2 months ago Hypertension associated with diabetes [facility])   [facility] Merita Norton T, FNP   4 months ago Routine general medical examination at a health care facility   [facility], Megan P, DO   5 months ago Primary hypertension   [facility] [place], Clydie Braun, NP   6 months ago Acute non-recurrent maxillary sinusitis   [place] [facility] [place], Megan P, DO   11 months ago Type 2 diabetes mellitus with hyperglycemia, without long-term current use of insulin [facility])   [facility] [place], Oralia Rud, DO       Future Appointments             In 1 month Suzie Portela, Daryl Eastern, FNP [facility], [facility]  Passed - CBC within normal limits and completed in the last 12 months    WBC  Date Value Ref Range Status  05/01/2022 9.6 3.4 - 10.8 x10E3/uL Final  07/28/2020 10.8 (H) 4.0 - 10.5 K/uL Final   RBC  Date Value Ref Range Status  05/01/2022 4.71 4.14 - 5.80 x10E6/uL Final  07/28/2020 4.60 4.22 - 5.81 MIL/uL Final   Hemoglobin  Date Value Ref Range Status  05/01/2022 15.4 13.0 - 17.7 g/dL Final   Hematocrit  Date Value Ref Range Status  05/01/2022 44.2 37.5 - 51.0 % Final   MCHC  Date Value Ref Range Status  05/01/2022 34.8 31.5 - 35.7 g/dL Final  16/11/9602 54.0 30.0 - 36.0 g/dL Final   [facility]  Date Value Ref Range Status  05/01/2022 32.7 26.6 - 33.0 pg Final  07/28/2020 31.5 26.0 - 34.0 pg Final   MCV  Date Value Ref Range Status  05/01/2022 94 79 - 97 fL Final   No results found for: "PLTCOUNTKUC", "LABPLAT", "POCPLA" RDW  Date Value Ref Range Status  05/01/2022 11.5 (L) 11.6 - 15.4 % Final

## 2022-09-18 NOTE — Telephone Encounter (Signed)
Request for allopurinol is too soon.  Requested Prescriptions  Pending Prescriptions Disp Refills   lisinopril (ZESTRIL) 20 MG tablet [Pharmacy Med Name: LISINOPRIL 20 MG TABLET] 90 tablet 0    Sig: TAKE 1 TABLET BY MOUTH EVERY DAY     Cardiovascular:  ACE Inhibitors Failed - 09/17/2022  2:26 AM      Failed - K in normal range and within 180 days    Potassium  Date Value Ref Range Status  07/19/2022 3.4 (L) 3.5 - 5.2 mmol/L Final         Failed - Last BP in normal range    BP Readings from Last 1 Encounters:  07/19/22 (!) 131/90         Passed - Cr in normal range and within 180 days    Creatinine, Ser  Date Value Ref Range Status  07/19/2022 0.79 0.76 - 1.27 mg/dL Final         Passed - Patient is not pregnant      Passed - Valid encounter within last 6 months    Recent Outpatient Visits           2 months ago Hypertension associated with diabetes [facility])   [facility] Norton T, FNP   4 months ago Routine general medical examination at a health care facility   [facility], Megan P, DO   5 months ago Primary hypertension   [facility] Larae Grooms, NP   6 months ago Acute non-recurrent maxillary sinusitis   [facility] [place] [place], Megan P, DO   11 months ago Type 2 diabetes mellitus with hyperglycemia, without long-term current use of insulin (HCC)   [facility] [place], Milo, DO       Future Appointments             In 1 month [name], FNP [facility], [facility]             allopurinol (ZYLOPRIM) 100 MG tablet [Pharmacy Med Name: ALLOPURINOL 100 MG TABLET] 180 tablet 1    Sig: TAKE 2 TABLETS BY MOUTH EVERY DAY     Endocrinology:  Gout Agents - allopurinol Passed - 09/17/2022  2:26 AM      Passed - Uric Acid in normal range and within 360 days    Uric Acid  Date Value Ref Range Status   03/21/2022 5.5 3.8 - 8.4 mg/dL Final    Comment:               Therapeutic target for gout patients: <6.0         Passed - Cr in normal range and within 360 days    Creatinine, Ser  Date Value Ref Range Status  07/19/2022 0.79 0.76 - 1.27 mg/dL Final         Passed - Valid encounter within last 12 months    Recent Outpatient Visits           2 months ago Hypertension associated with diabetes [facility])   [facility] [name], FNP   4 months ago Routine general medical examination at a health care facility   [facility] [place], Megan P, DO   5 months ago Primary hypertension   [place] [facility] Larae Grooms, NP   6 months ago Acute non-recurrent maxillary sinusitis   [place] [facility] [place], Megan P, DO   11 months ago Type 2 diabetes mellitus with hyperglycemia, without long-term current use of insulin (HCC)   [place] [facility] [place], [place], DO       Future Appointments             In 1 month [name], FNP [facility], [facility]            Passed - CBC within normal limits and completed in the last 12 months    WBC  Date Value Ref Range Status  05/01/2022 9.6 3.4 - 10.8 x10E3/uL Final  07/28/2020 10.8 (H) 4.0 - 10.5 K/uL Final   RBC  Date Value Ref Range Status  05/01/2022 4.71 4.14 - 5.80 x10E6/uL Final  07/28/2020 4.60 4.22 - 5.81 MIL/uL Final   Hemoglobin  Date Value Ref Range Status  05/01/2022 15.4 13.0 - 17.7 g/dL Final   Hematocrit  Date Value Ref Range Status  05/01/2022 44.2 37.5 - 51.0 % Final   MCHC  Date Value Ref Range Status  05/01/2022 34.8 31.5 - 35.7 g/dL Final  47/82/9562 13.0 30.0 - 36.0 g/dL Final   [facility]  Date Value Ref Range Status  05/01/2022 32.7 26.6 - 33.0 pg Final  07/28/2020 31.5 26.0 - 34.0 pg Final   MCV  Date Value Ref Range Status  05/01/2022 94 79 - 97 fL Final   No  results found for: "PLTCOUNTKUC", "LABPLAT", "POCPLA" RDW  Date Value Ref Range Status  05/01/2022 11.5 (L) 11.6 - 15.4 % Final          amLODipine (NORVASC) 10 MG tablet [Pharmacy Med Name: AMLODIPINE BESYLATE 10 MG TAB] 90 tablet 0    Sig: TAKE 1 TABLET BY MOUTH EVERY DAY     Cardiovascular: Calcium Channel Blockers 2 Failed - 09/17/2022  2:26 AM      Failed - Last BP in normal range    BP Readings from Last 1 Encounters:  07/19/22 (!) 131/90         Passed - Last Heart Rate in normal range    Pulse Readings from Last 1 Encounters:  07/19/22 95         Passed - Valid encounter within last 6 months    Recent Outpatient Visits           2 months ago Hypertension associated with diabetes [facility])   [facility] [name], FNP   4 months ago Routine general medical examination at a health care facility   [facility] [place], Megan P, DO   5 months ago Primary hypertension   [facility] [facility] Larae Grooms, NP   6 months ago Acute non-recurrent maxillary sinusitis   [place] [place], Megan P, DO   11 months ago Type 2 diabetes mellitus with hyperglycemia, without long-term current use of insulin [facility])   [facility] Dorcas [place]arrow, DO       Future Appointments             In 1 month [name], FNP [facility], [facility]

## 2022-10-16 ENCOUNTER — Other Ambulatory Visit: Payer: Self-pay | Admitting: Family Medicine

## 2022-10-17 ENCOUNTER — Encounter: Payer: Self-pay | Admitting: Family Medicine

## 2022-10-17 ENCOUNTER — Ambulatory Visit (INDEPENDENT_AMBULATORY_CARE_PROVIDER_SITE_OTHER): Payer: BC Managed Care – PPO | Admitting: Family Medicine

## 2022-10-17 VITALS — BP 148/100 | HR 86 | Ht 72.0 in | Wt 235.1 lb

## 2022-10-17 DIAGNOSIS — M48061 Spinal stenosis, lumbar region without neurogenic claudication: Secondary | ICD-10-CM

## 2022-10-17 DIAGNOSIS — E1169 Type 2 diabetes mellitus with other specified complication: Secondary | ICD-10-CM

## 2022-10-17 DIAGNOSIS — E1165 Type 2 diabetes mellitus with hyperglycemia: Secondary | ICD-10-CM

## 2022-10-17 DIAGNOSIS — M1A0690 Idiopathic chronic gout, unspecified knee, without tophus (tophi): Secondary | ICD-10-CM

## 2022-10-17 DIAGNOSIS — E1159 Type 2 diabetes mellitus with other circulatory complications: Secondary | ICD-10-CM

## 2022-10-17 DIAGNOSIS — E785 Hyperlipidemia, unspecified: Secondary | ICD-10-CM

## 2022-10-17 DIAGNOSIS — I152 Hypertension secondary to endocrine disorders: Secondary | ICD-10-CM

## 2022-10-17 LAB — POCT GLYCOSYLATED HEMOGLOBIN (HGB A1C): Hemoglobin A1C: 6.8 % — AB (ref 4.0–5.6)

## 2022-10-17 MED ORDER — GLIPIZIDE 10 MG PO TABS: 10.0000 mg | ORAL_TABLET | Freq: Every day | ORAL | 3 refills | Status: DC

## 2022-10-17 MED ORDER — TADALAFIL 20 MG PO TABS: 20.0000 mg | ORAL_TABLET | Freq: Every day | ORAL | 3 refills | Status: DC

## 2022-10-17 MED ORDER — ALLOPURINOL 200 MG PO TABS: 200.0000 mg | ORAL_TABLET | Freq: Every day | ORAL | 3 refills | Status: DC

## 2022-10-17 MED ORDER — METFORMIN HCL ER 500 MG PO TB24: 1000.0000 mg | ORAL_TABLET | Freq: Two times a day (BID) | ORAL | 3 refills | Status: DC

## 2022-10-17 MED ORDER — LISINOPRIL 40 MG PO TABS: 40.0000 mg | ORAL_TABLET | Freq: Every day | ORAL | 3 refills | Status: DC

## 2022-10-17 MED ORDER — AMLODIPINE BESYLATE 10 MG PO TABS: 10.0000 mg | ORAL_TABLET | Freq: Every day | ORAL | 3 refills | Status: DC

## 2022-10-17 NOTE — Assessment & Plan Note (Signed)
Chronic, with recent flare Likely iso of imobility and weight gain Exercise provided Pt has throat swelling iso gabapentin use Continue to recommend conservative therapies at this time

## 2022-10-17 NOTE — Assessment & Plan Note (Signed)
Chronic, previously elevated recommend diet low in saturated fat and regular exercise - 30 min at least 5 times per week Defer LP for an additional 3 months LDL goal of <70 Continue Crestor 20

## 2022-10-17 NOTE — Progress Notes (Signed)
Established patient visit   Patient: Chase Hull   DOB: 13-Nov-1965   57 y.o. Male  MRN: [id number] Visit Date: 10/17/2022  Today's healthcare provider: [name], FNP  Introduced to nurse practitioner role and practice setting.  All questions answered.  Discussed provider/patient relationship and expectations.  Subjective    HPI  Patient reports 1 low BG in high 40's; reports he felt "incoherent"   Medications: Outpatient Medications Prior to Visit  Medication Sig   aspirin 81 MG tablet Take 81 mg by mouth daily.   Aspirin-Salicylamide-Caffeine (BC HEADACHE PO) Take 1 packet by mouth daily as needed (headaches).   Blood Glucose Monitoring Suppl (FIFTY50 GLUCOSE METER 2.0) w/Device KIT Use to check fasting blood sugars daily   Blood Glucose Monitoring Suppl (ONE TOUCH ULTRA 2) w/Device KIT 1 each by Does not apply route daily.   fluticasone (FLONASE) 50 MCG/ACT nasal spray Place 2 sprays into both nostrils daily.   glucose blood (ONETOUCH ULTRA) test strip 1 each by Other route daily.   Multiple Vitamin (MULTIVITAMIN) tablet Take 1 tablet by mouth daily.   OneTouch Delica Lancets 33G MISC 1 each by Does not apply route daily.   rosuvastatin (CRESTOR) 20 MG tablet Take 1 tablet (20 mg total) by mouth daily.   [DISCONTINUED] allopurinol (ZYLOPRIM) 100 MG tablet Take 2 tablets (200 mg total) by mouth daily.   [DISCONTINUED] amLODipine (NORVASC) 10 MG tablet TAKE 1 TABLET BY MOUTH EVERY DAY   [DISCONTINUED] glipiZIDE (GLUCOTROL) 10 MG tablet TAKE 1 TABLET (10 MG TOTAL) BY MOUTH TWICE A DAY BEFORE A MEAL   [DISCONTINUED] lisinopril (ZESTRIL) 20 MG tablet TAKE 1 TABLET BY MOUTH EVERY DAY   [DISCONTINUED] metFORMIN (GLUCOPHAGE-XR) 500 MG 24 hr tablet TAKE 2 TABLETS BY MOUTH TWICE A DAY   [DISCONTINUED] tadalafil (CIALIS) 20 MG tablet TAKE 1/2 TO 1 TABLET (10-20 MG TOTAL) BY MOUTH EVERY OTHER DAY AS NEEDED FOR ERECTILE DYSFUNCTION   No facility-administered medications prior to  visit.    Review of Systems  Last CBC Lab Results  Component Value Date   WBC 9.6 05/01/2022   HGB 15.4 05/01/2022   HCT 44.2 05/01/2022   MCV 94 05/01/2022   MCH 32.7 05/01/2022   RDW 11.5 (L) 05/01/2022   PLT 235 05/01/2022   Last metabolic panel Lab Results  Component Value Date   GLUCOSE 216 (H) 07/19/2022   NA 137 07/19/2022   K 3.4 (L) 07/19/2022   CL 98 07/19/2022   CO2 23 07/19/2022   BUN 11 07/19/2022   CREATININE 0.79 07/19/2022   EGFR 106 07/19/2022   CALCIUM 9.7 07/19/2022   PROT 6.8 07/19/2022   ALBUMIN 4.3 07/19/2022   LABGLOB 2.5 07/19/2022   AGRATIO 1.7 07/19/2022   BILITOT 0.6 07/19/2022   ALKPHOS 148 (H) 07/19/2022   AST 23 07/19/2022   ALT 22 07/19/2022   ANIONGAP 10 07/28/2020   Last lipids Lab Results  Component Value Date   CHOL 195 07/19/2022   HDL 39 (L) 07/19/2022   LDLCALC 118 (H) 07/19/2022   TRIG 218 (H) 07/19/2022   CHOLHDL 5.0 07/19/2022   Last hemoglobin A1c Lab Results  Component Value Date   HGBA1C 6.8 (A) 10/17/2022     Objective    BP (!) 148/100 (BP Location: Left Arm, Patient Position: Sitting, Cuff Size: Normal)   Pulse 86   Ht 6' (1.829 m)   Wt 235 lb 1.6 oz (106.6 kg)   SpO2 100%   BMI  31.89 kg/m   BP Readings from Last 3 Encounters:  10/17/22 (!) 148/100  07/19/22 (!) 131/90  05/16/22 122/77   Wt Readings from Last 3 Encounters:  10/17/22 235 lb 1.6 oz (106.6 kg)  07/19/22 223 lb 11.2 oz (101.5 kg)  05/16/22 217 lb (98.4 kg)   Physical Exam Vitals and nursing note reviewed.  Constitutional:      Appearance: Normal appearance. He is obese.  HENT:     Head: Normocephalic and atraumatic.  Eyes:     Pupils: Pupils are equal, round, and reactive to light.  Cardiovascular:     Rate and Rhythm: Normal rate and regular rhythm.     Pulses: Normal pulses.     Heart sounds: Normal heart sounds.  Pulmonary:     Effort: Pulmonary effort is normal.     Breath sounds: Normal breath sounds.   Musculoskeletal:        General: Normal range of motion.     Cervical back: Normal range of motion.  Skin:    General: Skin is warm and dry.     Capillary Refill: Capillary refill takes less than 2 seconds.  Neurological:     General: No focal deficit present.     Mental Status: He is alert and oriented to person, place, and time. Mental status is at baseline.    Results for orders placed or performed in visit on 10/17/22  POCT HgB A1C  Result Value Ref Range   Hemoglobin A1C 6.8 (A) 4.0 - 5.6 %   HbA1c POC (<> result, manual entry)     HbA1c, POC (prediabetic range)     HbA1c, POC (controlled diabetic range)      Assessment & Plan     Problem List Items Addressed This Visit       Cardiovascular and Mediastinum   Hypertension associated with diabetes (HCC)    Chronic, remains uncontrolled However, pt has also gained 12 lbs in 3 months Recommend increase of lisinopril form 20 to 40 mg daily with continued use of norvasc at 10 mg Goal of 129/79 or less Defer CBC, CMP, TSH at this time       Relevant Medications   amLODipine (NORVASC) 10 MG tablet   glipiZIDE (GLUCOTROL) 10 MG tablet   lisinopril (ZESTRIL) 40 MG tablet   metFORMIN (GLUCOPHAGE-XR) 500 MG 24 hr tablet   tadalafil (CIALIS) 20 MG tablet     Endocrine   Hyperlipidemia associated with type 2 diabetes mellitus (HCC)    Chronic, previously elevated recommend diet low in saturated fat and regular exercise - 30 min at least 5 times per week Defer LP for an additional 3 months LDL goal of <70 Continue Crestor 20      Relevant Medications   amLODipine (NORVASC) 10 MG tablet   glipiZIDE (GLUCOTROL) 10 MG tablet   lisinopril (ZESTRIL) 40 MG tablet   metFORMIN (GLUCOPHAGE-XR) 500 MG 24 hr tablet   tadalafil (CIALIS) 20 MG tablet   Type 2 diabetes mellitus with hyperglycemia (HCC) - Primary    Chronic, improved A1c at goal of <7% Continue to recommend balanced, lower carb meals. Smaller meal size, adding  snacks. Choosing water as drink of choice and increasing purposeful exercise. 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     Leilani Merl, FNP, have reviewed all documentation for this visit. The documentation on 10/17/22 for the exam, diagnosis, procedures, and orders are all accurate and complete.  [name], FNP  [facility] [phone] (phone) [phone] (fax)  [facility]

## 2022-10-17 NOTE — Assessment & Plan Note (Signed)
Chronic, improved A1c at goal of <7% Continue to recommend balanced, lower carb meals. Smaller meal size, adding snacks. Choosing water as drink of choice and increasing purposeful exercise. Reduce glipizide from 10 mg BID to 10 mg with largest meal Continue 1000 mg Metformin XR BID 3 month labs recommended

## 2022-10-17 NOTE — Assessment & Plan Note (Signed)
Chronic, remains uncontrolled However, pt has also gained 12 lbs in 3 months Recommend increase of lisinopril form 20 to 40 mg daily with continued use of norvasc at 10 mg Goal of 129/79 or less Defer CBC, CMP, TSH at this time

## 2022-10-17 NOTE — Assessment & Plan Note (Signed)
Chronic, bilateral, stable Request for refills of allopurinol Will increase from 2-100s to 1-200 to assist in getting 90# supply

## 2022-10-19 ENCOUNTER — Ambulatory Visit: Payer: BC Managed Care – PPO | Admitting: Family Medicine

## 2022-11-14 DIAGNOSIS — M5430 Sciatica, unspecified side: Secondary | ICD-10-CM | POA: Diagnosis not present

## 2022-11-14 DIAGNOSIS — M5136 Other intervertebral disc degeneration, lumbar region: Secondary | ICD-10-CM | POA: Diagnosis not present

## 2022-11-14 DIAGNOSIS — M5137 Other intervertebral disc degeneration, lumbosacral region: Secondary | ICD-10-CM | POA: Diagnosis not present

## 2022-11-14 DIAGNOSIS — M4316 Spondylolisthesis, lumbar region: Secondary | ICD-10-CM | POA: Diagnosis not present

## 2022-11-17 ENCOUNTER — Other Ambulatory Visit: Payer: Self-pay | Admitting: Family Medicine

## 2022-11-19 ENCOUNTER — Telehealth: Payer: Self-pay | Admitting: Family Medicine

## 2022-11-19 NOTE — Telephone Encounter (Signed)
Requested Prescriptions  Pending Prescriptions Disp Refills   allopurinol (ZYLOPRIM) 100 MG tablet [Pharmacy Med Name: ALLOPURINOL 100 MG TABLET] 180 tablet 0    Sig: TAKE 2 TABLETS BY MOUTH EVERY DAY     Endocrinology:  Gout Agents - allopurinol Passed - 11/17/2022  1:01 AM      Passed - Uric Acid in normal range and within 360 days    Uric Acid  Date Value Ref Range Status  03/21/2022 5.5 3.8 - 8.4 mg/dL Final    Comment:               Therapeutic target for gout patients: <6.0         Passed - Cr in normal range and within 360 days    Creatinine, Ser  Date Value Ref Range Status  07/19/2022 0.79 0.76 - 1.27 mg/dL Final         Passed - Valid encounter within last 12 months    Recent Outpatient Visits           1 month ago Type 2 diabetes mellitus with hyperglycemia, without long-term current use of insulin (HCC)   [place] [facility] Merita Norton T, FNP   4 months ago Hypertension associated with diabetes [facility])   [place] [facility] Merita Norton T, FNP   6 months ago Routine general medical examination at a health care facility   [facility], Megan [facility], DO   7 months ago Primary hypertension   [place] [facility] Larae Grooms, NP   8 months ago Acute non-recurrent maxillary sinusitis   [place] [facility] [place], Oralia Rud, DO       Future Appointments             In 1 month [name], FNP [place] [facility], [facility]            Passed - CBC within normal limits and completed in the last 12 months    WBC  Date Value Ref Range Status  05/01/2022 9.6 3.4 - 10.8 x10E3/uL Final  07/28/2020 10.8 (H) 4.0 - 10.5 K/uL Final   RBC  Date Value Ref Range Status  05/01/2022 4.71 4.14 - 5.80 x10E6/uL Final  07/28/2020 4.60 4.22 - 5.81 MIL/uL Final   Hemoglobin  Date Value Ref Range Status  05/01/2022 15.4 13.0 - 17.7 g/dL Final   Hematocrit   Date Value Ref Range Status  05/01/2022 44.2 37.5 - 51.0 % Final   MCHC  Date Value Ref Range Status  05/01/2022 34.8 31.5 - 35.7 g/dL Final  16/11/9602 54.0 30.0 - 36.0 g/dL Final   [facility]  Date Value Ref Range Status  05/01/2022 32.7 26.6 - 33.0 pg Final  07/28/2020 31.5 26.0 - 34.0 pg Final   MCV  Date Value Ref Range Status  05/01/2022 94 79 - 97 fL Final   No results found for: "PLTCOUNTKUC", "LABPLAT", "POCPLA" RDW  Date Value Ref Range Status  05/01/2022 11.5 (L) 11.6 - 15.4 % Final

## 2022-11-19 NOTE — Telephone Encounter (Signed)
Medication: allopurinol 200 MG TABS too expensive  ($500.00)    patient he did not pick up from pharmacy Would like to go back to old rx  100mg   and take twice a day    [facility] [address] [place] - [address] [place], [place] [address] Phone: [phone]  Fax: [phone] DEA #: [id number]

## 2022-11-20 ENCOUNTER — Other Ambulatory Visit: Payer: Self-pay | Admitting: Family Medicine

## 2022-11-22 ENCOUNTER — Inpatient Hospital Stay
Admission: RE | Admit: 2022-11-22 | Discharge: 2022-11-22 | Disposition: A | Discharge status: Home / Self Care | Payer: Self-pay | Source: Ambulatory Visit | Attending: Orthopedic Surgery | Admitting: Orthopedic Surgery

## 2022-11-22 ENCOUNTER — Other Ambulatory Visit: Payer: Self-pay | Admitting: Family Medicine

## 2022-11-22 DIAGNOSIS — Z049 Encounter for examination and observation for unspecified reason: Secondary | ICD-10-CM

## 2022-11-23 NOTE — Progress Notes (Unsigned)
Referring Physician:  No referring provider defined for this encounter.  Primary Physician:  [name], FNP  History of Present Illness: 11/26/2022 [name] has a history of DM, HTN,   He is s/p L3-L4 and L5-S1 decompression with right L4-L5 microdiscectomy on 11/03/20 by Dr. Myer Haff.   Seen by PCP on 11/14/22 with left > right thigh pain that started two days prior. 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   He has intermittent LBP but primary complaint in constant bilateral anterior thigh pain that started almost 2 weeks ago. Left leg > right leg pain. Leg pain > LBP. He has numbness and tingling in his legs. No weakness. No alleviating or aggravating factors.   He was given prednisone and flexeril by his PCP- this did not help.   Bowel/Bladder Dysfunction: none  He smokes about 10 cigarettes a day x 25 years.   Conservative measures:  Physical therapy: has not participated in   Multimodal medical therapy including regular antiinflammatories: norco, neurontin, prednisone, flexeril  Injections: has not received epidural steroid injections  Past Surgery:  L3-L4 and L5-S1 decompression with right L4-L5 microdiscectomy on 11/03/20 by Dr. Molli Knock has no symptoms of cervical myelopathy.  The symptoms are causing a significant impact on the patient's life.   Review of Systems:  A 10 point review of systems is negative, except for the pertinent positives and negatives detailed in the HPI.  Past Medical History: Past Medical History:  Diagnosis Date   Allergy    Carpal tunnel syndrome    Diabetes mellitus without complication (HCC)    no meds, A1C down   Gout    Hypertension     Past Surgical History: Past Surgical History:  Procedure Laterality Date   COLONOSCOPY WITH PROPOFOL N/A 03/30/2019   Procedure: COLONOSCOPY WITH BIOPSY;  Surgeon: Wyline Mood, MD;  Location: [facility];  Service: Endoscopy;   Laterality: N/A;  Priority 4   COLONOSCOPY WITH PROPOFOL N/A 05/16/2022   Procedure: COLONOSCOPY WITH PROPOFOL;  Surgeon: Wyline Mood, MD;  Location: [facility] END[facility];  Service: Gastroenterology;  Laterality: N/A;   FRACTURE SURGERY     left ankle    Left hand surgery  03/2018   Carpal tunnel   LUMBAR LAMINECTOMY/DECOMPRESSION MICRODISCECTOMY N/A 08/03/2020   Procedure: L3-4 AND L5-S1 DECOMPRESSION, RIGHT L4-5 MICRODISCECTOMY;  Surgeon: Venetia Night, MD;  Location: [facility] ORS;  Service: Neurosurgery;  Laterality: N/A;   POLYPECTOMY N/A 03/30/2019   Procedure: POLYPECTOMY;  Surgeon: Wyline Mood, MD;  Location: [facility];  Service: Endoscopy;  Laterality: N/A;   SPINE SURGERY     TONSILLECTOMY     adniods   TOOTH EXTRACTION      Allergies: Allergies as of 11/26/2022 - Review Complete 11/26/2022  Allergen Reaction Noted   Gabapentin Anaphylaxis 10/21/2019    Medications: Outpatient Encounter Medications as of 11/26/2022  Medication Sig   allopurinol (ZYLOPRIM) 100 MG tablet TAKE 2 TABLETS BY MOUTH EVERY DAY   amLODipine (NORVASC) 10 MG tablet Take 1 tablet (10 mg total) by mouth daily.   aspirin 81 MG tablet Take 81 mg by mouth daily.   Aspirin-Salicylamide-Caffeine (BC HEADACHE PO) Take 1 packet by mouth daily as needed (headaches).   Blood Glucose Monitoring Suppl (FIFTY50 GLUCOSE METER 2.0) w/Device KIT Use to check fasting blood sugars daily   Blood Glucose Monitoring Suppl (ONE TOUCH ULTRA 2) w/Device KIT 1 each by Does not apply route daily.  glipiZIDE (GLUCOTROL) 10 MG tablet Take 1 tablet (10 mg total) by mouth daily. Take before largest meal   glucose blood (ONETOUCH ULTRA) test strip 1 each by Other route daily.   lisinopril (ZESTRIL) 40 MG tablet Take 1 tablet (40 mg total) by mouth daily.   metFORMIN (GLUCOPHAGE-XR) 500 MG 24 hr tablet Take 2 tablets (1,000 mg total) by mouth 2 (two) times daily with a meal.   Multiple Vitamin (MULTIVITAMIN) tablet Take 1  tablet by mouth daily.   OneTouch Delica Lancets 33G MISC 1 each by Does not apply route daily.   rosuvastatin (CRESTOR) 20 MG tablet Take 1 tablet (20 mg total) by mouth daily.   tadalafil (CIALIS) 20 MG tablet Take 1 tablet (20 mg total) by mouth daily.   [DISCONTINUED] allopurinol 200 MG TABS Take 200 mg by mouth daily.   [DISCONTINUED] fluticasone (FLONASE) 50 MCG/ACT nasal spray Place 2 sprays into both nostrils daily. (Patient not taking: Reported on 11/26/2022)   No facility-administered encounter medications on file as of 11/26/2022.    Social History: Social History   Tobacco Use   Smoking status: Some Days    Current packs/day: 0.50    Average packs/day: 0.5 packs/day for 25.0 years (12.5 ttl pk-yrs)    Types: Cigarettes   Smokeless tobacco: Never   Tobacco comments:    No more than 10 a day, per patient.   Vaping Use   Vaping status: Never Used  Substance Use Topics   Alcohol use: Not Currently   Drug use: Never    Family Medical History: No family history on file.  Physical Examination: Vitals:   11/26/22 1004  BP: 134/84    General: Patient is well developed, well nourished, calm, collected, and in no apparent distress. Attention to examination is appropriate.  Respiratory: Patient is breathing without any difficulty.   NEUROLOGICAL:     Awake, alert, oriented to person, place, and time.  Speech is clear and fluent. Fund of knowledge is appropriate.   Cranial Nerves: Pupils equal round and reactive to light.  Facial tone is symmetric.    Well healed lumbar incision. No lower lumbar tenderness.   No abnormal lesions on exposed skin.   Strength: Side Biceps Triceps Deltoid Interossei Grip Wrist Ext. Wrist Flex.  R 5 5 5 5 5 5 5   L 5 5 5 5 5 5 5    Side Iliopsoas Quads Hamstring PF DF EHL  R 5 5 5 5 5 5   L 5 5 5 5 5 5    Reflexes are 2+ and symmetric at the biceps, brachioradialis, patella and achilles.   Hoffman's is absent.  Clonus is not present.    Bilateral upper and lower extremity sensation is intact to light touch, but he has some hypersensitivity in bilateral anterior thighs.   Gait is normal.    Medical Decision Making  Imaging: Lumbar xrays dated 11/14/22:  Lumbar spondylosis with DDD L4-S1, slip L4-L5.   No report available for above xrays.   Assessment and Plan: Mr. Conover is a pleasant 57 y.o. male who is s/p L3-L4 and L5-S1 decompression with right L4-L5 microdiscectomy on 11/03/20 by Dr. Myer Haff.   2 week history of intermittent LBP but primary complaint is constant bilateral anterior thigh pain, left leg > right leg pain. Leg pain > LBP. He has numbness and tingling in his legs.   He has known lumbar spondylosis with DDD L4-S1, slip L4-L5.  Treatment options discussed with patient and following plan made:   -  MRI of lumbar spine to further evaluate lumbar radiculopathy. This is new pain s/p his previous surgery.  - No relief with flexeril or prednisone. New prescription for robaxin to take prn muscle spasms. Reviewed dosing and side effects. Discussed this can cause drowsiness.  - Given work note keeping him out today. He can return tomorrow with restrictions. This is pending his follow up.  - Will schedule follow up visit to review MRI results once I get them back.   I spent a total of 25 minutes in face-to-face and non-face-to-face activities related to this patient's care today including review of outside records, review of imaging, review of symptoms, physical exam, discussion of differential diagnosis, discussion of treatment options, and documentation.   Thank you for involving me in the care of this patient.   Drake Leach PA-C Dept. of Neurosurgery

## 2022-11-26 ENCOUNTER — Encounter: Payer: Self-pay | Admitting: Orthopedic Surgery

## 2022-11-26 ENCOUNTER — Ambulatory Visit (INDEPENDENT_AMBULATORY_CARE_PROVIDER_SITE_OTHER): Payer: BC Managed Care – PPO | Admitting: Orthopedic Surgery

## 2022-11-26 VITALS — BP 134/84 | Ht 72.0 in | Wt 233.0 lb

## 2022-11-26 DIAGNOSIS — M5136 Other intervertebral disc degeneration, lumbar region: Secondary | ICD-10-CM | POA: Diagnosis not present

## 2022-11-26 DIAGNOSIS — M4726 Other spondylosis with radiculopathy, lumbar region: Secondary | ICD-10-CM | POA: Diagnosis not present

## 2022-11-26 DIAGNOSIS — M4316 Spondylolisthesis, lumbar region: Secondary | ICD-10-CM

## 2022-11-26 DIAGNOSIS — M5416 Radiculopathy, lumbar region: Secondary | ICD-10-CM

## 2022-11-26 DIAGNOSIS — M51369 Other intervertebral disc degeneration, lumbar region without mention of lumbar back pain or lower extremity pain: Secondary | ICD-10-CM

## 2022-11-26 DIAGNOSIS — M47816 Spondylosis without myelopathy or radiculopathy, lumbar region: Secondary | ICD-10-CM

## 2022-11-26 MED ORDER — METHOCARBAMOL 500 MG PO TABS
500.0000 mg | ORAL_TABLET | Freq: Three times a day (TID) | ORAL | 0 refills | Status: DC | PRN
Start: 2022-11-26 — End: 2023-02-13

## 2022-11-26 NOTE — Patient Instructions (Signed)
It was so nice to see you today. Thank you so much for coming in.    You have some wear and tear in your back and this may be causing your pain.   I want to get an MRI of your lower back to look into things further. We will get this approved through your insurance and [facility] will call you to schedule the appointment.   [facility] (building with the white pillars) is located off of [address]. The address is [address] [place], [place] [address].   After you have the MRI, it takes 10-14 days for me to get the results back. Once I have them, we will call you to schedule a follow up visit with me to review them.   I sent a prescription for methocarbamol to help with muscle spasms. Use only as needed and be careful, this can make you sleepy.   I gave you a note for work. Let me know if you need anything else.   Please do not hesitate to call if you have any questions or concerns. You can also message me in MyChart.   Drake Leach PA-C [phone]

## 2022-12-04 ENCOUNTER — Ambulatory Visit: Payer: BC Managed Care – PPO

## 2022-12-04 ENCOUNTER — Ambulatory Visit
Admission: RE | Admit: 2022-12-04 | Discharge: 2022-12-04 | Disposition: A | Discharge status: Home / Self Care | Payer: BC Managed Care – PPO | Source: Ambulatory Visit | Attending: Orthopedic Surgery | Admitting: Orthopedic Surgery

## 2022-12-04 DIAGNOSIS — M51369 Other intervertebral disc degeneration, lumbar region without mention of lumbar back pain or lower extremity pain: Secondary | ICD-10-CM | POA: Diagnosis present

## 2022-12-04 DIAGNOSIS — M48061 Spinal stenosis, lumbar region without neurogenic claudication: Secondary | ICD-10-CM | POA: Diagnosis not present

## 2022-12-04 DIAGNOSIS — M5416 Radiculopathy, lumbar region: Secondary | ICD-10-CM | POA: Diagnosis not present

## 2022-12-04 DIAGNOSIS — M4316 Spondylolisthesis, lumbar region: Secondary | ICD-10-CM | POA: Insufficient documentation

## 2022-12-04 DIAGNOSIS — M47816 Spondylosis without myelopathy or radiculopathy, lumbar region: Secondary | ICD-10-CM | POA: Insufficient documentation

## 2022-12-04 DIAGNOSIS — M5136 Other intervertebral disc degeneration, lumbar region with discogenic back pain only: Secondary | ICD-10-CM | POA: Diagnosis not present

## 2022-12-20 ENCOUNTER — Other Ambulatory Visit: Payer: Self-pay | Admitting: Family Medicine

## 2022-12-21 NOTE — Telephone Encounter (Signed)
Requests are too soon for refill. Last refill 10/17/22 for 90 and 1 refill.  Requested Prescriptions  Pending Prescriptions Disp Refills   metFORMIN (GLUCOPHAGE-XR) 500 MG 24 hr tablet [Pharmacy Med Name: METFORMIN HCL ER 500 MG TABLET] 360 tablet 3    Sig: TAKE 2 TABLETS BY MOUTH TWICE A DAY     Endocrinology:  Diabetes - Biguanides Failed - 12/20/2022  2:31 PM      Failed - B12 Level in normal range and within 720 days    No results found for: "VITAMINB12"       Passed - Cr in normal range and within 360 days    Creatinine, Ser  Date Value Ref Range Status  07/19/2022 0.79 0.76 - 1.27 mg/dL Final         Passed - HBA1C is between 0 and 7.9 and within 180 days    Hemoglobin A1C  Date Value Ref Range Status  10/17/2022 6.8 (A) 4.0 - 5.6 % Final  07/25/2017 5.8  Final   HB A1C (BAYER DCA - WAIVED)  Date Value Ref Range Status  05/01/2022 11.4 (H) 4.8 - 5.6 % Final    Comment:             Prediabetes: 5.7 - 6.4          Diabetes: >6.4          Glycemic control for adults with diabetes: <7.0    Hgb A1c MFr Bld  Date Value Ref Range Status  07/19/2022 9.2 (H) 4.8 - 5.6 % Final    Comment:             Prediabetes: 5.7 - 6.4          Diabetes: >6.4          Glycemic control for adults with diabetes: <7.0          Passed - eGFR in normal range and within 360 days    GFR calc Af Amer  Date Value Ref Range Status  04/22/2020 96 >59 mL/min/1.73 Final    Comment:    **In accordance with recommendations from the NKF-ASN Task force,**   [facility] is in the process of updating its eGFR calculation to the   2021 CKD-EPI creatinine equation that estimates kidney function   without a race variable.    GFR, Estimated  Date Value Ref Range Status  07/28/2020 >60 >60 mL/min Final    Comment:    (NOTE) Calculated using the CKD-EPI Creatinine Equation (2021)    eGFR  Date Value Ref Range Status  07/19/2022 106 >59 mL/min/1.73 Final         Passed - Valid encounter within  last 6 months    Recent Outpatient Visits           2 months ago Type 2 diabetes mellitus with hyperglycemia, without long-term current use of insulin [facility])   [facility] [place] [facility] Merita Norton T, FNP   5 months ago Hypertension associated with diabetes [facility])   [place] [facility] [name], FNP   7 months ago Routine general medical examination at a health care facility   [facility], Megan P, DO   8 months ago Primary hypertension   [place] [facility] Larae Grooms, NP   9 months ago Acute non-recurrent maxillary sinusitis   [place] [facility] Dorcas Carrow, DO       Future Appointments  In 3 weeks [name], FNP [facility]            Passed - CBC within normal limits and completed in the last 12 months    WBC  Date Value Ref Range Status  05/01/2022 9.6 3.4 - 10.8 x10E3/uL Final  07/28/2020 10.8 (H) 4.0 - 10.5 K/uL Final   RBC  Date Value Ref Range Status  05/01/2022 4.71 4.14 - 5.80 x10E6/uL Final  07/28/2020 4.60 4.22 - 5.81 MIL/uL Final   Hemoglobin  Date Value Ref Range Status  05/01/2022 15.4 13.0 - 17.7 g/dL Final   Hematocrit  Date Value Ref Range Status  05/01/2022 44.2 37.5 - 51.0 % Final   MCHC  Date Value Ref Range Status  05/01/2022 34.8 31.5 - 35.7 g/dL Final  16/11/9602 54.0 30.0 - 36.0 g/dL Final   [facility]  Date Value Ref Range Status  05/01/2022 32.7 26.6 - 33.0 pg Final  07/28/2020 31.5 26.0 - 34.0 pg Final   MCV  Date Value Ref Range Status  05/01/2022 94 79 - 97 fL Final   No results found for: "PLTCOUNTKUC", "LABPLAT", "POCPLA" RDW  Date Value Ref Range Status  05/01/2022 11.5 (L) 11.6 - 15.4 % Final          lisinopril (ZESTRIL) 20 MG tablet [Pharmacy Med Name: LISINOPRIL 20 MG TABLET] 90 tablet 0    Sig: TAKE 1 TABLET BY MOUTH EVERY DAY     Cardiovascular:  ACE  Inhibitors Failed - 12/20/2022  2:31 PM      Failed - K in normal range and within 180 days    Potassium  Date Value Ref Range Status  07/19/2022 3.4 (L) 3.5 - 5.2 mmol/L Final         Passed - Cr in normal range and within 180 days    Creatinine, Ser  Date Value Ref Range Status  07/19/2022 0.79 0.76 - 1.27 mg/dL Final         Passed - Patient is not pregnant      Passed - Last BP in normal range    BP Readings from Last 1 Encounters:  11/26/22 134/84         Passed - Valid encounter within last 6 months    Recent Outpatient Visits           2 months ago Type 2 diabetes mellitus with hyperglycemia, without long-term current use of insulin [facility])   [place] [facility] Merita Norton T, FNP   5 months ago Hypertension associated with diabetes [facility])   [place] [facility] [name], FNP   7 months ago Routine general medical examination at a health care facility   [facility] Musella, Megan P, DO   8 months ago Primary hypertension   [place] [facility] Larae Grooms, NP   9 months ago Acute non-recurrent maxillary sinusitis   [place] [facility] [place], Oralia Rud, DO       Future Appointments             In 3 weeks [name], FNP [facility], [facility]

## 2022-12-29 NOTE — Progress Notes (Addendum)
Referring Physician:  [name], FNP [address] [place],  [place] [address]  Primary Physician:  [name], FNP  History of Present Illness: [name] has a history of DM, HTN,   He is s/p L3-L4 and L5-S1 decompression with right L4-L5 microdiscectomy on 11/03/20 by Dr. Myer Haff.   Last seen by me on 11/26/22 for intermittent LBP with his primary complaint being constant bilateral anterior thigh pain, left leg > right leg pain. Leg pain > LBP.    He has known lumbar spondylosis with DDD L4-S1, slip L4-L5.  He was given robaxin at his last visit. He is here to review his lumbar MRI.   He has constant LBP with bilateral lateral/anterior leg pain to his knees that is worse with standing and walking. LBP = leg pain. Left leg pain = right leg pain. He has numbness and tingling in his legs. Notices a little weakness in his legs. No relief with robaxin.   Bowel/Bladder Dysfunction: none  He smokes about 10 cigarettes a day x 25 years.   Conservative measures:  Physical therapy: has not participated in   Multimodal medical therapy including regular antiinflammatories: norco, neurontin, prednisone, flexeril  Injections: has not received epidural steroid injections  Past Surgery:  L3-L4 and L5-S1 decompression with right L4-L5 microdiscectomy on 11/03/20 by Dr. Molli Knock has no symptoms of cervical myelopathy.  The symptoms are causing a significant impact on the patient's life.   Review of Systems:  A 10 point review of systems is negative, except for the pertinent positives and negatives detailed in the HPI.  Past Medical History: Past Medical History:  Diagnosis Date   Allergy    Carpal tunnel syndrome    Diabetes mellitus without complication (HCC)    no meds, A1C down   Gout    Hypertension     Past Surgical History: Past Surgical History:  Procedure Laterality Date   COLONOSCOPY WITH PROPOFOL N/A 03/30/2019   Procedure: COLONOSCOPY  WITH BIOPSY;  Surgeon: Wyline Mood, MD;  Location: [facility];  Service: Endoscopy;  Laterality: N/A;  Priority 4   COLONOSCOPY WITH PROPOFOL N/A 05/16/2022   Procedure: COLONOSCOPY WITH PROPOFOL;  Surgeon: Wyline Mood, MD;  Location: [facility] END[facility];  Service: Gastroenterology;  Laterality: N/A;   FRACTURE SURGERY     left ankle    Left hand surgery  03/2018   Carpal tunnel   LUMBAR LAMINECTOMY/DECOMPRESSION MICRODISCECTOMY N/A 08/03/2020   Procedure: L3-4 AND L5-S1 DECOMPRESSION, RIGHT L4-5 MICRODISCECTOMY;  Surgeon: Venetia Night, MD;  Location: [facility] ORS;  Service: Neurosurgery;  Laterality: N/A;   POLYPECTOMY N/A 03/30/2019   Procedure: POLYPECTOMY;  Surgeon: Wyline Mood, MD;  Location: [facility];  Service: Endoscopy;  Laterality: N/A;   SPINE SURGERY     TONSILLECTOMY     adniods   TOOTH EXTRACTION      Allergies: Allergies as of 01/02/2023 - Review Complete 11/26/2022  Allergen Reaction Noted   Gabapentin Anaphylaxis 10/21/2019    Medications: Outpatient Encounter Medications as of 01/02/2023  Medication Sig   allopurinol (ZYLOPRIM) 100 MG tablet TAKE 2 TABLETS BY MOUTH EVERY DAY   amLODipine (NORVASC) 10 MG tablet Take 1 tablet (10 mg total) by mouth daily.   aspirin 81 MG tablet Take 81 mg by mouth daily.   Aspirin-Salicylamide-Caffeine (BC HEADACHE PO) Take 1 packet by mouth daily as needed (headaches).   Blood Glucose Monitoring Suppl (FIFTY50 GLUCOSE METER 2.0) w/Device KIT Use to check fasting  blood sugars daily   Blood Glucose Monitoring Suppl (ONE TOUCH ULTRA 2) w/Device KIT 1 each by Does not apply route daily.   glipiZIDE (GLUCOTROL) 10 MG tablet Take 1 tablet (10 mg total) by mouth daily. Take before largest meal   glucose blood (ONETOUCH ULTRA) test strip 1 each by Other route daily.   lisinopril (ZESTRIL) 40 MG tablet Take 1 tablet (40 mg total) by mouth daily.   metFORMIN (GLUCOPHAGE-XR) 500 MG 24 hr tablet Take 2 tablets (1,000 mg total)  by mouth 2 (two) times daily with a meal.   methocarbamol (ROBAXIN) 500 MG tablet Take 1 tablet (500 mg total) by mouth every 8 (eight) hours as needed for muscle spasms. This can make you sleepy.   Multiple Vitamin (MULTIVITAMIN) tablet Take 1 tablet by mouth daily.   OneTouch Delica Lancets 33G MISC 1 each by Does not apply route daily.   rosuvastatin (CRESTOR) 20 MG tablet Take 1 tablet (20 mg total) by mouth daily.   tadalafil (CIALIS) 20 MG tablet Take 1 tablet (20 mg total) by mouth daily.   No facility-administered encounter medications on file as of 01/02/2023.    Social History: Social History   Tobacco Use   Smoking status: Some Days    Current packs/day: 0.50    Average packs/day: 0.5 packs/day for 25.0 years (12.5 ttl pk-yrs)    Types: Cigarettes   Smokeless tobacco: Never   Tobacco comments:    No more than 10 a day, per patient.   Vaping Use   Vaping status: Never Used  Substance Use Topics   Alcohol use: Not Currently   Drug use: Never    Family Medical History: No family history on file.  Physical Examination: There were no vitals filed for this visit.    Awake, alert, oriented to person, place, and time.  Speech is clear and fluent. Fund of knowledge is appropriate.   Cranial Nerves: Pupils equal round and reactive to light.  Facial tone is symmetric.    Well healed lumbar incision. No lower lumbar tenderness.   No abnormal lesions on exposed skin.   Strength:  Side Iliopsoas Quads Hamstring PF DF EHL  R 5 5 5 5 5 5   L 5 5 5 5 5 5    Reflexes are 2+ and symmetric at the patella and achilles.   Clonus is not present.   Bilateral upper and lower extremity sensation is intact to light touch, but he has some hypersensitivity in bilateral anterior/lateral thighs.   Gait is normal.    Medical Decision Making  Imaging: Lumbar MRI dated 12/04/22:  FINDINGS: Segmentation:  Standard.   Alignment:  Physiologic.   Vertebrae: No acute fracture, evidence  of discitis, or aggressive bone lesion.   Conus medullaris and cauda equina: Conus extends to the L2 level. Conus and cauda equina appear normal.   Paraspinal and other soft tissues: No acute paraspinal abnormality.   Disc levels:   Disc spaces: Severe disc height loss at L5-S1. Disc degeneration at L4-5.   T12-L1: No significant disc bulge. Mild bilateral facet arthropathy. No foraminal or central canal stenosis.   L1-L2: No significant disc bulge. Mild bilateral facet arthropathy. No foraminal or central canal stenosis.   L2-L3: Moderate disc bulge flattening the ventral thecal sac. Mild spinal stenosis. No foraminal stenosis.   L3-L4: Moderate disc bulge. Mild bilateral facet arthropathy. Moderate spinal stenosis. Bilateral lateral recess stenosis. Mild bilateral foraminal stenosis.   L4-L5: Moderate broad disc bulge with a central  disc extrusion and cephalad migration of disc material. Moderate bilateral facet arthropathy. Severe spinal stenosis.   L5-S1: Mild disc osteophyte complex. Mild bilateral facet arthropathy. Moderate-severe bilateral foraminal stenosis. No spinal stenosis.   IMPRESSION: 1. At L4-5 there is a moderate broad disc bulge with a central disc extrusion and cephalad migration of disc material. Moderate bilateral facet arthropathy. Severe spinal stenosis. 2. At L3-4 there is a moderate disc bulge. Mild bilateral facet arthropathy. Moderate spinal stenosis. Bilateral lateral recess stenosis. Mild bilateral foraminal stenosis. 3. At L5-S1 there is a mild disc osteophyte complex. Mild bilateral facet arthropathy. Moderate-severe bilateral foraminal stenosis. 4. No acute osseous injury of the lumbar spine.     Electronically Signed   By: Elige Ko M.D.   On: 12/24/2022 16:02   I have personally reviewed the images and agree with the above interpretation.   Assessment and Plan: Mr. Morefield is a pleasant 57 y.o. male who is s/p L3-L4 and L5-S1  decompression with right L4-L5 microdiscectomy on 11/03/20 by Dr. Myer Haff.   2 week history of intermittent LBP but primary complaint is constant bilateral anterior thigh pain, left leg > right leg pain. Leg pain > LBP. He has numbness and tingling in his legs.   He has known lumbar spondylosis with DDD L4-S1, slip L4-L5. MRI shows disc at L4-L5 with superior migration and severe spinal stenosis. Also with bilateral lateral recess and bilateral foraminal stenosis L3-L4 and moderate/severe bilateral foraminal stenosis L5-S1.   Current symptoms likely from L3-L4 and/or L4-L5.   Treatment options discussed with patient and following plan made:   - No relief with flexeril, prednisone, robaxin. Allergy to neurontin. Will hold on further medications.  - PT for lumbar spine. Orders to Renew PT.  - Referral back to Dr. Cherylann Ratel to consider lumbar injections.  - Will continue light duty at work. He will let me know if he needs to come out.  - Follow up with me in 6 weeks. If no improvement with above, may need to see Dr. Myer Haff to discuss further surgical options. Consider updated lumbar flex/ext xrays prior to this visit.   I spent a total of 20 minutes in face-to-face and non-face-to-face activities related to this patient's care today including review of outside records, review of imaging, review of symptoms, physical exam, discussion of differential diagnosis, discussion of treatment options, and documentation.   Drake Leach PA-C Dept. of Neurosurgery

## 2023-01-02 ENCOUNTER — Ambulatory Visit: Payer: BC Managed Care – PPO | Admitting: Orthopedic Surgery

## 2023-01-02 ENCOUNTER — Encounter: Payer: Self-pay | Admitting: Orthopedic Surgery

## 2023-01-02 VITALS — BP 130/84 | Ht 72.0 in | Wt 232.0 lb

## 2023-01-02 DIAGNOSIS — M47816 Spondylosis without myelopathy or radiculopathy, lumbar region: Secondary | ICD-10-CM

## 2023-01-02 DIAGNOSIS — M5126 Other intervertebral disc displacement, lumbar region: Secondary | ICD-10-CM | POA: Diagnosis not present

## 2023-01-02 DIAGNOSIS — M48061 Spinal stenosis, lumbar region without neurogenic claudication: Secondary | ICD-10-CM | POA: Diagnosis not present

## 2023-01-02 DIAGNOSIS — M5416 Radiculopathy, lumbar region: Secondary | ICD-10-CM

## 2023-01-02 DIAGNOSIS — M4726 Other spondylosis with radiculopathy, lumbar region: Secondary | ICD-10-CM | POA: Diagnosis not present

## 2023-01-02 NOTE — Patient Instructions (Signed)
It was so nice to see you today. Thank you so much for coming in.   You have some wear and tear in your back. I think your pain is from L3-L4 and L4-L5.    I sent physical therapy orders to [facility] in [place]. You can call them at [phone] if you don't hear from them to schedule your visit.   I want you to see pain management here in [place] (Dr. Cherylann Ratel) to discuss possible lumbar injections. They should call you to schedule an appointment or you can call them at [phone].   Let me know if we need to change your work note.   I will see you back in 6 weeks. Please do not hesitate to call if you have any questions or concerns. You can also message me in MyChart.   Drake Leach PA-C [phone]     The physicians and staff at [facility] at [facility] are committed to providing excellent care. You may receive a survey asking for feedback about your experience at our office. We value you your feedback and appreciate you taking the time to to fill it out. The [facility] leadership team is also available to discuss your experience in person, feel free to contact us [phone].

## 2023-01-08 DIAGNOSIS — M545 Low back pain, unspecified: Secondary | ICD-10-CM | POA: Diagnosis not present

## 2023-01-11 ENCOUNTER — Ambulatory Visit: Payer: BC Managed Care – PPO | Admitting: Family Medicine

## 2023-01-15 ENCOUNTER — Ambulatory Visit: Payer: BC Managed Care – PPO | Admitting: Family Medicine

## 2023-01-15 DIAGNOSIS — M545 Low back pain, unspecified: Secondary | ICD-10-CM | POA: Diagnosis not present

## 2023-01-22 DIAGNOSIS — M545 Low back pain, unspecified: Secondary | ICD-10-CM | POA: Diagnosis not present

## 2023-01-28 IMAGING — RF DG C-ARM 1-60 MIN
1 series · 6 of 6 positions shown · non-contrast
Comparison: MRI December 24, 2019.

CLINICAL DATA: Elective lumbar surgery.

EXAM:
LUMBAR SPINE - 2-3 VIEW; DG C-ARM 1-60 MIN
Radiation exposure index: 5.359 mGy.

[Series 1: dg x-ray · 0.20mm/px · 6 of 6 slices shown]
[im 1/6]
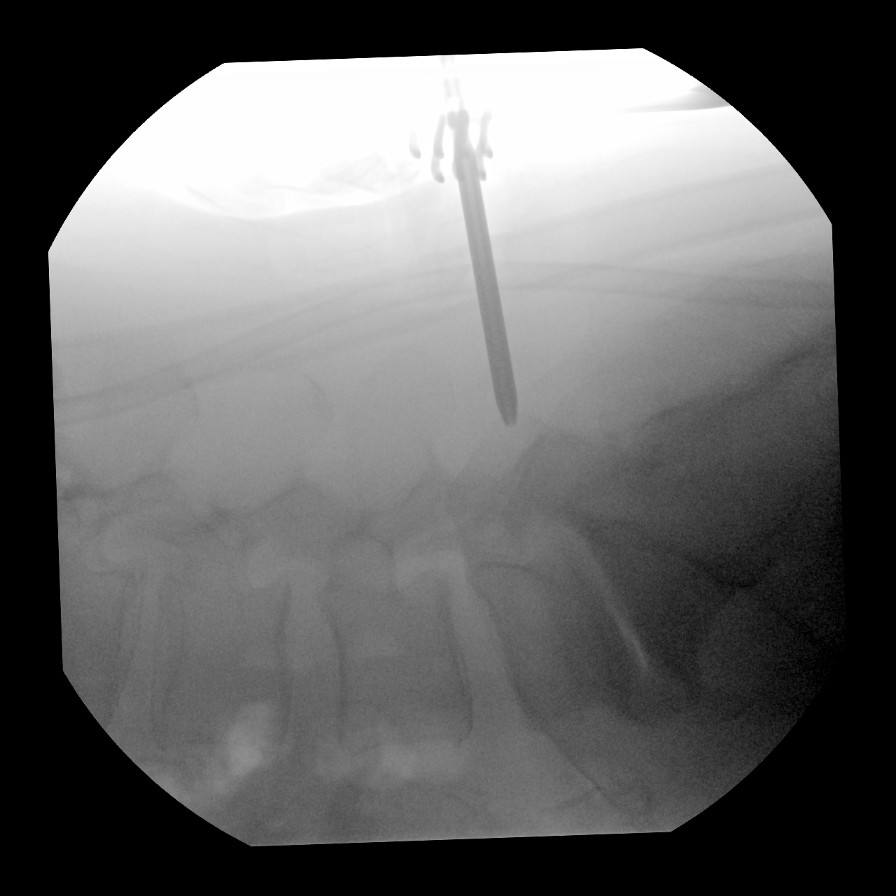
[im 2/6]
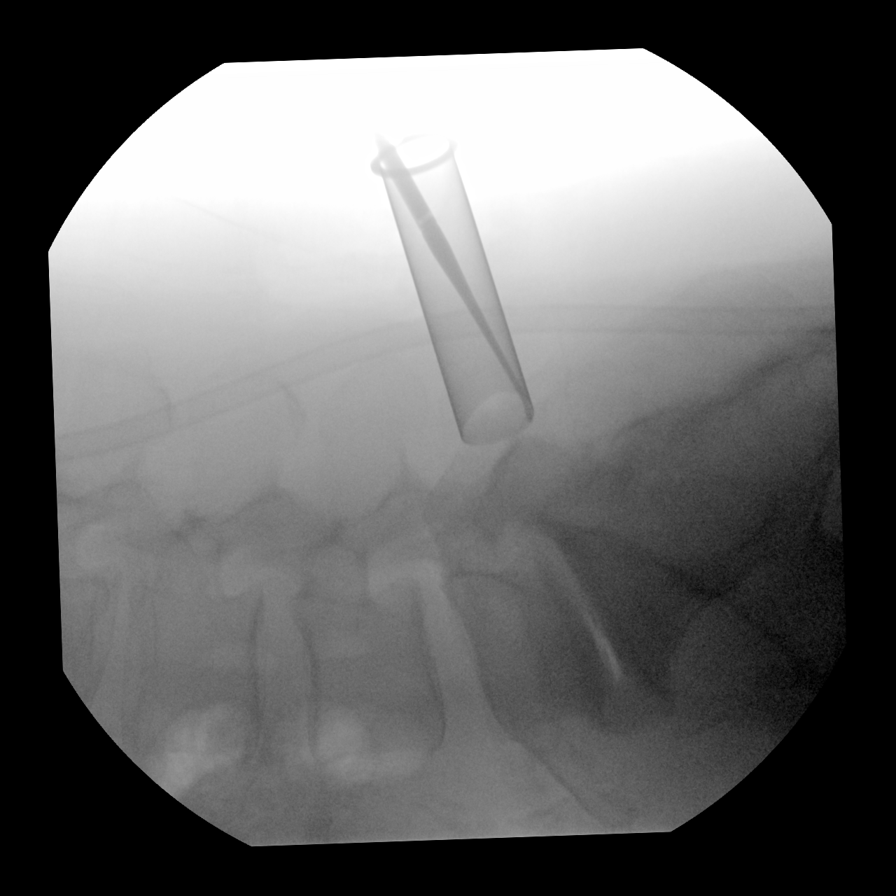
[im 3/6]
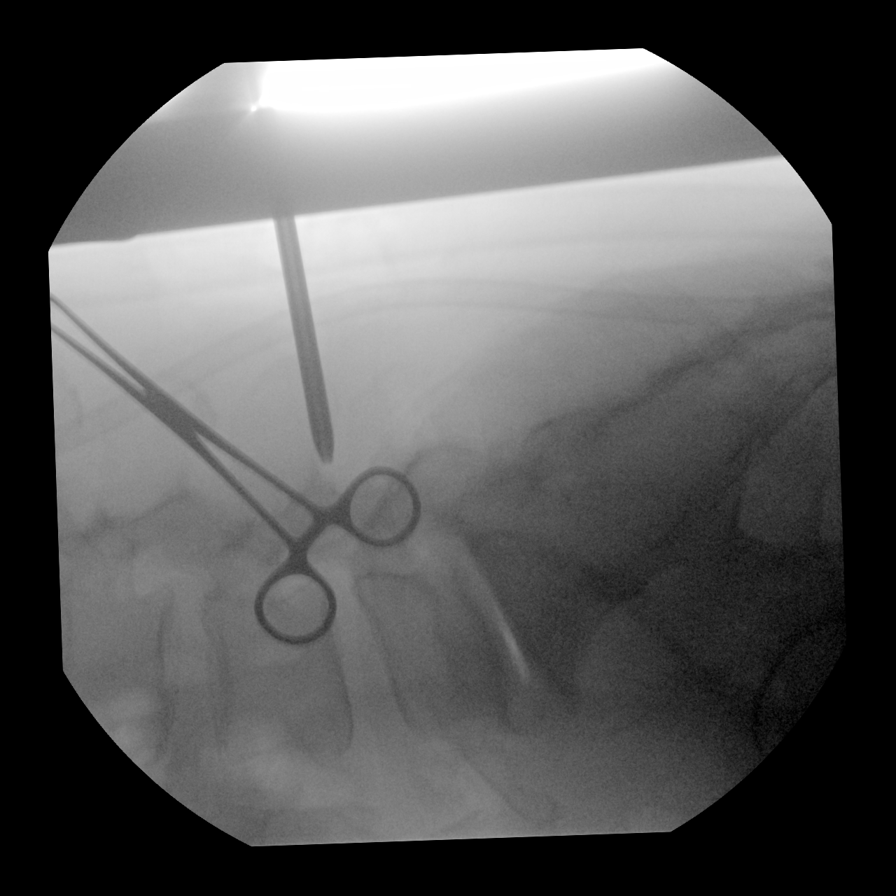
[im 4/6]
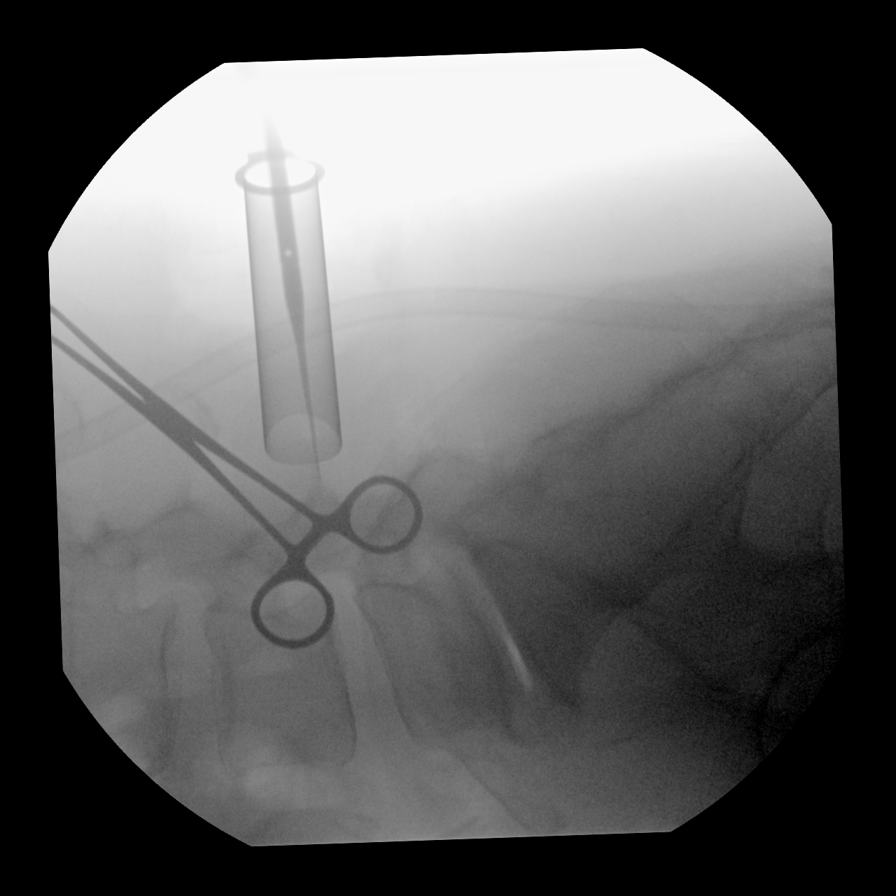
[im 5/6]
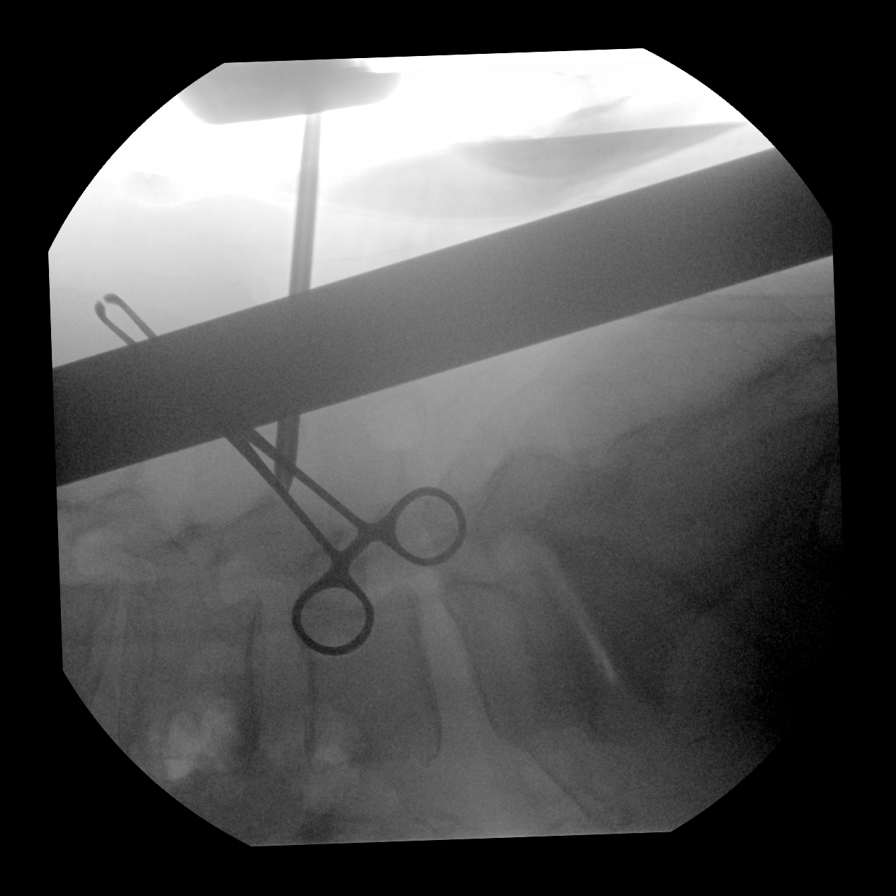
[im 6/6]
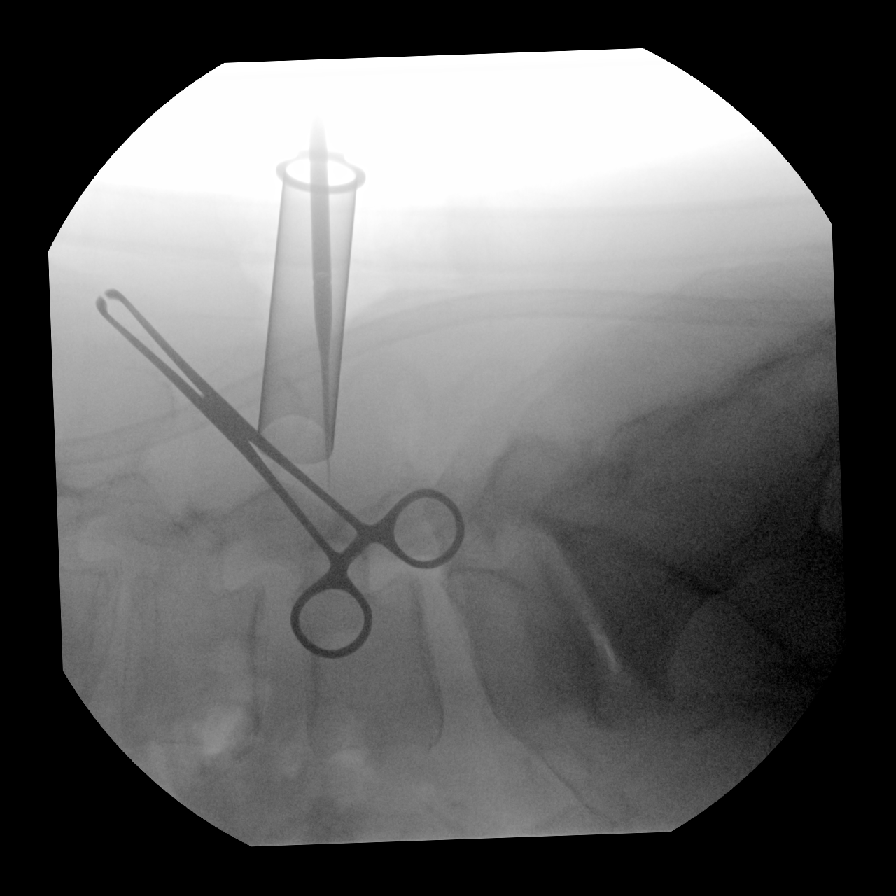

[6 of 6 positions shown; findings below may reference images not displayed]

FINDINGS: Six intraoperative fluoroscopic images were obtained of the lower
lumbar spine. The first 2 images demonstrate surgical localization
of the L5-S1 level. The next 2 images demonstrate surgical
localization of the L4-5 level. The final 2 images demonstrate
surgical localization of the L3-4 level.
IMPRESSION: Fluoroscopic guidance provided during lumbar surgery.

## 2023-01-28 IMAGING — RF DG C-ARM 1-60 MIN
1 series · 6 of 6 positions shown · non-contrast
Comparison: MRI December 24, 2019.

CLINICAL DATA: Elective lumbar surgery.

EXAM:
LUMBAR SPINE - 2-3 VIEW; DG C-ARM 1-60 MIN
Radiation exposure index: 5.359 mGy.

[Series 1: dg x-ray · 0.20mm/px · 6 of 6 slices shown]
[im 1/6]
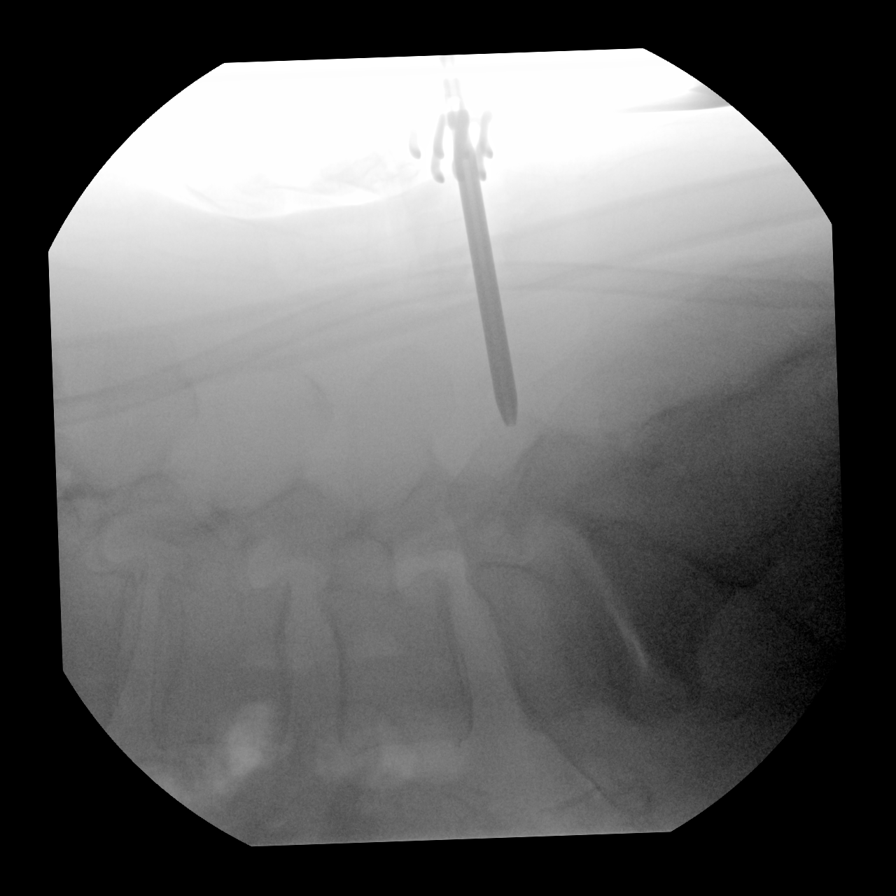
[im 2/6]
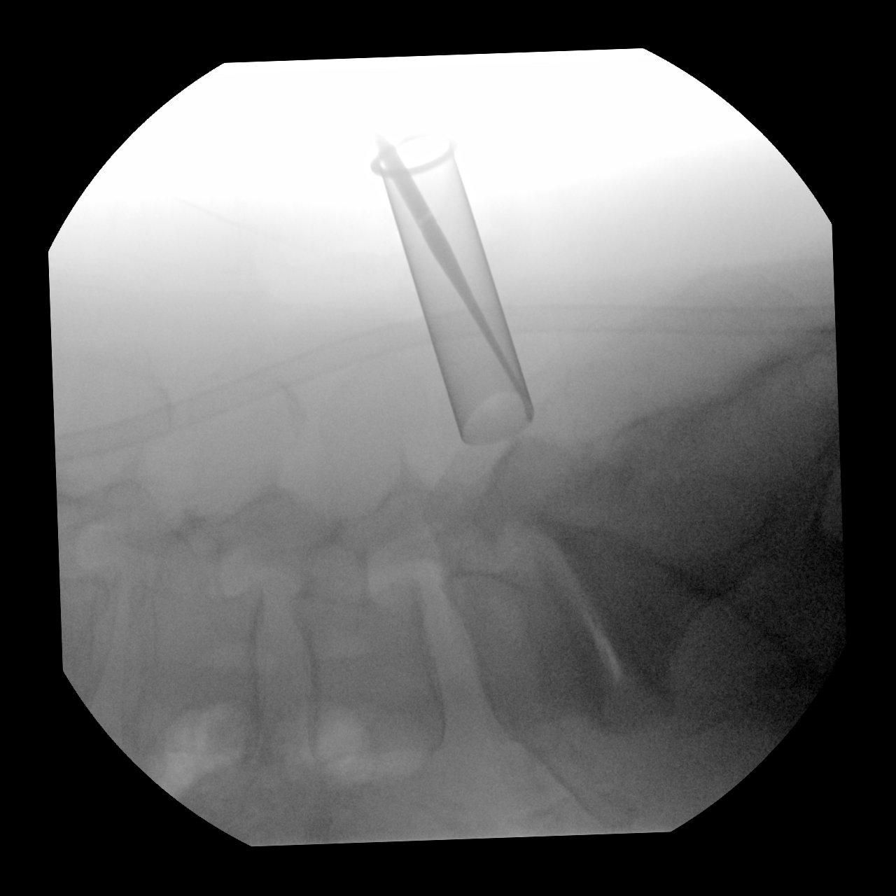
[im 3/6]
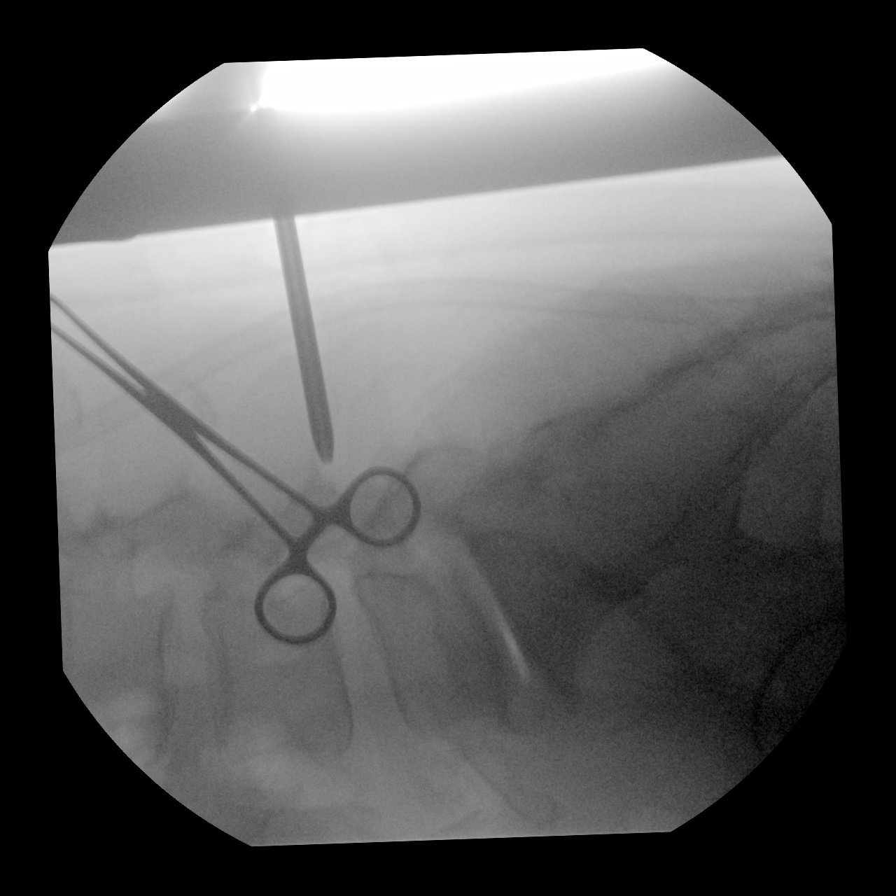
[im 4/6]
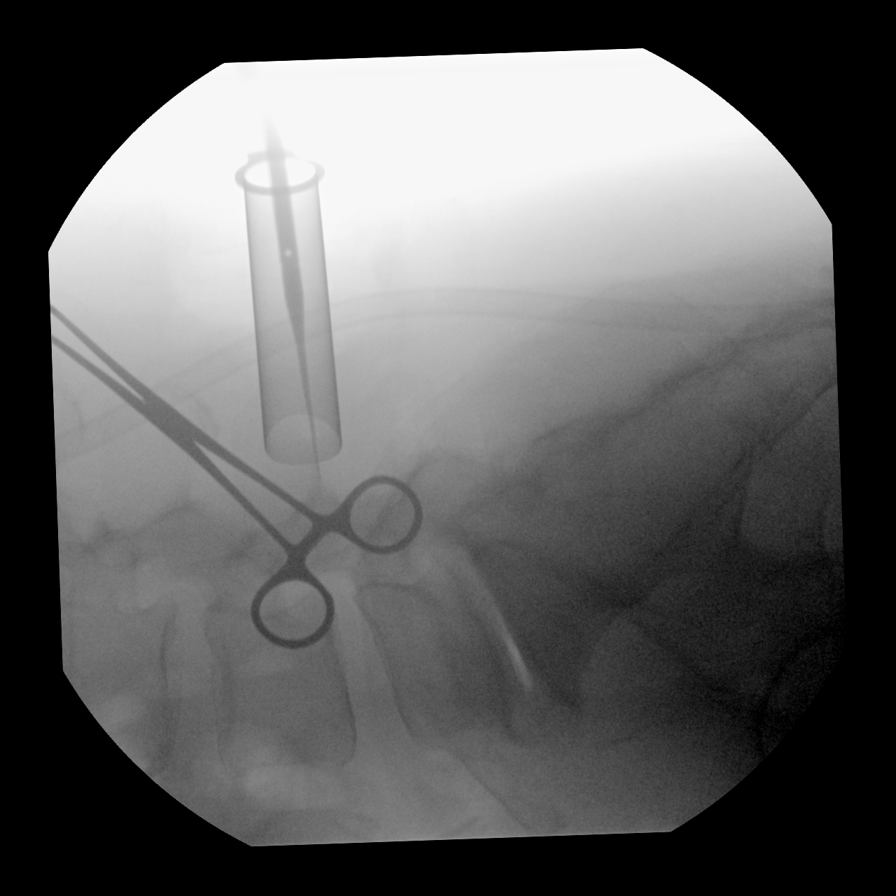
[im 5/6]
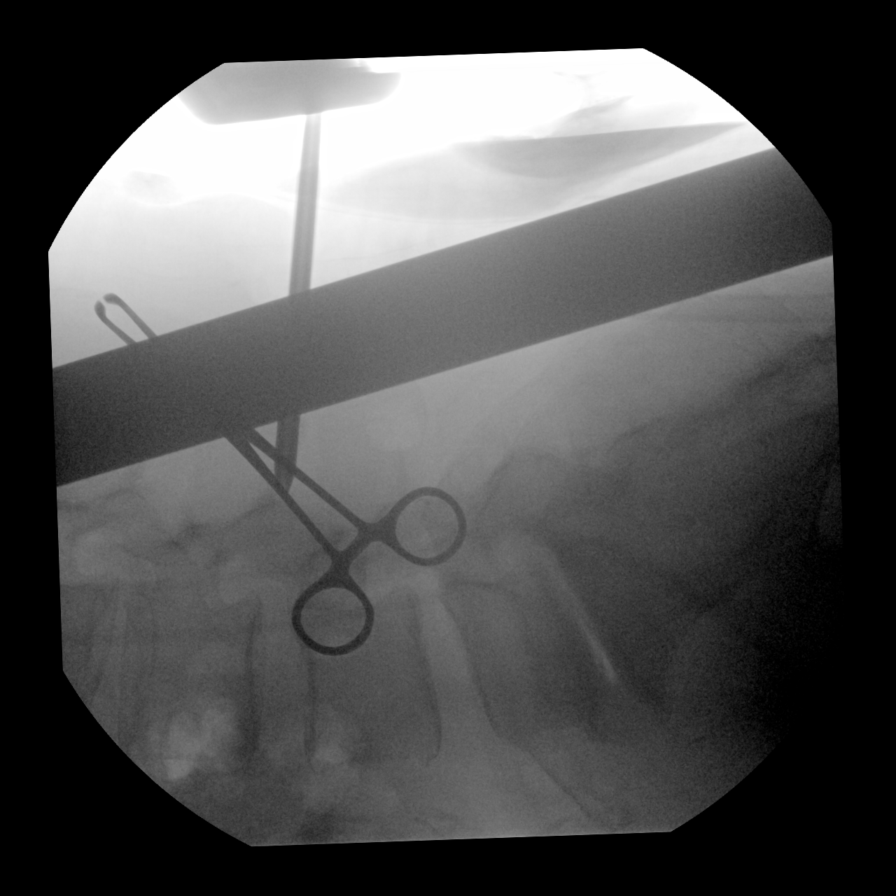
[im 6/6]
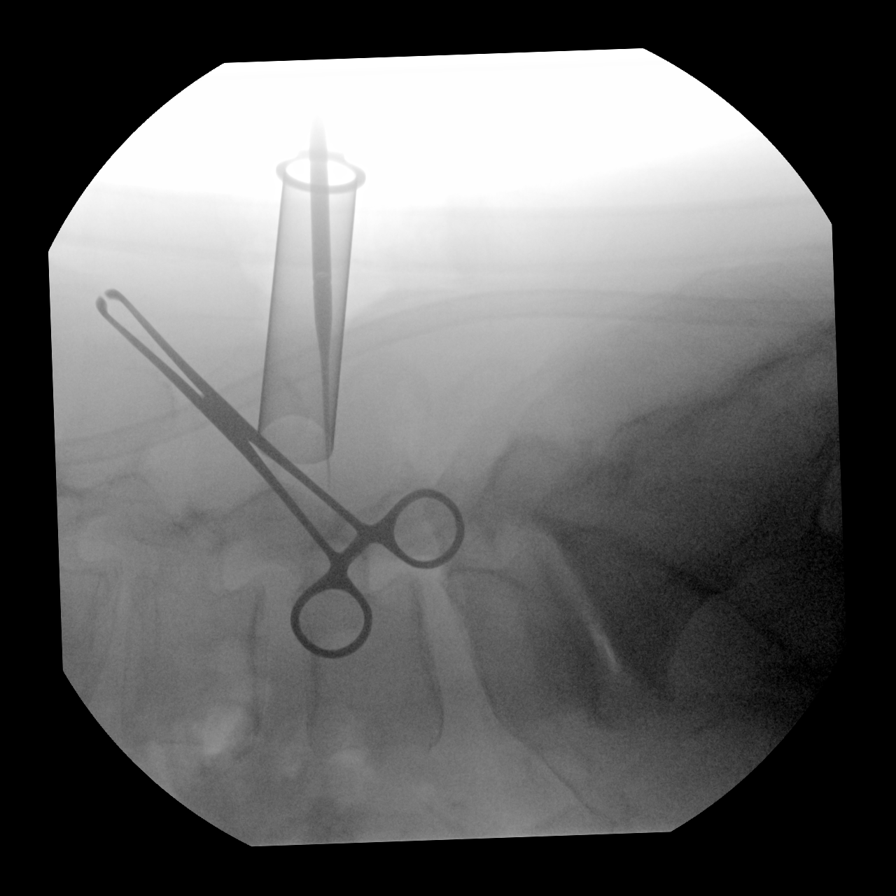

[6 of 6 positions shown; findings below may reference images not displayed]

FINDINGS: Six intraoperative fluoroscopic images were obtained of the lower
lumbar spine. The first 2 images demonstrate surgical localization
of the L5-S1 level. The next 2 images demonstrate surgical
localization of the L4-5 level. The final 2 images demonstrate
surgical localization of the L3-4 level.
IMPRESSION: Fluoroscopic guidance provided during lumbar surgery.

## 2023-01-28 IMAGING — RF DG LUMBAR SPINE 2-3V
1 series · 6 of 6 positions shown · non-contrast
Comparison: MRI December 24, 2019.

CLINICAL DATA: Elective lumbar surgery.

EXAM:
LUMBAR SPINE - 2-3 VIEW; DG C-ARM 1-60 MIN
Radiation exposure index: 5.359 mGy.

[Series 1: dg x-ray · 0.20mm/px · 6 of 6 slices shown]
[im 1/6]
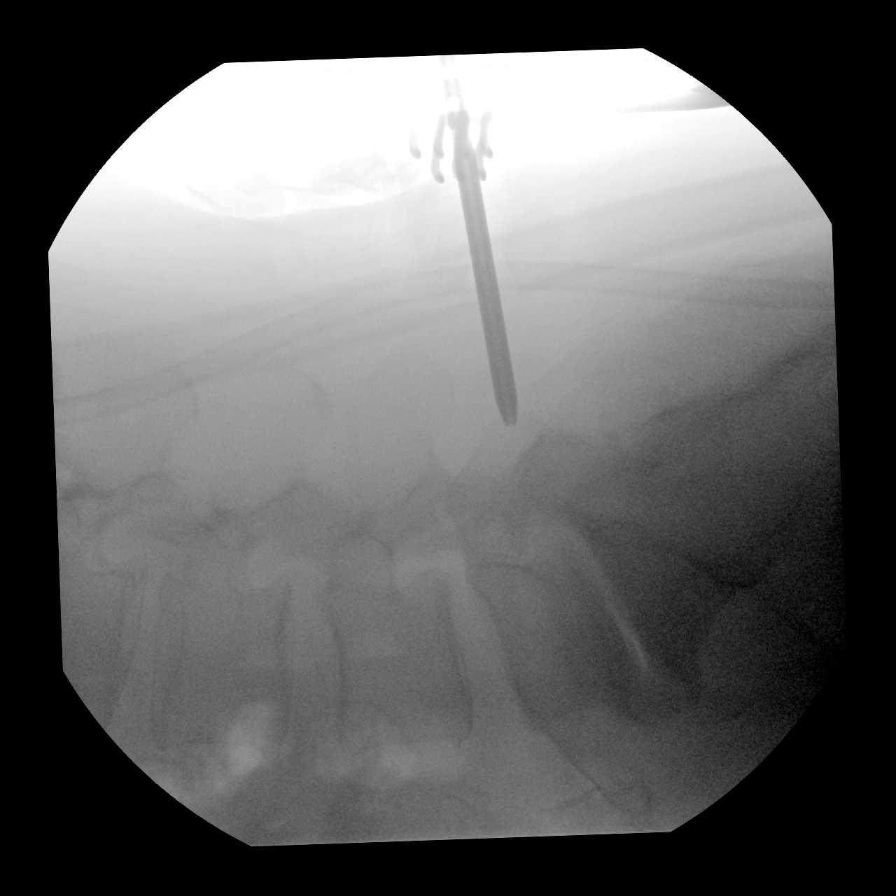
[im 2/6]
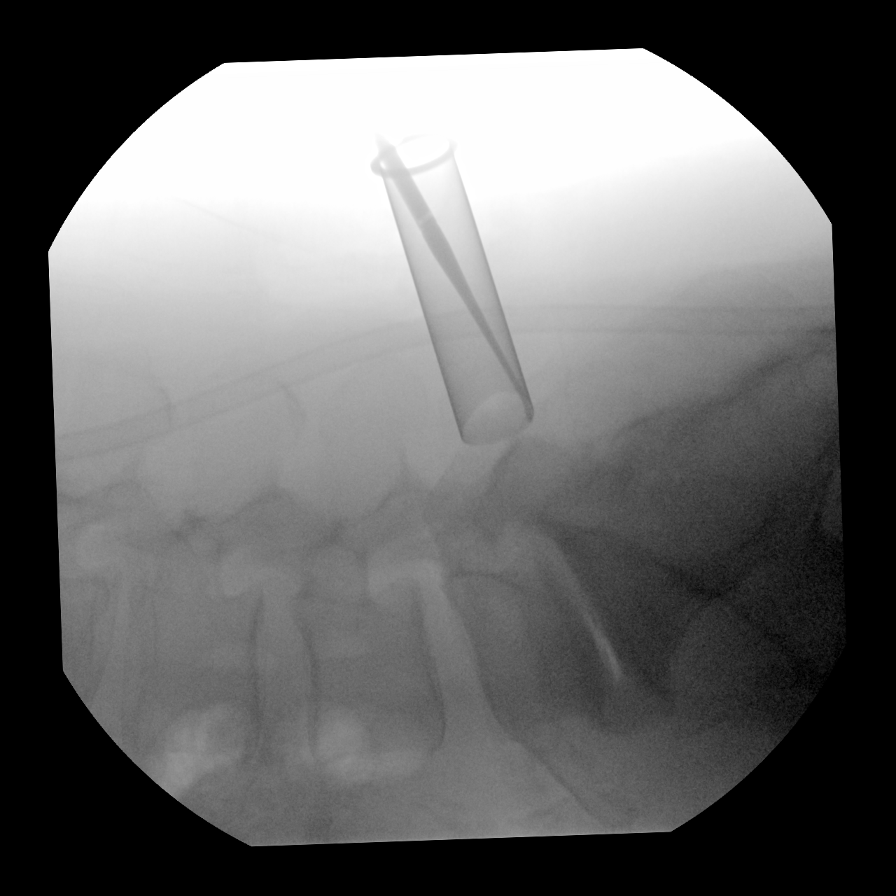
[im 3/6]
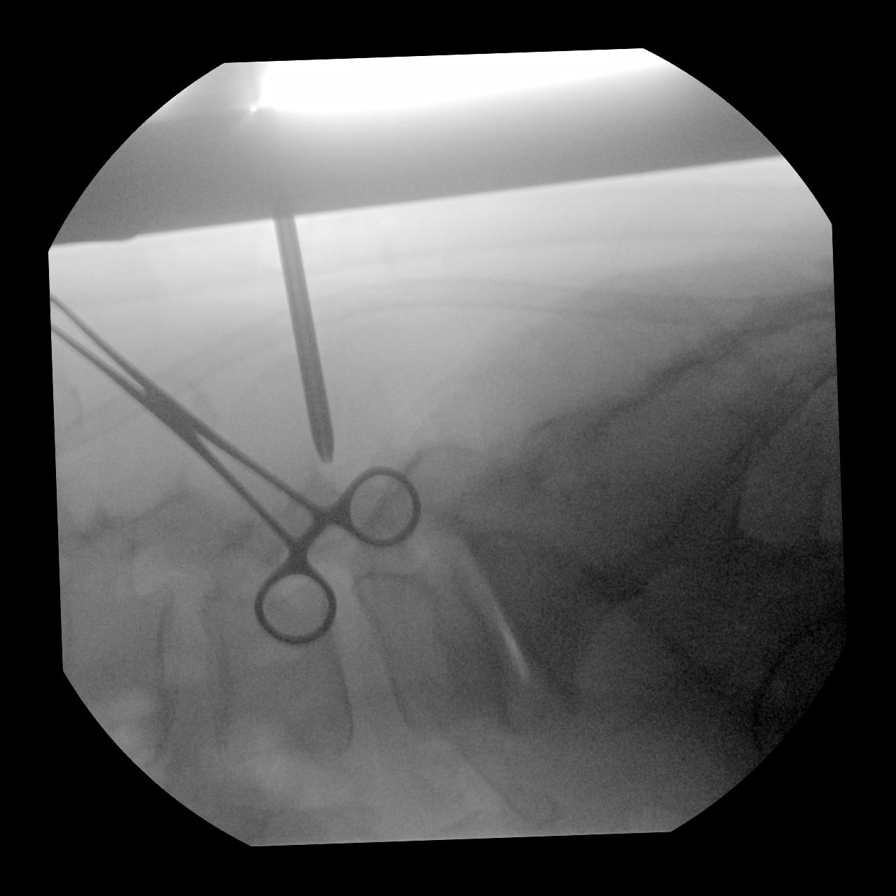
[im 4/6]
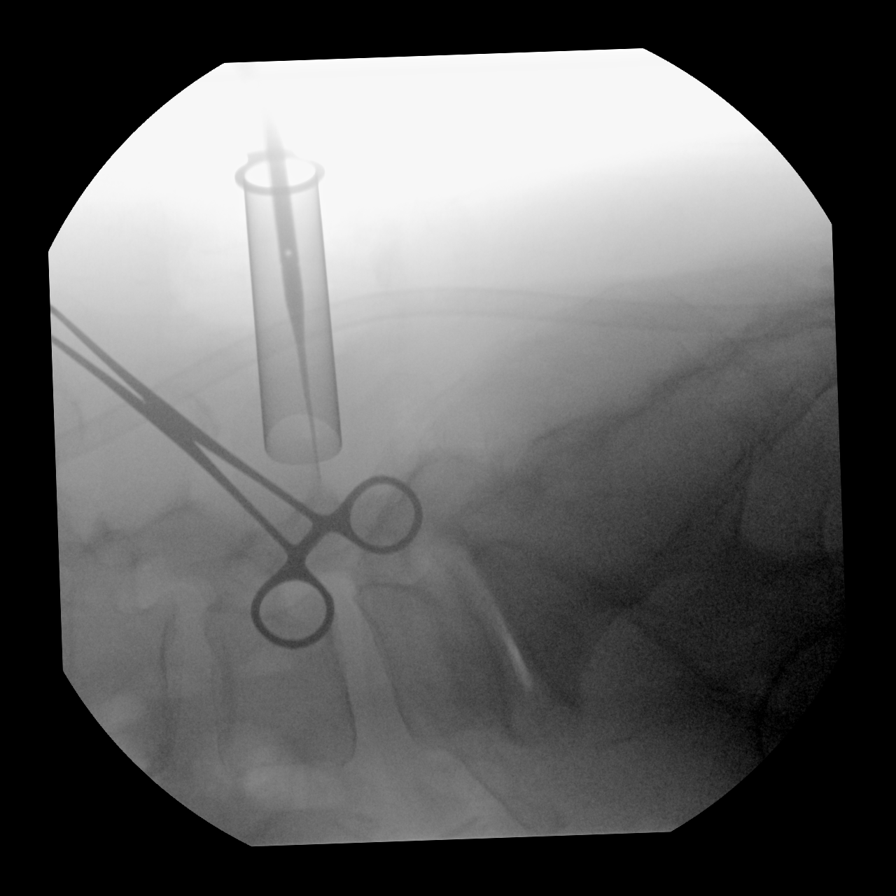
[im 5/6]
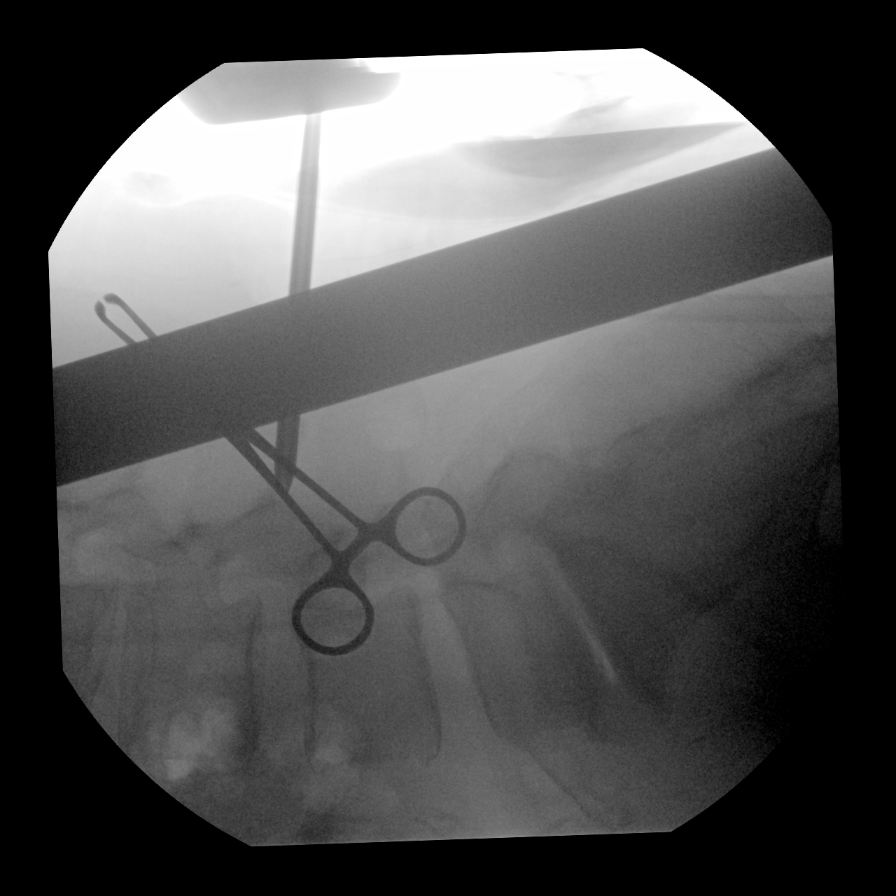
[im 6/6]
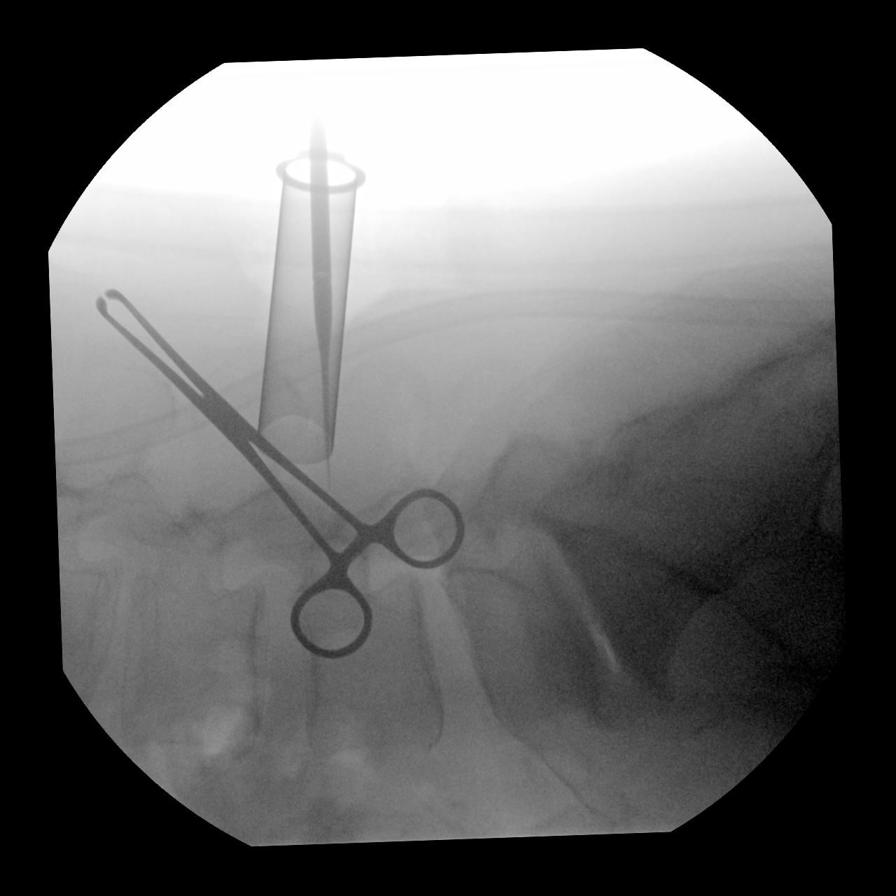

[6 of 6 positions shown; findings below may reference images not displayed]

FINDINGS: Six intraoperative fluoroscopic images were obtained of the lower
lumbar spine. The first 2 images demonstrate surgical localization
of the L5-S1 level. The next 2 images demonstrate surgical
localization of the L4-5 level. The final 2 images demonstrate
surgical localization of the L3-4 level.
IMPRESSION: Fluoroscopic guidance provided during lumbar surgery.

## 2023-01-29 ENCOUNTER — Ambulatory Visit: Payer: BC Managed Care – PPO | Admitting: Student in an Organized Health Care Education/Training Program

## 2023-01-29 ENCOUNTER — Ambulatory Visit (INDEPENDENT_AMBULATORY_CARE_PROVIDER_SITE_OTHER): Payer: BC Managed Care – PPO | Admitting: Family Medicine

## 2023-01-29 ENCOUNTER — Encounter: Payer: Self-pay | Admitting: Family Medicine

## 2023-01-29 VITALS — BP 134/92 | HR 88 | Ht 72.0 in | Wt 232.0 lb

## 2023-01-29 DIAGNOSIS — E1159 Type 2 diabetes mellitus with other circulatory complications: Secondary | ICD-10-CM | POA: Diagnosis not present

## 2023-01-29 DIAGNOSIS — Z125 Encounter for screening for malignant neoplasm of prostate: Secondary | ICD-10-CM | POA: Diagnosis not present

## 2023-01-29 DIAGNOSIS — I152 Hypertension secondary to endocrine disorders: Secondary | ICD-10-CM

## 2023-01-29 DIAGNOSIS — E785 Hyperlipidemia, unspecified: Secondary | ICD-10-CM

## 2023-01-29 DIAGNOSIS — M1A0690 Idiopathic chronic gout, unspecified knee, without tophus (tophi): Secondary | ICD-10-CM

## 2023-01-29 DIAGNOSIS — Z6831 Body mass index (BMI) 31.0-31.9, adult: Secondary | ICD-10-CM

## 2023-01-29 DIAGNOSIS — E1169 Type 2 diabetes mellitus with other specified complication: Secondary | ICD-10-CM

## 2023-01-29 DIAGNOSIS — E6609 Other obesity due to excess calories: Secondary | ICD-10-CM | POA: Insufficient documentation

## 2023-01-29 DIAGNOSIS — E66811 Obesity, class 1: Secondary | ICD-10-CM | POA: Insufficient documentation

## 2023-01-29 DIAGNOSIS — Z7984 Long term (current) use of oral hypoglycemic drugs: Secondary | ICD-10-CM

## 2023-01-29 DIAGNOSIS — E1165 Type 2 diabetes mellitus with hyperglycemia: Secondary | ICD-10-CM | POA: Diagnosis not present

## 2023-01-29 LAB — POCT GLYCOSYLATED HEMOGLOBIN (HGB A1C): Hemoglobin A1C: 9.3 % — AB (ref 4.0–5.6)

## 2023-01-29 MED ORDER — GLIPIZIDE 10 MG PO TABS: 10.0000 mg | ORAL_TABLET | Freq: Two times a day (BID) | ORAL | 0 refills | Status: DC

## 2023-01-29 NOTE — Assessment & Plan Note (Signed)
Chronic, stable without flare Repeat uric acid levels

## 2023-01-29 NOTE — Assessment & Plan Note (Signed)
Denies LUTS; recommend PSA in place of DRE. If PSA is elevated for age, we will repeat; if PSA remains elevated pt will be referred to urology for DRE and next steps for best treatment.  On Cialsis 20 mg daily

## 2023-01-29 NOTE — Assessment & Plan Note (Signed)
Chronic, now on crestor 20 mg Repeat LP; pt reports fasting state today The 10-year ASCVD risk score (Arnett DK, et al., 2019) is: 36.1% recommend diet low in saturated fat and regular exercise - 30 min at least 5 times per week LDL goal remains <70 without known CAD/ASCVD at this time.

## 2023-01-29 NOTE — Progress Notes (Signed)
Established patient visit   Patient: Chase Hull   DOB: November 14, 1965   57 y.o. Male  MRN: [id number] Visit Date: 01/29/2023  Today's healthcare provider: [name], FNP  Re Introduced to nurse practitioner role and practice setting.  All questions answered.  Discussed provider/patient relationship and expectations.  Subjective    HPI   Presents for HTN and DM follow up. Reports that he over-indulged on thanksgiving day meals.  Medications: Outpatient Medications Prior to Visit  Medication Sig   allopurinol (ZYLOPRIM) 100 MG tablet TAKE 2 TABLETS BY MOUTH EVERY DAY   amLODipine (NORVASC) 10 MG tablet Take 1 tablet (10 mg total) by mouth daily.   aspirin 81 MG tablet Take 81 mg by mouth daily.   Aspirin-Salicylamide-Caffeine (BC HEADACHE PO) Take 1 packet by mouth daily as needed (headaches).   Blood Glucose Monitoring Suppl (FIFTY50 GLUCOSE METER 2.0) w/Device KIT Use to check fasting blood sugars daily   Blood Glucose Monitoring Suppl (ONE TOUCH ULTRA 2) w/Device KIT 1 each by Does not apply route daily.   glucose blood (ONETOUCH ULTRA) test strip 1 each by Other route daily.   lisinopril (ZESTRIL) 40 MG tablet Take 1 tablet (40 mg total) by mouth daily.   metFORMIN (GLUCOPHAGE-XR) 500 MG 24 hr tablet Take 2 tablets (1,000 mg total) by mouth 2 (two) times daily with a meal.   methocarbamol (ROBAXIN) 500 MG tablet Take 1 tablet (500 mg total) by mouth every 8 (eight) hours as needed for muscle spasms. This can make you sleepy.   Multiple Vitamin (MULTIVITAMIN) tablet Take 1 tablet by mouth daily.   OneTouch Delica Lancets 33G MISC 1 each by Does not apply route daily.   rosuvastatin (CRESTOR) 20 MG tablet Take 1 tablet (20 mg total) by mouth daily.   tadalafil (CIALIS) 20 MG tablet Take 1 tablet (20 mg total) by mouth daily.   [DISCONTINUED] glipiZIDE (GLUCOTROL) 10 MG tablet Take 1 tablet (10 mg total) by mouth daily. Take before largest meal   No facility-administered  medications prior to visit.   Last CBC Lab Results  Component Value Date   WBC 9.6 05/01/2022   HGB 15.4 05/01/2022   HCT 44.2 05/01/2022   MCV 94 05/01/2022   MCH 32.7 05/01/2022   RDW 11.5 (L) 05/01/2022   PLT 235 05/01/2022   Last metabolic panel Lab Results  Component Value Date   GLUCOSE 216 (H) 07/19/2022   NA 137 07/19/2022   K 3.4 (L) 07/19/2022   CL 98 07/19/2022   CO2 23 07/19/2022   BUN 11 07/19/2022   CREATININE 0.79 07/19/2022   EGFR 106 07/19/2022   CALCIUM 9.7 07/19/2022   PROT 6.8 07/19/2022   ALBUMIN 4.3 07/19/2022   LABGLOB 2.5 07/19/2022   AGRATIO 1.7 07/19/2022   BILITOT 0.6 07/19/2022   ALKPHOS 148 (H) 07/19/2022   AST 23 07/19/2022   ALT 22 07/19/2022   ANIONGAP 10 07/28/2020   Last lipids Lab Results  Component Value Date   CHOL 195 07/19/2022   HDL 39 (L) 07/19/2022   LDLCALC 118 (H) 07/19/2022   TRIG 218 (H) 07/19/2022   CHOLHDL 5.0 07/19/2022   Last hemoglobin A1c Lab Results  Component Value Date   HGBA1C 9.3 (A) 01/29/2023   Last thyroid functions Lab Results  Component Value Date   TSH 1.110 05/01/2022     Objective    BP (!) 134/92 (BP Location: Left Arm, Patient Position: Sitting, Cuff Size: Normal)  Pulse 88   Ht 6' (1.829 m)   Wt 232 lb (105.2 kg)   SpO2 99%   BMI 31.46 kg/m   BP Readings from Last 3 Encounters:  01/29/23 (!) 134/92  01/02/23 130/84  11/26/22 134/84   Wt Readings from Last 3 Encounters:  01/29/23 232 lb (105.2 kg)  01/02/23 232 lb (105.2 kg)  11/26/22 233 lb (105.7 kg)   Physical Exam Vitals and nursing note reviewed.  Constitutional:      Appearance: Normal appearance. He is obese.  HENT:     Head: Normocephalic and atraumatic.  Cardiovascular:     Rate and Rhythm: Normal rate and regular rhythm.     Pulses: Normal pulses.     Heart sounds: Normal heart sounds.  Pulmonary:     Effort: Pulmonary effort is normal.     Breath sounds: Normal breath sounds.  Musculoskeletal:         General: Normal range of motion.     Cervical back: Normal range of motion.     Right lower leg: No edema.     Left lower leg: No edema.  Skin:    General: Skin is warm and dry.     Capillary Refill: Capillary refill takes less than 2 seconds.  Neurological:     General: No focal deficit present.     Mental Status: He is alert and oriented to person, place, and time. Mental status is at baseline.  Psychiatric:        Mood and Affect: Mood normal.        Behavior: Behavior normal.        Thought Content: Thought content normal.        Judgment: Judgment normal.     Results for orders placed or performed in visit on 01/29/23  POCT HgB A1C  Result Value Ref Range   Hemoglobin A1C 9.3 (A) 4.0 - 5.6 %   HbA1c POC (<> result, manual entry)     HbA1c, POC (prediabetic range)     HbA1c, POC (controlled diabetic range)      Assessment & Plan     Problem List Items Addressed This Visit       Cardiovascular and Mediastinum   Hypertension associated with diabetes (HCC) - Primary    Chronic, previously elevated Goal remains 119/79 Referral to pharm to assist with medication next steps -discussion of BID start hydral vs coreg to assist  Continue norvasc 10 mg daily, with lisinopril 40 mg daily. Previous use of diuretic deferred given hx of gout.  Repeat CBC, CMP, TSH      Relevant Medications   glipiZIDE (GLUCOTROL) 10 MG tablet   Other Relevant Orders   POCT HgB A1C (Completed)   Comprehensive Metabolic Panel (CMET)   CBC with Differential/Platelet   TSH   Lipid panel   AMB Referral VBCI Care Management     Endocrine   Hyperlipidemia associated with type 2 diabetes mellitus (HCC)    Chronic, now on crestor 20 mg Repeat LP; pt reports fasting state today The 10-year ASCVD risk score (Arnett DK, et al., 2019) is: 36.1% recommend diet low in saturated fat and regular exercise - 30 min at least 5 times per week LDL goal remains <70 without known CAD/ASCVD at this time.        Relevant Medications   glipiZIDE (GLUCOTROL) 10 MG tablet   Other Relevant Orders   AMB Referral VBCI Care Management   Type 2 diabetes mellitus with hyperglycemia (HCC)  Chronic, has been stable x 1 occurrence; elevated above goal again today. Pt noted indulgence related to holiday meals; and knows what to do but needs to be dedicated while he still has a choice in the matter  A1c goal remains <7%  Continue to recommend balanced, lower carb meals. Smaller meal size, adding snacks. Choosing water as drink of choice and increasing purposeful exercise.  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 Discussed importance of healthy weight management Discussed diet and exercise       Relevant Medications   glipiZIDE (GLUCOTROL) 10 MG tablet   Other Relevant Orders   AMB Referral VBCI Care Management   Encounter for screening prostate specific antigen (PSA) measurement    Denies LUTS; recommend PSA in place of DRE. If PSA is elevated for age, we will repeat; if PSA remains elevated pt will be referred to urology for DRE and next steps for best treatment.  On Cialsis 20 mg daily       Relevant Orders   PSA   Return in about 3 months (around 04/29/2023), or if symptoms worsen or fail to  improve, for T2DM management.     Leilani Merl, FNP, have reviewed all documentation for this visit. The documentation on 01/29/23 for the exam, diagnosis, procedures, and orders are all accurate and complete.  [name], FNP  [facility] [phone] (phone) [phone] (fax)  [facility]

## 2023-01-29 NOTE — Assessment & Plan Note (Signed)
Chronic, with HTN, HLD, T2DM *uncontrolled, gout Body mass index is 31.46 kg/m. Discussed importance of healthy weight management Discussed diet and exercise

## 2023-01-29 NOTE — Assessment & Plan Note (Signed)
Chronic, has been stable x 1 occurrence; elevated above goal again today. Pt noted indulgence related to holiday meals; and knows what to do but needs to be dedicated while he still has a choice in the matter  A1c goal remains <7%  Continue to recommend balanced, lower carb meals. Smaller meal size, adding snacks. Choosing water as drink of choice and increasing purposeful exercise.  Declines start of GLP vs insulin (long lasting) with FBG checks daily  Continue previous medications: Glipizide 10 mg daily; could increase to BID dosing Metformin 1000 mg BID  On ACEi On Statin

## 2023-01-29 NOTE — Assessment & Plan Note (Signed)
Chronic, previously elevated Goal remains 119/79 Referral to pharm to assist with medication next steps -discussion of BID start hydral vs coreg to assist  Continue norvasc 10 mg daily, with lisinopril 40 mg daily. Previous use of diuretic deferred given hx of gout.  Repeat CBC, CMP, TSH

## 2023-01-30 LAB — CBC WITH DIFFERENTIAL/PLATELET
Basophils Absolute: 0 10*3/uL (ref 0.0–0.2)
Basos: 0 %
EOS (ABSOLUTE): 0.1 10*3/uL (ref 0.0–0.4)
Eos: 1 %
Hematocrit: 41.2 % (ref 37.5–51.0)
Hemoglobin: 13.7 g/dL (ref 13.0–17.7)
Immature Grans (Abs): 0 10*3/uL (ref 0.0–0.1)
Immature Granulocytes: 0 %
Lymphocytes Absolute: 1.6 10*3/uL (ref 0.7–3.1)
Lymphs: 23 %
MCH: 30.7 pg (ref 26.6–33.0)
MCHC: 33.3 g/dL (ref 31.5–35.7)
MCV: 92 fL (ref 79–97)
Monocytes Absolute: 0.5 10*3/uL (ref 0.1–0.9)
Monocytes: 7 %
Neutrophils Absolute: 4.8 10*3/uL (ref 1.4–7.0)
Neutrophils: 69 %
Platelets: 254 10*3/uL (ref 150–450)
RBC: 4.46 x10E6/uL (ref 4.14–5.80)
RDW: 11.5 % — ABNORMAL LOW (ref 11.6–15.4)
WBC: 7 10*3/uL (ref 3.4–10.8)

## 2023-01-30 LAB — COMPREHENSIVE METABOLIC PANEL
ALT: 24 [IU]/L (ref 0–44)
AST: 22 [IU]/L (ref 0–40)
Albumin: 4.4 g/dL (ref 3.8–4.9)
Alkaline Phosphatase: 145 [IU]/L — ABNORMAL HIGH (ref 44–121)
BUN/Creatinine Ratio: 11 (ref 9–20)
BUN: 10 mg/dL (ref 6–24)
Bilirubin Total: 0.4 mg/dL (ref 0.0–1.2)
CO2: 22 mmol/L (ref 20–29)
Calcium: 9.9 mg/dL (ref 8.7–10.2)
Chloride: 98 mmol/L (ref 96–106)
Creatinine, Ser: 0.93 mg/dL (ref 0.76–1.27)
Globulin, Total: 2.5 g/dL (ref 1.5–4.5)
Glucose: 333 mg/dL — ABNORMAL HIGH (ref 70–99)
Potassium: 3.5 mmol/L (ref 3.5–5.2)
Sodium: 138 mmol/L (ref 134–144)
Total Protein: 6.9 g/dL (ref 6.0–8.5)
eGFR: 97 mL/min/{1.73_m2} (ref >59)

## 2023-01-30 LAB — LIPID PANEL
Chol/HDL Ratio: 4.5 {ratio} (ref 0.0–5.0)
Cholesterol, Total: 193 mg/dL (ref 100–199)
HDL: 43 mg/dL (ref >39)
LDL Chol Calc (NIH): 101 mg/dL — ABNORMAL HIGH (ref 0–99)
Triglycerides: 292 mg/dL — ABNORMAL HIGH (ref 0–149)
VLDL Cholesterol Cal: 49 mg/dL — ABNORMAL HIGH (ref 5–40)

## 2023-01-30 LAB — TSH: TSH: 0.493 u[IU]/mL (ref 0.450–4.500)

## 2023-01-30 LAB — PSA: Prostate Specific Ag, Serum: 1.4 ng/mL (ref 0.0–4.0)

## 2023-01-31 ENCOUNTER — Telehealth: Payer: Self-pay

## 2023-01-31 NOTE — Progress Notes (Signed)
   Care Guide Note  01/31/2023 Name: XANDYR DELACUEVA MRN: [id number] DOB: [birth year]  Referred by: [name], FNP Reason for referral : Care Coordination (Outreach to schedule with Pharm d )   MCARTHER MALLEY is a 57 y.o. year old male who is a primary care patient of [name], FNP. DERRIC DANDURAND was referred to the pharmacist for assistance related to DM.    An unsuccessful telephone outreach was attempted today to contact the patient who was referred to the pharmacy team for assistance with medication management. Additional attempts will be made to contact the patient.   Penne Lash , RMA     [facility]  [facility] Guide  Direct Dial: [phone]  Website: [email or web address]

## 2023-01-31 NOTE — Progress Notes (Signed)
   Care Guide Note  01/31/2023 Name: Chase Hull MRN: [id number] DOB: 12-07-1965  Referred by: [name], FNP Reason for referral : Care Coordination (Outreach to schedule with Pharm d )   Chase Hull is a 57 y.o. year old male who is a primary care patient of [name], FNP. Chase Hull was referred to the pharmacist for assistance related to DM.    Successful contact was made with the patient to discuss pharmacy services including being ready for the pharmacist to call at least 5 minutes before the scheduled appointment time, to have medication bottles and any blood sugar or blood pressure readings ready for review. The patient agreed to meet with the pharmacist via with the pharmacist via telephone visit on (date/time).  02/13/2023  Penne Lash , RMA     [place]  [facility] Guide  Direct Dial: [phone]  Website: [email or web address]

## 2023-02-01 ENCOUNTER — Other Ambulatory Visit: Payer: Self-pay | Admitting: Family Medicine

## 2023-02-01 MED ORDER — FENOFIBRATE 145 MG PO TABS: 145.0000 mg | ORAL_TABLET | Freq: Every day | ORAL | 3 refills | Status: DC

## 2023-02-01 MED ORDER — ROSUVASTATIN CALCIUM 40 MG PO TABS: 40.0000 mg | ORAL_TABLET | Freq: Every day | ORAL | 3 refills | Status: DC

## 2023-02-01 NOTE — Progress Notes (Signed)
Consistent elevation in glucose and alkaline phosphatase. Cholesterol continues to show elevated fat and bad cholesterol, LDL. The 10-year ASCVD risk score (Arnett DK, et al., 2019) is: 35.2%  Recommend adherence to Crestor 20 with plan increase to 40 mg and to add fenofibrate to assist fats

## 2023-02-04 NOTE — Progress Notes (Unsigned)
Referring Physician:  [name], FNP [address] [place],  [place] [address]  Primary Physician:  [name], FNP  History of Present Illness: 02/04/2023 Chase Hull is here today with a chief complaint of ***   Low back pain that radiates into the the bilateral anterior/lateral legs and stopping at the knee.   [name] has ***no symptoms of cervical myelopathy.  The symptoms are causing a significant impact on the patient's life.   I have utilized the care everywhere function in epic to review the outside records available from external health systems.  Progress Note from Drake Leach, [place] on 01/02/23:   History of Present Illness: Chase Hull has a history of DM, HTN,    He is s/p L3-L4 and L5-S1 decompression with right L4-L5 microdiscectomy on 11/03/20 by Dr. Myer Haff.    Last seen by me on 11/26/22 for intermittent LBP with his primary complaint being constant bilateral anterior thigh pain, left leg > right leg pain. Leg pain > LBP.    He has known lumbar spondylosis with DDD L4-S1, slip L4-L5.   He was given robaxin at his last visit. He is here to review his lumbar MRI.    He has constant LBP with bilateral lateral/anterior leg pain to his knees that is worse with standing and walking. LBP = leg pain. Left leg pain = right leg pain. He has numbness and tingling in his legs. Notices a little weakness in his legs. No relief with robaxin.    Bowel/Bladder Dysfunction: none   He smokes about 10 cigarettes a day x 25 years.    Conservative measures:  Physical therapy: has not participated in   Multimodal medical therapy including regular antiinflammatories: norco, neurontin, prednisone, flexeril  Injections: has not received epidural steroid injections   Past Surgery:  L3-L4 and L5-S1 decompression with right L4-L5 microdiscectomy on 11/03/20 by Dr. Myer Haff  Review of Systems:  A 10 point review of systems is negative, except for the pertinent  positives and negatives detailed in the HPI.  Past Medical History: Past Medical History:  Diagnosis Date   Allergy    Carpal tunnel syndrome    Diabetes mellitus without complication (HCC)    no meds, A1C down   Gout    Hypertension     Past Surgical History: Past Surgical History:  Procedure Laterality Date   COLONOSCOPY WITH PROPOFOL N/A 03/30/2019   Procedure: COLONOSCOPY WITH BIOPSY;  Surgeon: Wyline Mood, MD;  Location: [facility];  Service: Endoscopy;  Laterality: N/A;  Priority 4   COLONOSCOPY WITH PROPOFOL N/A 05/16/2022   Procedure: COLONOSCOPY WITH PROPOFOL;  Surgeon: Wyline Mood, MD;  Location: [facility];  Service: Gastroenterology;  Laterality: N/A;   FRACTURE SURGERY     left ankle    Left hand surgery  03/2018   Carpal tunnel   LUMBAR LAMINECTOMY/DECOMPRESSION MICRODISCECTOMY N/A 08/03/2020   Procedure: L3-4 AND L5-S1 DECOMPRESSION, RIGHT L4-5 MICRODISCECTOMY;  Surgeon: Venetia Night, MD;  Location: [facility] OR[facility];  Service: Neurosurgery;  Laterality: N/A;   POLYPECTOMY N/A 03/30/2019   Procedure: POLYPECTOMY;  Surgeon: Wyline Mood, MD;  Location: [facility];  Service: Endoscopy;  Laterality: N/A;   SPINE SURGERY     TONSILLECTOMY     adniods   TOOTH EXTRACTION      Allergies: Allergies as of 02/05/2023 - Review Complete 01/29/2023  Allergen Reaction Noted   Gabapentin Anaphylaxis 10/21/2019    Medications:  Current Outpatient Medications:  allopurinol (ZYLOPRIM) 100 MG tablet, TAKE 2 TABLETS BY MOUTH EVERY DAY, Disp: 180 tablet, Rfl: 0   amLODipine (NORVASC) 10 MG tablet, Take 1 tablet (10 mg total) by mouth daily., Disp: 90 tablet, Rfl: 3   aspirin 81 MG tablet, Take 81 mg by mouth daily., Disp: , Rfl:    Aspirin-Salicylamide-Caffeine (BC HEADACHE PO), Take 1 packet by mouth daily as needed (headaches)., Disp: , Rfl:    Blood Glucose Monitoring Suppl (FIFTY50 GLUCOSE METER 2.0) w/Device KIT, Use to check fasting blood sugars  daily, Disp: , Rfl:    Blood Glucose Monitoring Suppl (ONE TOUCH ULTRA 2) w/Device KIT, 1 each by Does not apply route daily., Disp: 1 kit, Rfl: 0   fenofibrate (TRICOR) 145 MG tablet, Take 1 tablet (145 mg total) by mouth daily., Disp: 90 tablet, Rfl: 3   glipiZIDE (GLUCOTROL) 10 MG tablet, Take 1 tablet (10 mg total) by mouth 2 (two) times daily before a meal. Frequency change from once daily to twice daily, Disp: 180 tablet, Rfl: 0   glucose blood (ONETOUCH ULTRA) test strip, 1 each by Other route daily., Disp: 100 each, Rfl: 3   lisinopril (ZESTRIL) 40 MG tablet, Take 1 tablet (40 mg total) by mouth daily., Disp: 90 tablet, Rfl: 3   metFORMIN (GLUCOPHAGE-XR) 500 MG 24 hr tablet, Take 2 tablets (1,000 mg total) by mouth 2 (two) times daily with a meal., Disp: 360 tablet, Rfl: 3   methocarbamol (ROBAXIN) 500 MG tablet, Take 1 tablet (500 mg total) by mouth every 8 (eight) hours as needed for muscle spasms. This can make you sleepy., Disp: 60 tablet, Rfl: 0   Multiple Vitamin (MULTIVITAMIN) tablet, Take 1 tablet by mouth daily., Disp: , Rfl:    OneTouch Delica Lancets 33G MISC, 1 each by Does not apply route daily., Disp: 100 each, Rfl: 3   rosuvastatin (CRESTOR) 40 MG tablet, Take 1 tablet (40 mg total) by mouth daily., Disp: 90 tablet, Rfl: 3   tadalafil (CIALIS) 20 MG tablet, Take 1 tablet (20 mg total) by mouth daily., Disp: 90 tablet, Rfl: 3  Social History: Social History   Tobacco Use   Smoking status: Some Days    Current packs/day: 0.50    Average packs/day: 0.5 packs/day for 25.0 years (12.5 ttl pk-yrs)    Types: Cigarettes   Smokeless tobacco: Never   Tobacco comments:    No more than 10 a day, per patient.   Vaping Use   Vaping status: Never Used  Substance Use Topics   Alcohol use: Not Currently   Drug use: Never    Family Medical History: No family history on file.  Physical Examination: There were no vitals filed for this visit.  General: Patient is in no apparent  distress. Attention to examination is appropriate.  Neck:   Supple.  Full range of motion.  Respiratory: Patient is breathing without any difficulty.   NEUROLOGICAL:     Awake, alert, oriented to person, place, and time.  Speech is clear and fluent.   Cranial Nerves: Pupils equal round and reactive to light.  Facial tone is symmetric.  Facial sensation is symmetric. Shoulder shrug is symmetric. Tongue protrusion is midline.  There is no pronator drift.  Strength: Side Biceps Triceps Deltoid Interossei Grip Wrist Ext. Wrist Flex.  R 5 5 5 5 5 5 5   L 5 5 5 5 5 5 5    Side Iliopsoas Quads Hamstring PF DF EHL  R 5 5 5 5 5  5  L 5 5 5 5 5 5    Reflexes are ***2+ and symmetric at the biceps, triceps, brachioradialis, patella and achilles.   Hoffman's is absent.   Bilateral upper and lower extremity sensation is intact to light touch.    No evidence of dysmetria noted.  Gait is normal.     Medical Decision Making  Imaging: ***  I have personally reviewed the images and agree with the above interpretation.  Assessment and Plan: Mr. Varda is a pleasant 57 y.o. male with ***    Thank you for involving me in the care of this patient.      Tremond Shimabukuro K. Myer Haff MD, [facility]

## 2023-02-05 ENCOUNTER — Encounter: Payer: Self-pay | Admitting: Neurosurgery

## 2023-02-05 ENCOUNTER — Other Ambulatory Visit: Payer: Self-pay

## 2023-02-05 ENCOUNTER — Ambulatory Visit: Payer: BC Managed Care – PPO | Admitting: Neurosurgery

## 2023-02-05 VITALS — BP 138/90 | Ht 72.0 in | Wt 232.0 lb

## 2023-02-05 DIAGNOSIS — M5416 Radiculopathy, lumbar region: Secondary | ICD-10-CM

## 2023-02-05 DIAGNOSIS — M48062 Spinal stenosis, lumbar region with neurogenic claudication: Secondary | ICD-10-CM

## 2023-02-05 DIAGNOSIS — M5126 Other intervertebral disc displacement, lumbar region: Secondary | ICD-10-CM | POA: Diagnosis not present

## 2023-02-05 DIAGNOSIS — Z01818 Encounter for other preprocedural examination: Secondary | ICD-10-CM

## 2023-02-05 NOTE — Addendum Note (Signed)
Addended by: Sharlot Gowda on: 02/05/2023 01:47 PM   Modules accepted: Orders

## 2023-02-05 NOTE — Patient Instructions (Signed)
  Please see below for information in regards to your upcoming surgery:   Planned surgery: L3-4 posterior spinal decompression, Left L4-5 microdiscectomy   Surgery date: 03/01/23 at [facility] (Medical Mall: [address], [place], [place] [address]) - you will find out your arrival time the business day before your surgery.   Pre-op appointment at [facility] Pre-admit Testing: we will call you with a date/time for this. If you are scheduled for an in person appointment, Pre-admit Testing is located on the first floor of the [facility] building, [address], [address]. Please bring all prescriptions in the original prescription bottles to your appointment. During this appointment, they will advise you which medications you can take the morning of surgery, and which medications you will need to hold for surgery. Labs (such as blood work, EKG) may be done at your pre-op appointment. You are not required to fast for these labs. Should you need to change your pre-op appointment, please call Pre-admit testing at [phone].     Blood thinners:   Aspirin:  stop aspirin 7 days prior, resume aspirin 14 days after     Diabetes/weight loss medications:  Metformin: hold for 2 days prior to surgery     Surgical clearance: we will send a clearance form to Merita Norton, FNP. They may wish to see you in their office prior to signing the clearance form. If so, they may call you to schedule an appointment.     Common restrictions after surgery: No bending, lifting, or twisting ("BLT"). Avoid lifting objects heavier than 10 pounds for the first 6 weeks after surgery. Where possible, avoid household activities that involve lifting, bending, reaching, pushing, or pulling such as laundry, vacuuming, grocery shopping, and childcare. Try to arrange for help from friends and family for these activities while you heal. Do not drive while taking prescription pain  medication. Weeks 6 through 12 after surgery: avoid lifting more than 25 pounds.      How to contact us:  If you have any questions/concerns before or after surgery, you can reach [place] at [phone], or you can send a mychart message. We can be reached by phone or mychart 8am-4pm, Monday-Friday.  *Please note: Calls after 4pm are forwarded to a third party answering service. Mychart messages are not routinely monitored during evenings, weekends, and holidays. Please call our office to contact the answering service for urgent concerns during non-business hours.    If you have FMLA/disability paperwork, please drop it off or fax it to [phone], attention Patty.   Appointments/FMLA & disability paperwork: Joycelyn Rua, & Flonnie Hailstone Registered Nurse/Surgery scheduler: Royston Cowper Medical Assistants: Nash Mantis Physician Assistants: Joan Flores, PA-C, Manning Charity, PA-C & Drake Leach, PA-C Surgeons: Venetia Night, MD & Ernestine Mcmurray, MD

## 2023-02-08 ENCOUNTER — Other Ambulatory Visit: Payer: Self-pay | Admitting: Family Medicine

## 2023-02-12 ENCOUNTER — Telehealth: Payer: Self-pay

## 2023-02-12 NOTE — Telephone Encounter (Signed)
Left message to return call to discuss.  

## 2023-02-12 NOTE — Telephone Encounter (Signed)
-----   Message from [facility] sent at 02/11/2023 10:42 AM EST ----- He'll have to delay surgery.    If he gets it back under control with new meds, we can do the fructosamine test after 3 weeks of good control ----- Message ----- From: Sharlot Gowda, RN Sent: 02/11/2023  10:37 AM EST To: Venetia Night, MD

## 2023-02-12 NOTE — Telephone Encounter (Signed)
Received clearance form from Chase Norton, FNP. Chase Hull's A1C on 01/29/23 was 9.3. Per discussion with Dr Myer Haff, his A1C will need to be 7.5 or less. Alternatively, after 3 weeks of good sugar control, he can go to the lab for a fructosamine test. If the fructosamine test shows significant improvement, we can reschedule his surgery.

## 2023-02-13 ENCOUNTER — Other Ambulatory Visit: Payer: Self-pay | Admitting: Family Medicine

## 2023-02-13 ENCOUNTER — Other Ambulatory Visit: Payer: Self-pay | Admitting: Pharmacist

## 2023-02-13 DIAGNOSIS — E1165 Type 2 diabetes mellitus with hyperglycemia: Secondary | ICD-10-CM

## 2023-02-13 DIAGNOSIS — I152 Hypertension secondary to endocrine disorders: Secondary | ICD-10-CM

## 2023-02-13 MED ORDER — ONETOUCH ULTRA 2 W/DEVICE KIT: 1.0000 | PACK | Freq: Every day | 0 refills | Status: DC

## 2023-02-13 MED ORDER — CARVEDILOL 3.125 MG PO TABS: 3.1250 mg | ORAL_TABLET | Freq: Two times a day (BID) | ORAL | 0 refills | Status: DC

## 2023-02-13 NOTE — Telephone Encounter (Signed)
Left message on pt's voicemail that we will add lab to his PAT appt on 02/22/23 and if the results are appropriate, we will keep him on schedule for 03/01/23.

## 2023-02-13 NOTE — Telephone Encounter (Signed)
Fructosamine test has been added to his pre-op lab orders. He is scheduled for his pre-admit testing appointment on 02/22/23.

## 2023-02-13 NOTE — Telephone Encounter (Signed)
  Pharmacy comment: Alternative Requested:THIS IS NOT COVERED.

## 2023-02-13 NOTE — Progress Notes (Addendum)
02/15/2023 Name: Chase Hull MRN: [id number] DOB: 03-01-1965  Chief Complaint  Patient presents with   Diabetes   Hypertension    Chase Hull is a 57 y.o. year old male who presented for a telephone visit.   They were referred to the pharmacist by their PCP for assistance in managing diabetes and hypertension.    Subjective:  Care Team: Primary Care Provider: [name], FNP ; Next Scheduled Visit: 05/01/23 Clinical Pharmacist: Marlowe Aschoff, PharmD  Medication Access/Adherence  Current Pharmacy:  [facility] [address], [place] - [address] [place] [address] Phone: [phone] Fax: [phone]  [id number] [place] [place] - [address] [place] [address] Phone: [phone] Fax: [phone]   Patient reports affordability concerns with their medications: No  Patient reports access/transportation concerns to their pharmacy: No  Patient reports adherence concerns with their medications:  No    *Discussed that his metformin was last picked up on 09/17/22 so he should have ran out by now- however reports adherence to Rx as prescribed currently   Diabetes:  Current medications: Glipizide 10mg  daily (written for twice a day), Metformin 500mg  (2000mg  max dose daily) Medications tried in the past: None  Current glucose readings: None- hadn't been writing it down Using Ultra 2 meter; testing 1 times daily- usually checking after breakfast Meter is 1-2 years at old at least- requesting a new one   Patient denies hypoglycemic s/sx including dizziness, shakiness, sweating. Patient denies hyperglycemic symptoms including polyuria, polydipsia, polyphagia, nocturia, neuropathy, blurred vision.  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  Patient denies hypertensive symptoms including headache, chest pain, shortness of breath  Current medication access support: BCBS commercial   Objective:  Lab Results  Component Value Date   HGBA1C 9.3 (A) 01/29/2023    Lab Results  Component Value Date   CREATININE 0.93 01/29/2023   BUN 10 01/29/2023   NA 138 01/29/2023   K 3.5 01/29/2023   CL 98 01/29/2023   CO2 22 01/29/2023    Lab Results  Component Value Date   CHOL 193 01/29/2023   HDL 43 01/29/2023   LDLCALC 101 (H) 01/29/2023   TRIG 292 (H) 01/29/2023   CHOLHDL 4.5 01/29/2023    Medications Reviewed Today     Reviewed by Pollie Friar, RPH (Pharmacist) on 02/13/23 at  1253  Med List Status: <None>   Medication Order Taking? Sig Documenting Provider Last Dose Status Informant  allopurinol (ZYLOPRIM) 100 MG tablet [id number] Yes TAKE 2 TABLETS BY MOUTH EVERY DAY [name], FNP Taking Active Self  amLODipine (NORVASC) 10 MG tablet [id number] Yes Take 1 tablet (10 mg total) by mouth daily. [name], FNP  Taking Active Self  aspirin 81 MG tablet [id number] Yes Take 81 mg by mouth daily. [provider] Taking Active Self  Aspirin-Salicylamide-Caffeine (BC HEADACHE PO) [id number] Yes Take 1 packet by mouth daily as needed (headaches). [provider] Taking Active Self  Blood Glucose Monitoring Suppl (FIFTY50 GLUCOSE METER 2.0) w/Device KIT [id number] No Use to check fasting blood sugars daily  Patient not taking: Reported on 02/13/2023   [provider] Not Taking Active Self  Blood Glucose Monitoring Suppl (ONE TOUCH ULTRA 2) w/Device KIT [id number] Yes 1 each by Does not apply route daily. Olevia Perches P, DO Taking Active Self  fenofibrate (TRICOR) 145 MG tablet [id number] Yes Take 1 tablet (145 mg total) by mouth daily. [name], FNP Taking Active Self  glipiZIDE (GLUCOTROL) 10 MG tablet [id number] Yes Take 1 tablet (10 mg total) by mouth 2 (two) times daily before a meal. Frequency change from once daily to twice daily  Patient taking differently: Take 10 mg by mouth daily before breakfast.   [name], FNP Taking Active Self           Med Note Gunnar Fusi, MELISSA R   Wed Feb 13, 2023 11:20 AM)    glucose blood (ONETOUCH ULTRA) test strip [id number] Yes 1 each by Other route daily. Olevia Perches P, DO Taking Active Self  lisinopril (ZESTRIL) 40 MG tablet [id number] Yes Take 1 tablet (40 mg total) by mouth daily. [name], FNP Taking Active Self  metFORMIN (GLUCOPHAGE-XR) 500 MG 24 hr tablet [id number] Yes Take 2 tablets (1,000 mg total) by mouth 2 (two) times daily with a meal. [name], FNP Taking Active Self  OneTouch Delica Lancets 33G MISC [id number] Yes 1 each by Does not apply route daily. Olevia Perches P, DO Taking Active Self  oxymetazoline (AFRIN) 0.05 % nasal spray [id number] Yes Place 1 spray into both nostrils 2 (two) times daily as needed for congestion. [provider] Taking Active Self  rosuvastatin (CRESTOR) 40 MG tablet  [id number]  Take 1 tablet (40 mg total) by mouth daily. [name], FNP  Active Self  tadalafil (CIALIS) 20 MG tablet [id number] Yes Take 1 tablet (20 mg total) by mouth daily.  Patient taking differently: Take 20 mg by mouth daily as needed for erectile dysfunction.   [name], FNP Taking Active Self              Assessment/Plan:   Diabetes: - Currently uncontrolled - Reviewed long term cardiovascular and renal outcomes of uncontrolled blood sugar - Reviewed goal A1c, goal fasting, and goal 2 hour post prandial glucose - Reviewed dietary modifications including switching toast in the morning to whole wheat instead and minimize oatmeal consumption - Recommend to check glucose daily before breakfast and write it down - No medication changes today as his diet makes his recent readings look controlled which is how he ate when A1c was 6.8% prior- reported 95-110 fasting    Hypertension: - Currently uncontrolled - Reviewed long term cardiovascular and renal outcomes of uncontrolled blood pressure - Reviewed appropriate blood pressure monitoring technique and reviewed goal blood pressure.  Recommended to check home blood pressure and heart rate daily - Recommend to start Carvedilol as prescribed and write down BP readings at 12-1 PM daily     Follow Up Plan:  - Follow-up call on 02/22/23 to get new BP and BG readings + started Carvedilol - START Carvedilol 3.125mg  twice a day- send in new Carvedilol for 90DS  - Send new meter covered by insurance- Rx sent for OneTouch Ultra 2 meter - Advised to call Neurology back as they were going to cancel his spinal surgery due to elevated A1c recently  02/15/23 Update: - Pharmacy reports the OneTouch meter was NOT covered by insurance- cost of $31.49 - Sent in new Rx for [employer] instead as this should be covered hopefully- will follow-up later today - From later: Said they did NOT receive a script for Accu-Chek Guide today-  therefore will open another note and send it again  Marlowe Aschoff, PharmD [facility] Phone Number: [phone]

## 2023-02-13 NOTE — Telephone Encounter (Signed)
I spoke with Chase Hull. He saw his PCP on 01/29/23 and she increased his glipizide to twice per day. He has also changed his diet and he reports his sugars have been ranging from 95-100 since 01/29/23.

## 2023-02-14 ENCOUNTER — Ambulatory Visit: Payer: BC Managed Care – PPO | Admitting: Orthopedic Surgery

## 2023-02-14 NOTE — Telephone Encounter (Signed)
Please advise 

## 2023-02-15 ENCOUNTER — Telehealth: Payer: Self-pay | Admitting: Pharmacist

## 2023-02-15 DIAGNOSIS — E1165 Type 2 diabetes mellitus with hyperglycemia: Secondary | ICD-10-CM

## 2023-02-15 MED ORDER — ACCU-CHEK GUIDE ME W/DEVICE KIT: PACK | 0 refills | Status: DC

## 2023-02-15 NOTE — Addendum Note (Signed)
Addended by: Pollie Friar on: 02/15/2023 09:48 AM   Modules accepted: Orders

## 2023-02-15 NOTE — Progress Notes (Signed)
   02/15/2023  Patient ID: Chase Hull, male   DOB: 1965-03-21, 57 y.o.   MRN: [id number]  Rx for Accu-Chek Guide sent again since pharmacy said they did NOT receive the script.    Marlowe Aschoff, PharmD [facility] Phone Number: [phone]

## 2023-02-16 ENCOUNTER — Other Ambulatory Visit: Payer: Self-pay | Admitting: Family Medicine

## 2023-02-22 ENCOUNTER — Encounter
Admission: RE | Admit: 2023-02-22 | Discharge: 2023-02-22 | Disposition: A | Discharge status: Home / Self Care | Payer: BC Managed Care – PPO | Source: Ambulatory Visit | Attending: Neurosurgery | Admitting: Neurosurgery

## 2023-02-22 ENCOUNTER — Other Ambulatory Visit: Payer: Self-pay | Admitting: Pharmacist

## 2023-02-22 ENCOUNTER — Other Ambulatory Visit: Payer: Self-pay

## 2023-02-22 VITALS — BP 128/83 | HR 84 | Temp 98.0°F | Resp 12 | Ht 72.0 in | Wt 231.0 lb

## 2023-02-22 DIAGNOSIS — Z01818 Encounter for other preprocedural examination: Secondary | ICD-10-CM | POA: Diagnosis not present

## 2023-02-22 DIAGNOSIS — E1159 Type 2 diabetes mellitus with other circulatory complications: Secondary | ICD-10-CM | POA: Diagnosis not present

## 2023-02-22 DIAGNOSIS — R9431 Abnormal electrocardiogram [ECG] [EKG]: Secondary | ICD-10-CM | POA: Diagnosis not present

## 2023-02-22 DIAGNOSIS — I152 Hypertension secondary to endocrine disorders: Secondary | ICD-10-CM | POA: Insufficient documentation

## 2023-02-22 DIAGNOSIS — Z01812 Encounter for preprocedural laboratory examination: Secondary | ICD-10-CM | POA: Diagnosis not present

## 2023-02-22 DIAGNOSIS — E1165 Type 2 diabetes mellitus with hyperglycemia: Secondary | ICD-10-CM

## 2023-02-22 DIAGNOSIS — Z01810 Encounter for preprocedural cardiovascular examination: Secondary | ICD-10-CM | POA: Diagnosis not present

## 2023-02-22 HISTORY — DX: Other intervertebral disc degeneration, lumbosacral region without mention of lumbar back pain or lower extremity pain: M51.379

## 2023-02-22 HISTORY — DX: Hyperlipidemia, unspecified: E78.5

## 2023-02-22 HISTORY — DX: Type 2 diabetes mellitus with hyperglycemia: E11.65

## 2023-02-22 HISTORY — DX: Spinal stenosis, lumbar region without neurogenic claudication: M48.061

## 2023-02-22 LAB — TYPE AND SCREEN
ABO/RH(D): O POS
Antibody Screen: NEGATIVE

## 2023-02-22 LAB — URINALYSIS, COMPLETE (UACMP) WITH MICROSCOPIC
Bacteria, UA: NONE SEEN
Bilirubin Urine: NEGATIVE
Glucose, UA: 50 mg/dL — AB
Hgb urine dipstick: NEGATIVE
Ketones, ur: NEGATIVE mg/dL
Leukocytes,Ua: NEGATIVE
Nitrite: NEGATIVE
Protein, ur: 30 mg/dL — AB
Specific Gravity, Urine: 1.024 (ref 1.005–1.030)
pH: 6 (ref 5.0–8.0)

## 2023-02-22 LAB — SURGICAL PCR SCREEN
MRSA, PCR: NEGATIVE
Staphylococcus aureus: NEGATIVE

## 2023-02-22 MED ORDER — ACCU-CHEK GUIDE TEST VI STRP: ORAL_STRIP | 3 refills | Status: AC

## 2023-02-22 NOTE — Progress Notes (Signed)
02/22/2023 Name: Chase Hull MRN: [id number] DOB: Apr 14, 1965  No chief complaint on file.   Chase Hull is a 57 y.o. year old male who presented for a telephone visit.   They were referred to the pharmacist by their PCP for assistance in managing diabetes and hypertension.    Subjective:  Care Team: Primary Care Provider: [name], FNP ; Next Scheduled Visit: 05/01/23 Clinical Pharmacist: Marlowe Aschoff, PharmD  Medication Access/Adherence  [facility] Pharmacy[facility]  [facility] [address] [place] - [address] [place] [address] Phone: [phone] Fax: [phone]  [id number] [place], [place] - [address] [place] [address] Phone: [phone] Fax: [phone]   Patient reports affordability concerns with their medications: No  Patient reports access/transportation concerns to their pharmacy: No  Patient reports adherence concerns with their medications:  No    *Discussed that his metformin was last picked up on 09/17/22 so he should have ran out by now- however reports adherence to Rx as prescribed currently   Diabetes:  Current medications: Glipizide 10mg  daily (written for twice a day), Metformin 500mg  (2000mg  max dose daily) Medications tried in the past: None  Current glucose readings: None- hadn't been writing it down Using Ultra 2 meter; testing 1 times daily- usually checking after breakfast Meter is 1-2 years at old at least- requesting a new one   Patient denies hypoglycemic s/sx including dizziness, shakiness, sweating. Patient denies hyperglycemic symptoms including polyuria, polydipsia, polyphagia, nocturia, neuropathy, blurred vision.  Current meal patterns:  - Breakfast (10:30-11AM): Was frying few eggs + bread + bacon; now 2 plain white toast + boiled egg; plain oatmeal 2 packs - Lunch: Skips - Supper (8PM): Grilled chicken sandwich, salad, chicken or tuna salad with crackers (eats at work), canned  peas, corn, broccoli (veggies 4 out of 5 days a week) - Snacks: Tries to avoid now; 1 banana - Drinks: Water, diet drinks, black coffee in AM (max of 2 cups= 12 oz) *Removed fried foods (baked only); has changed diet drastically lately since A1c went up  Current physical activity: A lot of walking  Works at [employer] - 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  Patient denies hypertensive symptoms including headache, chest pain, shortness of breath  Current medication access support: BCBS commercial   Objective:  Lab Results  Component Value Date   HGBA1C 9.3 (A) 01/29/2023    Lab Results  Component Value Date   CREATININE 0.93 01/29/2023   BUN 10 01/29/2023   NA 138 01/29/2023   K 3.5 01/29/2023   CL 98 01/29/2023   CO2 22 01/29/2023    Lab Results  Component Value Date   CHOL 193 01/29/2023   HDL 43 01/29/2023   LDLCALC 101 (H) 01/29/2023   TRIG 292 (H) 01/29/2023   CHOLHDL 4.5 01/29/2023    Medications Reviewed Today   Medications were not reviewed in this encounter       Assessment/Plan:   Diabetes: - Currently uncontrolled - Reviewed long term  cardiovascular and renal outcomes of uncontrolled blood sugar - Reviewed goal A1c, goal fasting, and goal 2 hour post prandial glucose - Reviewed dietary modifications including switching toast in the morning to whole wheat instead and minimize oatmeal consumption - Recommend to check glucose daily before breakfast and write it down - No medication  changes today as his diet makes his recent readings look controlled which is how he ate when A1c was 6.8% prior- reported 95-110 fasting    Hypertension: - Currently controlled - Reviewed long term cardiovascular and renal outcomes of uncontrolled blood pressure - Reviewed appropriate blood pressure monitoring technique and reviewed goal blood pressure. Recommended to check home blood pressure and heart rate daily - Since starting Carvedilol twice a day, BP was 128/83 at visit today    Follow Up Plan:  - Follow-up call on 03/22/23 to see how surgery went + BG readings with new meter - Had pre-op visit today in which everything looked good so far- waiting on fructosamine result (shows 2-3 week average rather than 3 month average with A1c) - Called pharmacy and had Accu-Chek Guide meter filled for $10 copay (OneTouch Ultra 2 was $31.49 due to lack of insurance coverage)- patient aware and will pick-up tomorrow; sending script for strips today as well   Marlowe Aschoff, PharmD [facility] Phone Number: [phone]

## 2023-02-22 NOTE — Patient Instructions (Addendum)
Your procedure is scheduled on: Friday, January 3 Report to the Registration Desk on the 1st floor of the [facility] To find out your arrival time, please call [phone] between 1PM - 3PM on: Thursday, January 2 If your arrival time is 6:00 am, do not arrive before that time as the [facility] entrance doors do not open until 6:00 am.  REMEMBER: Instructions that are not followed completely may result in serious medical risk, up to and including death; or upon the discretion of your surgeon and anesthesiologist your surgery may need to be rescheduled.  Do not eat food after midnight the night before surgery.  No gum chewing or hard candies.  You may however, drink water up to 2 hours before you are scheduled to arrive for your surgery. Do not drink anything within 2 hours of your scheduled arrival time.  One week prior to surgery: starting December 27 Stop Anti-inflammatories (NSAIDS) such as Advil, Aleve, Ibuprofen, Motrin, Naproxen, Naprosyn and Aspirin based products such as Excedrin, Goody's Powder, BC Powder. Stop ANY OVER THE COUNTER supplements until after surgery.  You may however, continue to take Tylenol if needed for pain up until the day of surgery.  Aspirin - hold for 7 days before surgery. Last day to take aspirin is Thursday, December 26. Do NOT resume until 14 days AFTER surgery.  Metformin - hold for 2 days before surgery. Last day to take metformin is Tuesday, December 31. Resume AFTER surgery.  Tadalafil (Cialis) - hold for at least 2 days before surgery. Can make blood pressure drop significantly during surgery.  Continue taking all of your other prescription medications up until the day of surgery.  ON THE DAY OF SURGERY ONLY TAKE THESE MEDICATIONS WITH SIPS OF WATER:  Amlodipine Fenofibrate Rosuvastatin (Crestor) Prilosec  No Alcohol for 24 hours before or after surgery.  No Smoking including e-cigarettes for 24 hours before surgery.  No chewable  tobacco products for at least 6 hours before surgery.  No nicotine patches on the day of surgery.  Do not use any "recreational" drugs for at least a week (preferably 2 weeks) before your surgery.  Please be advised that the combination of cocaine and anesthesia may have negative outcomes, up to and including death. If you test positive for cocaine, your surgery will be cancelled.  On the morning of surgery brush your teeth with toothpaste and water, you may rinse your mouth with mouthwash if you wish. Do not swallow any toothpaste or mouthwash.  Use CHG Soap as directed on instruction sheet.  Do not wear jewelry, make-up, hairpins, clips or nail polish.  For welded (permanent) jewelry: bracelets, anklets, waist bands, etc.  Please have this removed prior to surgery.  If it is not removed, there is a chance that hospital personnel will need to cut it off on the day of surgery.  Do not wear lotions, powders, or perfumes.   Do not shave body hair from the neck down 48 hours before surgery.  Contact lenses, hearing aids and dentures may not be worn into surgery.  Do not bring valuables to the hospital. [facility] is not responsible for any missing/lost belongings or valuables.   Notify your doctor if there is any change in your medical condition (cold, fever, infection).  Wear comfortable clothing (specific to your surgery type) to the hospital.  After surgery, you can help prevent lung complications by doing breathing exercises.  Take deep breaths and cough every 1-2 hours.   If  you are being discharged the day of surgery, you will not be allowed to drive home. You will need a responsible individual to drive you home and stay with you for 24 hours after surgery.   If you are taking public transportation, you will need to have a responsible individual with you.  Please call the Pre-admissions Testing Dept. at [phone] if you have any questions about these  instructions.  Surgery Visitation Policy:  Patients having surgery or a procedure may have two visitors.  Children under the age of 36 must have an adult with them who is not the patient.     Pre-operative 5 CHG Bath Instructions   You can play a key role in reducing the risk of infection after surgery. Your skin needs to be as free of germs as possible. You can reduce the number of germs on your skin by washing with CHG (chlorhexidine gluconate) soap before surgery. CHG is an antiseptic soap that kills germs and continues to kill germs even after washing.   DO NOT use if you have an allergy to chlorhexidine/CHG or antibacterial soaps. If your skin becomes reddened or irritated, stop using the CHG and notify one of our RNs at [phone].   Please shower with the CHG soap starting 4 days before surgery using the following schedule:     Please keep in mind the following:  DO NOT shave, including legs and underarms, starting the day of your first shower.   You may shave your face at any point before/day of surgery.  Place clean sheets on your bed the day you start using CHG soap. Use a clean washcloth (not used since being washed) for each shower. DO NOT sleep with pets once you start using the CHG.   CHG Shower Instructions:  If you choose to wash your hair and private area, wash first with your normal shampoo/soap.  After you use shampoo/soap, rinse your hair and body thoroughly to remove shampoo/soap residue.  Turn the water OFF and apply about 3 tablespoons (45 ml) of CHG soap to a CLEAN washcloth.  Apply CHG soap ONLY FROM YOUR NECK DOWN TO YOUR TOES (washing for 3-5 minutes)  DO NOT use CHG soap on face, private areas, open wounds, or sores.  Pay special attention to the area where your surgery is being performed.  If you are having back surgery, having someone wash your back for you may be helpful. Wait 2 minutes after CHG soap is applied, then you may rinse off the CHG soap.   Pat dry with a clean towel  Put on clean clothes/pajamas   If you choose to wear lotion, please use ONLY the CHG-compatible lotions on the back of this paper.     Additional instructions for the day of surgery: DO NOT APPLY any lotions, deodorants, cologne, or perfumes.   Put on clean/comfortable clothes.  Brush your teeth.  Ask your nurse before applying any prescription medications to the skin.      CHG Compatible Lotions   Aveeno Moisturizing lotion  Cetaphil Moisturizing Cream  Cetaphil Moisturizing Lotion  Clairol Herbal Essence Moisturizing Lotion, Dry Skin  Clairol Herbal Essence Moisturizing Lotion, Extra Dry Skin  Clairol Herbal Essence Moisturizing Lotion, Normal Skin  Curel Age Defying Therapeutic Moisturizing Lotion with Alpha Hydroxy  Curel Extreme Care Body Lotion  Curel Soothing Hands Moisturizing Hand Lotion  Curel Therapeutic Moisturizing Cream, Fragrance-Free  Curel Therapeutic Moisturizing Lotion, Fragrance-Free  Curel Therapeutic Moisturizing Lotion, Original Formula  Eucerin Daily  Replenishing Lotion  Eucerin Dry Skin Therapy Plus Alpha Hydroxy Crme  Eucerin Dry Skin Therapy Plus Alpha Hydroxy Lotion  Eucerin Original Crme  Eucerin Original Lotion  Eucerin Plus Crme Eucerin Plus Lotion  Eucerin TriLipid Replenishing Lotion  Keri Anti-Bacterial Hand Lotion  Keri Deep Conditioning Original Lotion Dry Skin Formula Softly Scented  Keri Deep Conditioning Original Lotion, Fragrance Free Sensitive Skin Formula  Keri Lotion Fast Absorbing Fragrance Free Sensitive Skin Formula  Keri Lotion Fast Absorbing Softly Scented Dry Skin Formula  Keri Original Lotion  Keri Skin Renewal Lotion Keri Silky Smooth Lotion  Keri Silky Smooth Sensitive Skin Lotion  Nivea Body Creamy Conditioning Oil  Nivea Body Extra Enriched Teacher, adult education Moisturizing Lotion Nivea Crme  Nivea Skin Firming Lotion  NutraDerm 30 Skin Lotion   NutraDerm Skin Lotion  NutraDerm Therapeutic Skin Cream  NutraDerm Therapeutic Skin Lotion  ProShield Protective Hand Cream  Provon moisturizing lotion

## 2023-02-23 LAB — FRUCTOSAMINE: Fructosamine: 303 umol/L — ABNORMAL HIGH (ref 0–285)

## 2023-02-26 ENCOUNTER — Telehealth: Payer: Self-pay

## 2023-02-26 DIAGNOSIS — Z01818 Encounter for other preprocedural examination: Secondary | ICD-10-CM

## 2023-02-26 DIAGNOSIS — E1165 Type 2 diabetes mellitus with hyperglycemia: Secondary | ICD-10-CM

## 2023-02-26 NOTE — Telephone Encounter (Signed)
 Chase Hull's fructosamine test result is elevated. Per discussion with Dr Clois, he will need a repeat A1C 3 months from the last one, and surgery will need to be postponed until 2 weeks after the repeat A1C (on the condition that his A1C is 7.5 or less). This would put his next A1C after 04/29/23 and surgery can be 05/15/23 or after.  Repeat A1C has been ordered to be done on 04/30/23. Surgery has been rescheduled to 05/15/23.  I discussed the above with Chase Hull. I encouraged him to continue to work with his PCP on blood sugar control. He verbalized understanding.

## 2023-02-26 NOTE — Telephone Encounter (Addendum)
 Per discussion with [name], RN in pre-admit testing, he can have a phone interview close to the new surgery date. He will need a repeat type and screen (which is only valid for 14 days), and a repeat A1C. Therefore, they will have him come to their office for a lab draw on 05/01/23 instead of 04/30/23 so the type and screen will not be expired for surgery. He is seeing his PCP on 05/01/23. He is aware that the type and screen cannot be done through the PCP office (per [facility] with pre-admit testing). I spoke with Chase Hull about the updated plan.

## 2023-03-14 ENCOUNTER — Encounter: Payer: BC Managed Care – PPO | Admitting: Orthopedic Surgery

## 2023-03-22 ENCOUNTER — Other Ambulatory Visit: Payer: Self-pay | Admitting: Pharmacist

## 2023-03-22 NOTE — Progress Notes (Unsigned)
03/22/2023 Name: Chase Hull MRN: [id number] DOB: 09-21-1965  Chief Complaint  Patient presents with   Diabetes    Chase Hull is a 58 y.o. year old male who presented for a telephone visit.   They were referred to the pharmacist by their PCP for assistance in managing diabetes and hypertension.    Subjective:  Care Team: Primary Care Provider: [name], FNP (Inactive) ; Next Scheduled Visit: 05/01/23 Clinical Pharmacist: Marlowe Aschoff, PharmD  Medication Access/Adherence  Current Pharmacy:  [facility] [address] [place] - [address] [place] [address] Phone: [phone] Fax: [phone]  [id number] [place], [place] - [address] [place] [address] Phone: [phone] Fax: [phone]   Patient reports affordability concerns with their medications: No  Patient reports access/transportation concerns to their pharmacy: No  Patient reports adherence concerns with their medications:  No    *Discussed that his metformin was last picked up on 09/17/22 so he should have ran out by now- however reports adherence to Rx as prescribed currently- has two large bottles currently   Diabetes:  Current medications: Glipizide 10mg  twice a day, Metformin 500mg  (2000mg  max dose daily) Medications tried in the past: None  Current glucose readings: Range from 78 to 125 fasting Using Ultra 2 meter; testing daily prior to breakfast   Patient denies hypoglycemic s/sx including dizziness, shakiness, sweating. Patient denies hyperglycemic symptoms including polyuria, polydipsia, polyphagia, nocturia, neuropathy, blurred vision.  Current meal patterns:  - Breakfast (10:30-11AM): Was frying few eggs + bread + bacon; now 2 plain white toast + boiled egg; plain oatmeal 2 packs - Lunch: Skips - Supper (8PM): Grilled chicken sandwich, salad, chicken or tuna salad with crackers (eats at work), canned peas, corn, broccoli (veggies 4 out of  5 days a week) - Snacks: Tries to avoid now; 1 banana - Drinks: Water, diet drinks, black coffee in AM (max of 2 cups= 12 oz) *Removed fried foods (baked only); has changed diet drastically lately since A1c went up  Current physical activity: A lot of walking  Works at [employer] - 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  Patient denies hypertensive symptoms including headache, chest pain, shortness of breath  Current medication access support: BCBS commercial   Objective:  Lab Results  Component Value Date   HGBA1C 9.3 (A) 01/29/2023    Lab Results  Component Value Date   CREATININE 0.93 01/29/2023   BUN 10 01/29/2023   NA 138 01/29/2023   K 3.5 01/29/2023   CL 98 01/29/2023   CO2 22 01/29/2023    Lab Results  Component Value Date   CHOL 193 01/29/2023   HDL 43 01/29/2023   LDLCALC 101 (H) 01/29/2023   TRIG 292 (H) 01/29/2023   CHOLHDL 4.5 01/29/2023    Medications Reviewed Today   Medications were not reviewed in this encounter       Assessment/Plan:   Diabetes: - Currently uncontrolled - Reviewed long term cardiovascular and renal outcomes of uncontrolled blood  sugar - Reviewed goal A1c, goal fasting, and goal 2 hour post prandial glucose - Reviewed dietary modifications including switching toast in the morning to whole wheat instead and minimize oatmeal consumption - Recommend to check glucose daily before breakfast and write it down - No medication changes today as his diet makes his  recent readings look controlled which is how he ate when A1c was 6.8% prior- reported 95-110 fasting    Hypertension: - Currently controlled - Reviewed long term cardiovascular and renal outcomes of uncontrolled blood pressure - Reviewed appropriate blood pressure monitoring technique and reviewed goal blood pressure. Recommended to check home blood pressure and heart rate daily - Since starting Carvedilol twice a day, BP was 128/83 at visit today    Follow Up Plan:  - No follow-up call needed per patient request and satisfaction with current outcomes- task set to review A1c in on 05/02/23 - BG readings appear controlled at current levels and patient is adherent to medications with no concerns - Will ask PCP about fructosamine testing coverage with insurance prior to A1c check again, as he is worried the A1c will still be too high - Strongly encouraged continued adherence to medications going forward in order to get surgery  *Of note, surgeon requires A1c <7.5% at next check to complete surgery   Marlowe Aschoff, PharmD [facility] Phone Number: [phone]

## 2023-04-11 ENCOUNTER — Encounter: Payer: BC Managed Care – PPO | Admitting: Neurosurgery

## 2023-04-25 ENCOUNTER — Telehealth: Payer: Self-pay | Admitting: Family Medicine

## 2023-04-25 NOTE — Telephone Encounter (Signed)
 [facility]

## 2023-04-26 ENCOUNTER — Telehealth: Payer: Self-pay | Admitting: Neurosurgery

## 2023-04-26 NOTE — Telephone Encounter (Signed)
 Work note is complete

## 2023-04-26 NOTE — Telephone Encounter (Signed)
*  post op L3-4 posterior spinal decompression, Left L4-5 microdiscectomy on 05/15/23  Patient calling that his job is working 6 days. He can work 5 and still withstand the pain but 6 is really making him hurt. Can he get a letter faxed to his job that he can only work 5 days.   Fax it to [phone] Attn HR

## 2023-05-01 ENCOUNTER — Ambulatory Visit (INDEPENDENT_AMBULATORY_CARE_PROVIDER_SITE_OTHER): Payer: BC Managed Care – PPO | Admitting: Family Medicine

## 2023-05-01 ENCOUNTER — Encounter
Admission: RE | Admit: 2023-05-01 | Discharge: 2023-05-01 | Disposition: A | Discharge status: Home / Self Care | Payer: BC Managed Care – PPO | Source: Ambulatory Visit | Attending: Neurosurgery | Admitting: Neurosurgery

## 2023-05-01 ENCOUNTER — Other Ambulatory Visit: Payer: Self-pay

## 2023-05-01 ENCOUNTER — Encounter: Payer: Self-pay | Admitting: Family Medicine

## 2023-05-01 ENCOUNTER — Ambulatory Visit: Payer: BC Managed Care – PPO | Admitting: Family Medicine

## 2023-05-01 ENCOUNTER — Other Ambulatory Visit: Payer: BC Managed Care – PPO

## 2023-05-01 VITALS — BP 138/94 | HR 90 | Ht 72.0 in | Wt 233.0 lb

## 2023-05-01 VITALS — Ht 72.0 in | Wt 230.0 lb

## 2023-05-01 DIAGNOSIS — E1165 Type 2 diabetes mellitus with hyperglycemia: Secondary | ICD-10-CM | POA: Diagnosis not present

## 2023-05-01 DIAGNOSIS — K219 Gastro-esophageal reflux disease without esophagitis: Secondary | ICD-10-CM | POA: Diagnosis not present

## 2023-05-01 DIAGNOSIS — F17200 Nicotine dependence, unspecified, uncomplicated: Secondary | ICD-10-CM

## 2023-05-01 DIAGNOSIS — J302 Other seasonal allergic rhinitis: Secondary | ICD-10-CM

## 2023-05-01 DIAGNOSIS — G8929 Other chronic pain: Secondary | ICD-10-CM | POA: Insufficient documentation

## 2023-05-01 DIAGNOSIS — E1159 Type 2 diabetes mellitus with other circulatory complications: Secondary | ICD-10-CM

## 2023-05-01 DIAGNOSIS — E785 Hyperlipidemia, unspecified: Secondary | ICD-10-CM | POA: Diagnosis not present

## 2023-05-01 DIAGNOSIS — I152 Hypertension secondary to endocrine disorders: Secondary | ICD-10-CM | POA: Insufficient documentation

## 2023-05-01 DIAGNOSIS — M48061 Spinal stenosis, lumbar region without neurogenic claudication: Secondary | ICD-10-CM | POA: Diagnosis not present

## 2023-05-01 DIAGNOSIS — Z716 Tobacco abuse counseling: Secondary | ICD-10-CM

## 2023-05-01 DIAGNOSIS — J3089 Other allergic rhinitis: Secondary | ICD-10-CM

## 2023-05-01 DIAGNOSIS — Z7984 Long term (current) use of oral hypoglycemic drugs: Secondary | ICD-10-CM

## 2023-05-01 DIAGNOSIS — Z01812 Encounter for preprocedural laboratory examination: Secondary | ICD-10-CM

## 2023-05-01 DIAGNOSIS — M5416 Radiculopathy, lumbar region: Secondary | ICD-10-CM | POA: Diagnosis not present

## 2023-05-01 DIAGNOSIS — E1169 Type 2 diabetes mellitus with other specified complication: Secondary | ICD-10-CM | POA: Diagnosis not present

## 2023-05-01 DIAGNOSIS — T485X5A Adverse effect of other anti-common-cold drugs, initial encounter: Secondary | ICD-10-CM | POA: Insufficient documentation

## 2023-05-01 HISTORY — DX: Hypertension secondary to endocrine disorders: I15.2

## 2023-05-01 HISTORY — DX: Chronic pain syndrome: G89.4

## 2023-05-01 HISTORY — DX: Type 2 diabetes mellitus with other circulatory complications: E11.59

## 2023-05-01 HISTORY — DX: Benign neoplasm of colon, unspecified: D12.6

## 2023-05-01 LAB — CBC
HCT: 41.7 % (ref 39.0–52.0)
Hemoglobin: 14.2 g/dL (ref 13.0–17.0)
MCH: 30.8 pg (ref 26.0–34.0)
MCHC: 34.1 g/dL (ref 30.0–36.0)
MCV: 90.5 fL (ref 80.0–100.0)
Platelets: 280 10*3/uL (ref 150–400)
RBC: 4.61 MIL/uL (ref 4.22–5.81)
RDW: 13.6 % (ref 11.5–15.5)
WBC: 9.6 10*3/uL (ref 4.0–10.5)
nRBC: 0 % (ref 0.0–0.2)

## 2023-05-01 LAB — BASIC METABOLIC PANEL
Anion gap: 9 (ref 5–15)
BUN: 16 mg/dL (ref 6–20)
CO2: 23 mmol/L (ref 22–32)
Calcium: 9.4 mg/dL (ref 8.9–10.3)
Chloride: 105 mmol/L (ref 98–111)
Creatinine, Ser: 0.86 mg/dL (ref 0.61–1.24)
GFR, Estimated: >60 mL/min (ref >60)
Glucose, Bld: 241 mg/dL — ABNORMAL HIGH (ref 70–99)
Potassium: 3.4 mmol/L — ABNORMAL LOW (ref 3.5–5.1)
Sodium: 137 mmol/L (ref 135–145)

## 2023-05-01 LAB — TYPE AND SCREEN
ABO/RH(D): O POS
Antibody Screen: NEGATIVE

## 2023-05-01 LAB — POCT GLYCOSYLATED HEMOGLOBIN (HGB A1C): HbA1c, POC (prediabetic range): 7 % — AB (ref 5.7–6.4)

## 2023-05-01 LAB — URINALYSIS, ROUTINE W REFLEX MICROSCOPIC
Bilirubin Urine: NEGATIVE
Glucose, UA: 50 mg/dL — AB
Hgb urine dipstick: NEGATIVE
Ketones, ur: NEGATIVE mg/dL
Leukocytes,Ua: NEGATIVE
Nitrite: NEGATIVE
Protein, ur: 30 mg/dL — AB
Specific Gravity, Urine: 1.024 (ref 1.005–1.030)
pH: 5 (ref 5.0–8.0)

## 2023-05-01 LAB — SURGICAL PCR SCREEN
MRSA, PCR: NEGATIVE
Staphylococcus aureus: NEGATIVE

## 2023-05-01 MED ORDER — LEVOCETIRIZINE DIHYDROCHLORIDE 5 MG PO TABS: 5.0000 mg | ORAL_TABLET | Freq: Every evening | ORAL | 3 refills | Status: AC

## 2023-05-01 MED ORDER — AZELASTINE HCL 137 MCG/SPRAY NA SOLN: 1.0000 | Freq: Every day | NASAL | 6 refills | Status: DC

## 2023-05-01 MED ORDER — OMEPRAZOLE 40 MG PO CPDR: 40.0000 mg | DELAYED_RELEASE_CAPSULE | Freq: Every day | ORAL | 3 refills | Status: AC

## 2023-05-01 NOTE — Progress Notes (Signed)
 Established patient visit   Patient: Chase Hull   DOB: 02-08-1966   58 y.o. Male  MRN: [id number] Visit Date: 05/01/2023  Today's healthcare provider: Sherlyn Hay, DO   Chief Complaint  Patient presents with   Diabetes    3 month DM f/u. Sinus issue always congested and acid reflux. Wants to see if he could get something it.   Subjective    Diabetes  [name] is a 58 year old male with hypertension and diabetes who presents for management of sinus congestion and diabetes.  He experiences persistent sinus congestion, which he manages with over-the-counter oxymetazoline nasal spray. It has been ongoing since last year. Despite efforts to minimize usage due to concerns about dependency and rebound congestion, he uses it daily. Pseudoephedrine provided minimal relief, and he has not tried other allergy medications like Claritin, Zyrtec, or Xyzal. Congestion occurs year-round, worsening in cold and hot weather. He reports occasional headaches associated with difficulty breathing but no other significant symptoms.  His diabetes is currently well-controlled. He monitors his blood sugar at home, with the highest reading being 120 mg/dL and a recent reading of 109 mg/dL. He has been reducing carbohydrates in his diet and engages in walking for exercise. He is preparing for surgery on March 19 and is focused on maintaining his health prior to the procedure.  Hypertension is being managed with carvedilol, lisinopril and amlodipine, and his blood pressure is currently under control. He is advised to maintain blood pressure under 130/80 mmHg, especially with diabetes.  He experiences severe heartburn, for which he takes over-the-counter omeprazole, 40 mg daily, effectively managing his symptoms. He takes it every morning before his first meal.  He smokes very little and is aware of the benefits of reducing smoking for his diabetes management. He is overdue for an eye exam but plans  to visit [facility] for a check-up. He typically declines COVID vaccinations as he maintains distance from others at work.     Medications: Outpatient Medications Prior to Visit  Medication Sig   allopurinol (ZYLOPRIM) 100 MG tablet TAKE 2 TABLETS BY MOUTH EVERY DAY   amLODipine (NORVASC) 10 MG tablet Take 1 tablet (10 mg total) by mouth daily.   aspirin 81 MG tablet Take 81 mg by mouth daily.   Blood Glucose Monitoring Suppl (ACCU-CHEK GUIDE ME) w/Device KIT 1 kit by Does not apply route daily.   carvedilol (COREG) 3.125 MG tablet Take 1 tablet (3.125 mg total) by mouth 2 (two) times daily with a meal.   fenofibrate (TRICOR) 145 MG tablet Take 1 tablet (145 mg total) by mouth daily.   glipiZIDE (GLUCOTROL) 10 MG tablet Take 1 tablet (10 mg total) by mouth 2 (two) times daily before a meal. Frequency change from once daily to twice daily   glucose blood (ACCU-CHEK GUIDE TEST) test strip Use to check blood glucose daily Dx E11.65   lisinopril (ZESTRIL) 40 MG tablet Take 1 tablet (40 mg total) by mouth daily.   metFORMIN (GLUCOPHAGE-XR) 500 MG 24 hr tablet Take 2 tablets (1,000 mg total) by mouth 2 (two) times daily with a meal.   OneTouch Delica Lancets 33G MISC 1 each by Does not apply route daily.   oxymetazoline (AFRIN) 0.05 % nasal spray Place 1 spray into both nostrils 2 (two) times daily as needed for congestion.   rosuvastatin (CRESTOR) 40 MG tablet Take 1 tablet (40 mg total) by mouth daily.   tadalafil (CIALIS) 20 MG  tablet Take 1 tablet (20 mg total) by mouth daily. (Patient taking differently: Take 20 mg by mouth daily as needed for erectile dysfunction.)   [DISCONTINUED] omeprazole (PRILOSEC) 20 MG capsule Take 40 mg by mouth daily.   No facility-administered medications prior to visit.        Objective    BP (!) 138/94 (BP Location: Left Arm, Patient Position: Sitting, Cuff Size: Large)   Pulse 90   Ht 6' (1.829 m)   Wt 233 lb (105.7 kg)   SpO2 100%   BMI 31.60 kg/m      Physical Exam Constitutional:      Appearance: Normal appearance.  HENT:     Head: Normocephalic and atraumatic.  Eyes:     General: No scleral icterus.    Conjunctiva/sclera: Conjunctivae normal.  Cardiovascular:     Pulses:          Dorsalis pedis pulses are 2+ on the right side and 2+ on the left side.       Posterior tibial pulses are 2+ on the right side and 2+ on the left side.  Musculoskeletal:     Right foot: Normal range of motion. No deformity, bunion, Charcot foot, foot drop or prominent metatarsal heads.     Left foot: Normal range of motion. No deformity, bunion, Charcot foot, foot drop or prominent metatarsal heads.  Feet:     Right foot:     Protective Sensation: 10 sites tested.  10 sites sensed.     Skin integrity: No ulcer, blister, skin breakdown, erythema, warmth, callus, dry skin or fissure.     Toenail Condition: Right toenails are normal.     Left foot:     Protective Sensation: 10 sites tested.  10 sites sensed.     Skin integrity: No ulcer, blister, skin breakdown, erythema, warmth, callus, dry skin or fissure.     Toenail Condition: Left toenails are normal.  Neurological:     Mental Status: He is alert and oriented to person, place, and time. Mental status is at baseline.  Psychiatric:        Mood and Affect: Mood normal.        Behavior: Behavior normal.       Assessment & Plan    Type 2 diabetes mellitus with hyperglycemia, without long-term current use of insulin (HCC) Assessment & Plan: Type 2 Diabetes Mellitus, well-controlled with diet and exercise. Most recent (today) A1c is 7.0%. Discussed goal of reducing A1c below 7.0% and importance of continued lifestyle modifications. - Continue current diet and exercise regimen - Monitor A1c levels regularly - Check uACR today - Follow-up in three months  Orders: -     POCT glycosylated hemoglobin (Hb A1C) -     Microalbumin / creatinine urine ratio  Rhinitis medicamentosa Assessment &  Plan: Chronic sinus congestion likely exacerbated by overuse of oxymetazoline nasal spray, leading to rebound congestion. Symptoms include difficulty breathing, headaches, and persistent congestion. Previous treatments with pseudoephedrine and Flonase were ineffective. Discussed risks of continued Afrin use and benefits of switching to azelastine. Explained that azelastine may be available OTC and combination therapy with Flonase could be an alternative if insurance does not cover azelastine alone. Discussed potential dryness with Xyzal and advised stepping back to Zyrtec or Allegra if necessary. - Discontinue Afrin nasal spray - Prescribe azelastine nasal spray - If azelastine is not covered, prescribe combination of azelastine and Flonase - Recommend starting Zyrtec or Allegra, with Xyzal as an alternative if needed -  Advise taking allergy medication nightly before bed  Orders: -     Azelastine HCl; Place 1-2 sprays into the nose daily.  Dispense: 30 mL; Refill: 6  Perennial allergic rhinitis with seasonal variation Assessment & Plan: Addressed as noted above.   Orders: -     Azelastine HCl; Place 1-2 sprays into the nose daily.  Dispense: 30 mL; Refill: 6 -     Levocetirizine Dihydrochloride; Take 1 tablet (5 mg total) by mouth every evening.  Dispense: 90 tablet; Refill: 3  Gastroesophageal reflux disease, unspecified whether esophagitis present Assessment & Plan: GERD, managed with OTC omeprazole. Patient reports significant relief with 40 mg daily dose. Discussed importance of taking medication at least 30 minutes before the first meal of the day for optimal effectiveness. - Prescribe 90-day supply of omeprazole 40 mg daily - Advise taking medication at least 30 minutes before the first meal of the day  Orders: -     Omeprazole; Take 1 capsule (40 mg total) by mouth daily.  Dispense: 90 capsule; Refill: 3  Hypertension associated with diabetes [facility]) Assessment &  Plan: Hypertension, managed with medication. Recent blood pressure readings are slightly elevated. Discussed goal of maintaining blood pressure under 130/80 mmHg and importance of medication adherence. - Continue carvedilol, lisinopril and amlodipine - Monitor blood pressure regularly - Follow-up in three months   Encounter for smoking cessation counseling Assessment & Plan: Counseling provided. Patient prefers to try to taper usage on his own. Continue to monitor.   Nicotine dependence with current use Assessment & Plan: Addressed as noted above.    General Health Maintenance - Encourage smoking cessation and offer support options such as patches, pills, lozenges, and gum - Recommend eye exam to check for diabetic retinopathy - Discuss COVID-19 vaccination and patient's preference to decline due to limited social interaction   Return in about 3 months (around 08/01/2023) for DM, HTN.      I discussed the assessment and treatment plan with the patient  The patient was provided an opportunity to ask questions and all were answered. The patient agreed with the plan and demonstrated an understanding of the instructions.   The patient was advised to call back or seek an in-person evaluation if the symptoms worsen or if the condition fails to improve as anticipated.    Chase Hay, DO  [facility] [phone] (phone) [phone] (fax)  [facility]

## 2023-05-01 NOTE — Patient Instructions (Addendum)
 Your procedure is scheduled on: Wednesday, March 19 Report to the Registration Desk on the 1st floor of the [facility] To find out your arrival time, please call [phone] between 1PM - 3PM on: Tuesday, March 18 If your arrival time is 6:00 am, do not arrive before that time as the [facility] entrance doors do not open until 6:00 am.  REMEMBER: Instructions that are not followed completely may result in serious medical risk, up to and including death; or upon the discretion of your surgeon and anesthesiologist your surgery may need to be rescheduled.  Do not eat food after midnight the night before surgery.  No gum chewing or hard candies.  You may however, drink water up to 2 hours before you are scheduled to arrive for your surgery. Do not drink anything within 2 hours of your scheduled arrival time.  One week prior to surgery: starting March 12 Stop Anti-inflammatories (NSAIDS) such as Advil, Aleve, Ibuprofen, Motrin, Naproxen, Naprosyn and Aspirin based products such as Excedrin, Goody's Powder, BC Powder. Stop ANY OVER THE COUNTER supplements until after surgery.  You may however, continue to take Tylenol if needed for pain up until the day of surgery.  Aspirin - hold for 7 days before surgery. Do Not resume until 14 days AFTER surgery. AFTER APRIL 2.  tadalafil (CIALIS) - hold for 2 days before surgery.  metFORMIN - hold for 2 days before surgery. Last day to TAKE is Sunday, March 16. Resume AFTER surgery.  Continue taking all of your other prescription medications up until the day of surgery.  ON THE DAY OF SURGERY ONLY TAKE THESE MEDICATIONS WITH SIPS OF WATER:  amLODipine (NORVASC)  carvedilol (COREG)  fenofibrate (TRICOR)  omeprazole (PRILOSEC)  rosuvastatin (CRESTOR)   No Alcohol for 24 hours before or after surgery.  No Smoking including e-cigarettes for 24 hours before surgery.  No chewable tobacco products for at least 6 hours before surgery.  No  nicotine patches on the day of surgery.  Do not use any "recreational" drugs for at least a week (preferably 2 weeks) before your surgery.  Please be advised that the combination of cocaine and anesthesia may have negative outcomes, up to and including death. If you test positive for cocaine, your surgery will be cancelled.  On the morning of surgery brush your teeth with toothpaste and water, you may rinse your mouth with mouthwash if you wish. Do not swallow any toothpaste or mouthwash.  Use CHG Soap as directed on instruction sheet.  Do not wear jewelry, make-up, hairpins, clips or nail polish.  For welded (permanent) jewelry: bracelets, anklets, waist bands, etc.  Please have this removed prior to surgery.  If it is not removed, there is a chance that hospital personnel will need to cut it off on the day of surgery.  Do not wear lotions, powders, or perfumes.   Do not shave body hair from the neck down 48 hours before surgery.  Contact lenses, hearing aids and dentures may not be worn into surgery.  Do not bring valuables to the hospital. [facility] is not responsible for any missing/lost belongings or valuables.   Notify your doctor if there is any change in your medical condition (cold, fever, infection).  Wear comfortable clothing (specific to your surgery type) to the hospital.  After surgery, you can help prevent lung complications by doing breathing exercises.  Take deep breaths and cough every 1-2 hours.   If you are being discharged the day of  surgery, you will not be allowed to drive home. You will need a responsible individual to drive you home and stay with you for 24 hours after surgery.   If you are taking public transportation, you will need to have a responsible individual with you.  Please call the Pre-admissions Testing Dept. at [phone] if you have any questions about these instructions.  Surgery Visitation Policy:  Patients having surgery or a  procedure may have two visitors.  Children under the age of 62 must have an adult with them who is not the patient.  Temporary Visitor Restrictions Due to increasing cases of flu, RSV and COVID-19: Children ages 62 and under will not be able to visit patients in [facility] hospitals under most circumstances.      Pre-operative 5 CHG Bath Instructions   You can play a key role in reducing the risk of infection after surgery. Your skin needs to be as free of germs as possible. You can reduce the number of germs on your skin by washing with CHG (chlorhexidine gluconate) soap before surgery. CHG is an antiseptic soap that kills germs and continues to kill germs even after washing.   DO NOT use if you have an allergy to chlorhexidine/CHG or antibacterial soaps. If your skin becomes reddened or irritated, stop using the CHG and notify one of our RNs at [phone].   Please shower with the CHG soap starting 4 days before surgery using the following schedule:     Please keep in mind the following:  DO NOT shave, including legs and underarms, starting the day of your first shower.   You may shave your face at any point before/day of surgery.  Place clean sheets on your bed the day you start using CHG soap. Use a clean washcloth (not used since being washed) for each shower. DO NOT sleep with pets once you start using the CHG.   CHG Shower Instructions:  If you choose to wash your hair and private area, wash first with your normal shampoo/soap.  After you use shampoo/soap, rinse your hair and body thoroughly to remove shampoo/soap residue.  Turn the water OFF and apply about 3 tablespoons (45 ml) of CHG soap to a CLEAN washcloth.  Apply CHG soap ONLY FROM YOUR NECK DOWN TO YOUR TOES (washing for 3-5 minutes)  DO NOT use CHG soap on face, private areas, open wounds, or sores.  Pay special attention to the area where your surgery is being performed.  If you are having back surgery, having  someone wash your back for you may be helpful. Wait 2 minutes after CHG soap is applied, then you may rinse off the CHG soap.  Pat dry with a clean towel  Put on clean clothes/pajamas   If you choose to wear lotion, please use ONLY the CHG-compatible lotions on the back of this paper.     Additional instructions for the day of surgery: DO NOT APPLY any lotions, deodorants, cologne, or perfumes.   Put on clean/comfortable clothes.  Brush your teeth.  Ask your nurse before applying any prescription medications to the skin.      CHG Compatible Lotions   Aveeno Moisturizing lotion  Cetaphil Moisturizing Cream  Cetaphil Moisturizing Lotion  Clairol Herbal Essence Moisturizing Lotion, Dry Skin  Clairol Herbal Essence Moisturizing Lotion, Extra Dry Skin  Clairol Herbal Essence Moisturizing Lotion, Normal Skin  Curel Age Defying Therapeutic Moisturizing Lotion with Alpha Hydroxy  Curel Extreme Care Body Lotion  Curel Soothing  Hands Moisturizing Hand Lotion  Curel Therapeutic Moisturizing Cream, Fragrance-Free  Curel Therapeutic Moisturizing Lotion, Fragrance-Free  Curel Therapeutic Moisturizing Lotion, Original Formula  Eucerin Daily Replenishing Lotion  Eucerin Dry Skin Therapy Plus Alpha Hydroxy Crme  Eucerin Dry Skin Therapy Plus Alpha Hydroxy Lotion  Eucerin Original Crme  Eucerin Original Lotion  Eucerin Plus Crme Eucerin Plus Lotion  Eucerin TriLipid Replenishing Lotion  Keri Anti-Bacterial Hand Lotion  Keri Deep Conditioning Original Lotion Dry Skin Formula Softly Scented  Keri Deep Conditioning Original Lotion, Fragrance Free Sensitive Skin Formula  Keri Lotion Fast Absorbing Fragrance Free Sensitive Skin Formula  Keri Lotion Fast Absorbing Softly Scented Dry Skin Formula  Keri Original Lotion  Keri Skin Renewal Lotion Keri Silky Smooth Lotion  Keri Silky Smooth Sensitive Skin Lotion  Nivea Body Creamy Conditioning Oil  Nivea Body Extra Enriched Gaffer Moisturizing Lotion Nivea Crme  Nivea Skin Firming Lotion  NutraDerm 30 Skin Lotion  NutraDerm Skin Lotion  NutraDerm Therapeutic Skin Cream  NutraDerm Therapeutic Skin Lotion  ProShield Protective Hand Cream  Provon moisturizing lotion

## 2023-05-01 NOTE — Telephone Encounter (Signed)
 Chase Hull's repeat A1c was 7.0 today. I have faxed a new clearance form to Dr Payton Mccallum.

## 2023-05-02 LAB — MICROALBUMIN / CREATININE URINE RATIO
Creatinine, Urine: 159 mg/dL
Microalb/Creat Ratio: 64 mg/g{creat} — ABNORMAL HIGH (ref 0–29)
Microalbumin, Urine: 101.8 ug/mL

## 2023-05-03 ENCOUNTER — Telehealth: Payer: Self-pay | Admitting: Family Medicine

## 2023-05-03 ENCOUNTER — Other Ambulatory Visit: Payer: Self-pay | Admitting: Family Medicine

## 2023-05-03 NOTE — Telephone Encounter (Signed)
 Copied from CRM [id number]. Topic: Clinical - Medication Refill >> May 03, 2023  2:42 PM Ivette P wrote: Most Recent Primary Care Visit:  Provider: Sherlyn Hay  Department: [facility]  Visit Type: OFFICE VISIT  Date: 05/01/2023  Medication: allopurinol (ZYLOPRIM) 100 MG tablet  Has the patient contacted their pharmacy? Yes (Agent: If no, request that the patient contact the pharmacy for the refill. If patient does not wish to contact the pharmacy document the reason why and proceed with request.) (Agent: If yes, when and what did the pharmacy advise?)  Is this the correct pharmacy for this prescription? Yes If no, delete pharmacy and type the correct one.  This is the patient's preferred pharmacy:  [facility] [address], [place] - [address] [place] [address] Phone: [phone] Fax: [phone]   Has the prescription been filled recently? No  Is the patient out of the medication? Yes  Has the patient been seen for an appointment in the last year OR does the patient have an upcoming appointment? Yes  Can we respond through MyChart? Yes  Agent: Please be advised that Rx refills may take up to 3 business days. We ask that you follow-up with your pharmacy.

## 2023-05-03 NOTE — Telephone Encounter (Signed)
 Duplicate encounter, request already sent to NT pool   Copied from CRM [id number]. Topic: Clinical - Medication Refill >> May 03, 2023  2:42 PM Ivette P wrote: Most Recent Primary Care Visit:  Provider: Sherlyn Hay  Department: [facility]  Visit Type: OFFICE VISIT  Date: 05/01/2023  Medication: allopurinol (ZYLOPRIM) 100 MG tablet  Has the patient contacted their pharmacy? Yes (Agent: If no, request that the patient contact the pharmacy for the refill. If patient does not wish to contact the pharmacy document the reason why and proceed with request.) (Agent: If yes, when and what did the pharmacy advise?)  Is this the correct pharmacy for this prescription? Yes If no, delete pharmacy and type the correct one.  This is the patient's preferred pharmacy:  [facility] [address] [place] - [address] [place] [address] Phone: [phone] Fax: [phone]   Has the prescription been filled recently? No  Is the patient out of the medication? Yes  Has the patient been seen for an appointment in the last year OR does the patient have an upcoming appointment? Yes  Can we respond through MyChart? Yes  Agent: Please be advised that Rx refills may take up to 3 business days. We ask that you follow-up with your pharmacy.

## 2023-05-06 NOTE — Telephone Encounter (Signed)
 Requested medication (s) are due for refill today: yes  Requested medication (s) are on the active medication list: yes  Last refill:  02/18/23 3180  Future visit scheduled: yes  Notes to clinic:  overdue labs   Requested Prescriptions  Pending Prescriptions Disp Refills   allopurinol (ZYLOPRIM) 100 MG tablet 180 tablet 0    Sig: Take 2 tablets (200 mg total) by mouth daily.     Endocrinology:  Gout Agents - allopurinol Failed - 05/06/2023 10:51 AM      Failed - Uric Acid in normal range and within 360 days    Uric Acid  Date Value Ref Range Status  03/21/2022 5.5 3.8 - 8.4 mg/dL Final    Comment:               Therapeutic target for gout patients: <6.0         Passed - Cr in normal range and within 360 days    Creatinine, Ser  Date Value Ref Range Status  05/01/2023 0.86 0.61 - 1.24 mg/dL Final         Passed - Valid encounter within last 12 months    Recent Outpatient Visits           3 months ago Hypertension associated with diabetes [facility])   [facility] Merita Norton T, FNP   6 months ago Type 2 diabetes mellitus with hyperglycemia, without long-term current use of insulin (HCC)   [facility] Merita Norton T, FNP   9 months ago Hypertension associated with diabetes [facility])   [facility] Merita Norton T, FNP   1 year ago Routine general medical examination at a health care facility   [facility] Dorcas Carrow, DO   1 year ago Primary hypertension   [facility] Larae Grooms, NP       Future Appointments             In 2 months Pardue, Monico Blitz, DO [place] [facility]            Passed - CBC within normal limits and completed in the last 12 months    WBC  Date Value Ref Range Status  05/01/2023 9.6 4.0 - 10.5 K/uL Final   RBC  Date Value Ref Range Status  05/01/2023 4.61 4.22 - 5.81 MIL/uL Final    Hemoglobin  Date Value Ref Range Status  05/01/2023 14.2 13.0 - 17.0 g/dL Final  82/95/6213 08.6 13.0 - 17.7 g/dL Final   HCT  Date Value Ref Range Status  05/01/2023 41.7 39.0 - 52.0 % Final   Hematocrit  Date Value Ref Range Status  01/29/2023 41.2 37.5 - 51.0 % Final   MCHC  Date Value Ref Range Status  05/01/2023 34.1 30.0 - 36.0 g/dL Final   [facility]  Date Value Ref Range Status  05/01/2023 30.8 26.0 - 34.0 pg Final   MCV  Date Value Ref Range Status  05/01/2023 90.5 80.0 - 100.0 fL Final  01/29/2023 92 79 - 97 fL Final   No results found for: "PLTCOUNTKUC", "LABPLAT", "POCPLA" RDW  Date Value Ref Range Status  05/01/2023 13.6 11.5 - 15.5 % Final  01/29/2023 11.5 (L) 11.6 - 15.4 % Final

## 2023-05-08 ENCOUNTER — Encounter: Payer: Self-pay | Admitting: Family Medicine

## 2023-05-09 ENCOUNTER — Telehealth: Payer: Self-pay

## 2023-05-09 DIAGNOSIS — F17200 Nicotine dependence, unspecified, uncomplicated: Secondary | ICD-10-CM | POA: Insufficient documentation

## 2023-05-09 DIAGNOSIS — J302 Other seasonal allergic rhinitis: Secondary | ICD-10-CM | POA: Insufficient documentation

## 2023-05-09 DIAGNOSIS — K219 Gastro-esophageal reflux disease without esophagitis: Secondary | ICD-10-CM | POA: Insufficient documentation

## 2023-05-09 DIAGNOSIS — Z716 Tobacco abuse counseling: Secondary | ICD-10-CM | POA: Insufficient documentation

## 2023-05-09 NOTE — Assessment & Plan Note (Addendum)
 Type 2 Diabetes Mellitus, well-controlled with diet and exercise. Most recent (today) A1c is 7.0%. Discussed goal of reducing A1c below 7.0% and importance of continued lifestyle modifications. - Continue current diet and exercise regimen - Monitor A1c levels regularly - Check uACR today - Follow-up in three months

## 2023-05-09 NOTE — Assessment & Plan Note (Signed)
 Addressed as noted above.

## 2023-05-09 NOTE — Telephone Encounter (Signed)
 Chase Hull contacted me via mychart. Surgery has been moved up to 05/13/23.

## 2023-05-09 NOTE — Assessment & Plan Note (Signed)
 GERD, managed with OTC omeprazole. Patient reports significant relief with 40 mg daily dose. Discussed importance of taking medication at least 30 minutes before the first meal of the day for optimal effectiveness. - Prescribe 90-day supply of omeprazole 40 mg daily - Advise taking medication at least 30 minutes before the first meal of the day

## 2023-05-09 NOTE — Telephone Encounter (Signed)
 I contacted Chase Hull to inquire if he would like to move his surgery up to 05/13/23. He would like to discuss this with his work and will let me know by tomorrow morning.

## 2023-05-09 NOTE — Assessment & Plan Note (Signed)
 Counseling provided. Patient prefers to try to taper usage on his own. Continue to monitor.

## 2023-05-09 NOTE — Assessment & Plan Note (Addendum)
 Hypertension, managed with medication. Recent blood pressure readings are slightly elevated. Discussed goal of maintaining blood pressure under 130/80 mmHg and importance of medication adherence. - Continue carvedilol, lisinopril and amlodipine - Monitor blood pressure regularly - Follow-up in three months

## 2023-05-09 NOTE — Assessment & Plan Note (Signed)
 Chronic sinus congestion likely exacerbated by overuse of oxymetazoline nasal spray, leading to rebound congestion. Symptoms include difficulty breathing, headaches, and persistent congestion. Previous treatments with pseudoephedrine and Flonase were ineffective. Discussed risks of continued Afrin use and benefits of switching to azelastine. Explained that azelastine may be available OTC and combination therapy with Flonase could be an alternative if insurance does not cover azelastine alone. Discussed potential dryness with Xyzal and advised stepping back to Zyrtec or Allegra if necessary. - Discontinue Afrin nasal spray - Prescribe azelastine nasal spray - If azelastine is not covered, prescribe combination of azelastine and Flonase - Recommend starting Zyrtec or Allegra, with Xyzal as an alternative if needed - Advise taking allergy medication nightly before bed

## 2023-05-12 MED ORDER — CHLORHEXIDINE GLUCONATE 0.12 % MT SOLN
15.0000 mL | Freq: Once | OROMUCOSAL | Status: AC
  Administered 2023-05-13: 15 mL via OROMUCOSAL

## 2023-05-12 MED ORDER — CEFAZOLIN SODIUM-DEXTROSE 2-4 GM/100ML-% IV SOLN
2.0000 g | INTRAVENOUS | Status: AC
  Administered 2023-05-13: 2 g via INTRAVENOUS

## 2023-05-12 MED ORDER — ORAL CARE MOUTH RINSE: 15.0000 mL | Freq: Once | OROMUCOSAL | Status: AC

## 2023-05-12 MED ORDER — SODIUM CHLORIDE 0.9 % IV SOLN: INTRAVENOUS | Status: DC

## 2023-05-12 MED ORDER — CEFAZOLIN IN SODIUM CHLORIDE 2-0.9 GM/100ML-% IV SOLN
2.0000 g | Freq: Once | INTRAVENOUS | Status: DC
  Filled 2023-05-12: qty 100

## 2023-05-13 ENCOUNTER — Encounter: Payer: Self-pay | Admitting: Neurosurgery

## 2023-05-13 ENCOUNTER — Other Ambulatory Visit: Payer: Self-pay

## 2023-05-13 ENCOUNTER — Encounter
Admission: RE | Disposition: A | Discharge status: Home / Self Care | Payer: Self-pay | Source: Home / Self Care | Attending: Neurosurgery

## 2023-05-13 ENCOUNTER — Ambulatory Visit

## 2023-05-13 ENCOUNTER — Ambulatory Visit
Admission: RE | Admit: 2023-05-13 | Discharge: 2023-05-13 | Disposition: A | Discharge status: Home / Self Care | Payer: BC Managed Care – PPO | Attending: Neurosurgery | Admitting: Neurosurgery

## 2023-05-13 ENCOUNTER — Ambulatory Visit: Admitting: Certified Registered"

## 2023-05-13 ENCOUNTER — Ambulatory Visit: Payer: Self-pay | Admitting: Urgent Care

## 2023-05-13 ENCOUNTER — Telehealth: Payer: Self-pay | Admitting: Neurosurgery

## 2023-05-13 DIAGNOSIS — M4726 Other spondylosis with radiculopathy, lumbar region: Secondary | ICD-10-CM | POA: Diagnosis not present

## 2023-05-13 DIAGNOSIS — M5116 Intervertebral disc disorders with radiculopathy, lumbar region: Secondary | ICD-10-CM | POA: Diagnosis not present

## 2023-05-13 DIAGNOSIS — Z7984 Long term (current) use of oral hypoglycemic drugs: Secondary | ICD-10-CM | POA: Diagnosis not present

## 2023-05-13 DIAGNOSIS — Z0189 Encounter for other specified special examinations: Secondary | ICD-10-CM | POA: Diagnosis not present

## 2023-05-13 DIAGNOSIS — M5416 Radiculopathy, lumbar region: Secondary | ICD-10-CM | POA: Diagnosis not present

## 2023-05-13 DIAGNOSIS — M48062 Spinal stenosis, lumbar region with neurogenic claudication: Secondary | ICD-10-CM | POA: Diagnosis not present

## 2023-05-13 DIAGNOSIS — G9600 Cerebrospinal fluid leak, unspecified: Secondary | ICD-10-CM | POA: Diagnosis not present

## 2023-05-13 DIAGNOSIS — E119 Type 2 diabetes mellitus without complications: Secondary | ICD-10-CM | POA: Insufficient documentation

## 2023-05-13 DIAGNOSIS — I10 Essential (primary) hypertension: Secondary | ICD-10-CM | POA: Insufficient documentation

## 2023-05-13 DIAGNOSIS — Z01818 Encounter for other preprocedural examination: Secondary | ICD-10-CM

## 2023-05-13 DIAGNOSIS — G709 Myoneural disorder, unspecified: Secondary | ICD-10-CM | POA: Insufficient documentation

## 2023-05-13 DIAGNOSIS — F17210 Nicotine dependence, cigarettes, uncomplicated: Secondary | ICD-10-CM | POA: Diagnosis not present

## 2023-05-13 DIAGNOSIS — Z01812 Encounter for preprocedural laboratory examination: Secondary | ICD-10-CM

## 2023-05-13 HISTORY — PX: LUMBAR LAMINECTOMY/DECOMPRESSION MICRODISCECTOMY: SHX5026

## 2023-05-13 LAB — GLUCOSE, CAPILLARY
Glucose-Capillary: 73 mg/dL (ref 70–99)
Glucose-Capillary: 151 mg/dL — ABNORMAL HIGH (ref 70–99)

## 2023-05-13 SURGERY — LUMBAR LAMINECTOMY/DECOMPRESSION MICRODISCECTOMY 2 LEVELS
Anesthesia: General

## 2023-05-13 MED ORDER — GLYCOPYRROLATE 0.2 MG/ML IJ SOLN
INTRAMUSCULAR | Status: DC | PRN
  Administered 2023-05-13 (×2): .1 mg via INTRAVENOUS

## 2023-05-13 MED ORDER — FENTANYL CITRATE (PF) 100 MCG/2ML IJ SOLN
INTRAMUSCULAR | Status: DC | PRN
  Administered 2023-05-13 (×2): 50 ug via INTRAVENOUS

## 2023-05-13 MED ORDER — PHENYLEPHRINE HCL-NACL 20-0.9 MG/250ML-% IV SOLN
INTRAVENOUS | Status: AC
  Filled 2023-05-13: qty 250

## 2023-05-13 MED ORDER — KETOROLAC TROMETHAMINE 30 MG/ML IJ SOLN
INTRAMUSCULAR | Status: DC | PRN
Start: 2023-05-13 — End: 2023-05-13
  Administered 2023-05-13: 30 mg via INTRAVENOUS

## 2023-05-13 MED ORDER — GLYCOPYRROLATE 0.2 MG/ML IJ SOLN
INTRAMUSCULAR | Status: AC
  Filled 2023-05-13: qty 1

## 2023-05-13 MED ORDER — SODIUM CHLORIDE (PF) 0.9 % IJ SOLN
INTRAMUSCULAR | Status: DC | PRN
  Administered 2023-05-13: 20 mL

## 2023-05-13 MED ORDER — METHYLPREDNISOLONE ACETATE 40 MG/ML IJ SUSP
INTRAMUSCULAR | Status: DC | PRN
  Administered 2023-05-13: 40 mg

## 2023-05-13 MED ORDER — BUPIVACAINE HCL (PF) 0.5 % IJ SOLN
INTRAMUSCULAR | Status: AC
  Filled 2023-05-13: qty 90

## 2023-05-13 MED ORDER — SENNA 8.6 MG PO TABS
1.0000 | ORAL_TABLET | Freq: Two times a day (BID) | ORAL | 0 refills | Status: DC | PRN
  Filled 2023-05-13: qty 30, 15d supply, fill #0

## 2023-05-13 MED ORDER — OXYCODONE HCL 5 MG PO TABS: 5.0000 mg | ORAL_TABLET | Freq: Once | ORAL | Status: DC | PRN

## 2023-05-13 MED ORDER — PROPOFOL 10 MG/ML IV BOLUS
INTRAVENOUS | Status: DC | PRN
  Administered 2023-05-13: 150 mg via INTRAVENOUS

## 2023-05-13 MED ORDER — PHENYLEPHRINE HCL-NACL 20-0.9 MG/250ML-% IV SOLN
INTRAVENOUS | Status: DC | PRN
  Administered 2023-05-13: 40 ug/min via INTRAVENOUS

## 2023-05-13 MED ORDER — CHLORHEXIDINE GLUCONATE 0.12 % MT SOLN
OROMUCOSAL | Status: AC
  Filled 2023-05-13: qty 15

## 2023-05-13 MED ORDER — BUPIVACAINE-EPINEPHRINE (PF) 0.5% -1:200000 IJ SOLN
INTRAMUSCULAR | Status: AC
  Filled 2023-05-13: qty 40

## 2023-05-13 MED ORDER — FIBRIN SEALANT 2 ML SINGLE DOSE KIT
PACK | CUTANEOUS | Status: AC
  Filled 2023-05-13: qty 2

## 2023-05-13 MED ORDER — DEXAMETHASONE SODIUM PHOSPHATE 10 MG/ML IJ SOLN
INTRAMUSCULAR | Status: DC | PRN
  Administered 2023-05-13: 10 mg via INTRAVENOUS

## 2023-05-13 MED ORDER — MIDAZOLAM HCL 2 MG/2ML IJ SOLN
INTRAMUSCULAR | Status: AC
  Filled 2023-05-13: qty 2

## 2023-05-13 MED ORDER — FENTANYL CITRATE (PF) 100 MCG/2ML IJ SOLN: 25.0000 ug | INTRAMUSCULAR | Status: DC | PRN

## 2023-05-13 MED ORDER — OXYCODONE HCL 5 MG PO TABS
5.0000 mg | ORAL_TABLET | ORAL | 0 refills | Status: DC | PRN
  Filled 2023-05-13: qty 30, 5d supply, fill #0

## 2023-05-13 MED ORDER — 0.9 % SODIUM CHLORIDE (POUR BTL) OPTIME
TOPICAL | Status: DC | PRN
  Administered 2023-05-13: 500 mL

## 2023-05-13 MED ORDER — PHENYLEPHRINE 80 MCG/ML (10ML) SYRINGE FOR IV PUSH (FOR BLOOD PRESSURE SUPPORT)
PREFILLED_SYRINGE | INTRAVENOUS | Status: AC
  Filled 2023-05-13: qty 10

## 2023-05-13 MED ORDER — PHENYLEPHRINE HCL (PRESSORS) 10 MG/ML IV SOLN
INTRAVENOUS | Status: AC
  Filled 2023-05-13: qty 1

## 2023-05-13 MED ORDER — ACETAMINOPHEN 10 MG/ML IV SOLN
INTRAVENOUS | Status: DC | PRN
  Administered 2023-05-13: 1000 mg via INTRAVENOUS

## 2023-05-13 MED ORDER — PROPOFOL 10 MG/ML IV BOLUS
INTRAVENOUS | Status: AC
  Filled 2023-05-13: qty 20

## 2023-05-13 MED ORDER — SODIUM CHLORIDE (PF) 0.9 % IJ SOLN
INTRAMUSCULAR | Status: AC
  Filled 2023-05-13: qty 60

## 2023-05-13 MED ORDER — SUCCINYLCHOLINE CHLORIDE 200 MG/10ML IV SOSY
PREFILLED_SYRINGE | INTRAVENOUS | Status: DC | PRN
  Administered 2023-05-13: 140 mg via INTRAVENOUS

## 2023-05-13 MED ORDER — PHENYLEPHRINE 80 MCG/ML (10ML) SYRINGE FOR IV PUSH (FOR BLOOD PRESSURE SUPPORT)
PREFILLED_SYRINGE | INTRAVENOUS | Status: DC | PRN
  Administered 2023-05-13: 80 ug via INTRAVENOUS

## 2023-05-13 MED ORDER — FENTANYL CITRATE (PF) 100 MCG/2ML IJ SOLN
INTRAMUSCULAR | Status: AC
  Filled 2023-05-13: qty 2

## 2023-05-13 MED ORDER — SURGIFLO WITH THROMBIN (HEMOSTATIC MATRIX KIT) OPTIME
TOPICAL | Status: DC | PRN
  Administered 2023-05-13: 1 via TOPICAL

## 2023-05-13 MED ORDER — BUPIVACAINE-EPINEPHRINE (PF) 0.5% -1:200000 IJ SOLN
INTRAMUSCULAR | Status: DC | PRN
  Administered 2023-05-13: 6 mL

## 2023-05-13 MED ORDER — OXYCODONE HCL 5 MG/5ML PO SOLN: 5.0000 mg | Freq: Once | ORAL | Status: DC | PRN

## 2023-05-13 MED ORDER — KETAMINE HCL 50 MG/5ML IJ SOSY
PREFILLED_SYRINGE | INTRAMUSCULAR | Status: DC | PRN
  Administered 2023-05-13 (×2): 10 mg via INTRAVENOUS
  Administered 2023-05-13: 20 mg via INTRAVENOUS

## 2023-05-13 MED ORDER — ONDANSETRON HCL 4 MG/2ML IJ SOLN
INTRAMUSCULAR | Status: AC
  Filled 2023-05-13: qty 2

## 2023-05-13 MED ORDER — SUCCINYLCHOLINE CHLORIDE 200 MG/10ML IV SOSY
PREFILLED_SYRINGE | INTRAVENOUS | Status: AC
  Filled 2023-05-13: qty 10

## 2023-05-13 MED ORDER — MIDAZOLAM HCL 2 MG/2ML IJ SOLN
INTRAMUSCULAR | Status: DC | PRN
  Administered 2023-05-13: 2 mg via INTRAVENOUS

## 2023-05-13 MED ORDER — KETOROLAC TROMETHAMINE 30 MG/ML IJ SOLN
INTRAMUSCULAR | Status: AC
  Filled 2023-05-13: qty 1

## 2023-05-13 MED ORDER — BUPIVACAINE LIPOSOME 1.3 % IJ SUSP
INTRAMUSCULAR | Status: AC
  Filled 2023-05-13: qty 20

## 2023-05-13 MED ORDER — DEXMEDETOMIDINE HCL IN NACL 80 MCG/20ML IV SOLN
INTRAVENOUS | Status: DC | PRN
  Administered 2023-05-13: 8 ug via INTRAVENOUS

## 2023-05-13 MED ORDER — METHOCARBAMOL 500 MG PO TABS
500.0000 mg | ORAL_TABLET | Freq: Four times a day (QID) | ORAL | 0 refills | Status: DC
  Filled 2023-05-13: qty 120, 30d supply, fill #0

## 2023-05-13 MED ORDER — ACETAMINOPHEN 10 MG/ML IV SOLN
INTRAVENOUS | Status: AC
  Filled 2023-05-13: qty 100

## 2023-05-13 MED ORDER — METHYLPREDNISOLONE ACETATE 40 MG/ML IJ SUSP
INTRAMUSCULAR | Status: AC
  Filled 2023-05-13: qty 1

## 2023-05-13 MED ORDER — FIBRIN SEALANT 2 ML SINGLE DOSE KIT
PACK | CUTANEOUS | Status: DC | PRN
Start: 2023-05-13 — End: 2023-05-13
  Administered 2023-05-13: 2 mL via TOPICAL

## 2023-05-13 MED ORDER — PROPOFOL 1000 MG/100ML IV EMUL
INTRAVENOUS | Status: AC
  Filled 2023-05-13: qty 100

## 2023-05-13 MED ORDER — LIDOCAINE HCL (PF) 2 % IJ SOLN
INTRAMUSCULAR | Status: AC
  Filled 2023-05-13: qty 5

## 2023-05-13 MED ORDER — LIDOCAINE HCL (CARDIAC) PF 100 MG/5ML IV SOSY
PREFILLED_SYRINGE | INTRAVENOUS | Status: DC | PRN
  Administered 2023-05-13: 100 mg via INTRAVENOUS

## 2023-05-13 MED ORDER — CEFAZOLIN SODIUM-DEXTROSE 2-4 GM/100ML-% IV SOLN
INTRAVENOUS | Status: AC
  Filled 2023-05-13: qty 100

## 2023-05-13 MED ORDER — ONDANSETRON HCL 4 MG/2ML IJ SOLN
INTRAMUSCULAR | Status: DC | PRN
  Administered 2023-05-13: 4 mg via INTRAVENOUS

## 2023-05-13 MED ORDER — KETAMINE HCL 50 MG/5ML IJ SOSY
PREFILLED_SYRINGE | INTRAMUSCULAR | Status: AC
  Filled 2023-05-13: qty 5

## 2023-05-13 SURGICAL SUPPLY — 36 items
BASIN KIT SINGLE STR (MISCELLANEOUS) ×1 IMPLANT
BUR NEURO DRILL SOFT 3.0X3.8M (BURR) ×1 IMPLANT
DERMABOND ADVANCED .7 DNX12 (GAUZE/BANDAGES/DRESSINGS) ×1 IMPLANT
DRAPE C ARM PK CFD 31 SPINE (DRAPES) ×1 IMPLANT
DRAPE LAPAROTOMY 100X77 ABD (DRAPES) ×1 IMPLANT
DRAPE MICROSCOPE SPINE 48X150 (DRAPES) ×1 IMPLANT
DRSG OPSITE POSTOP 3X4 (GAUZE/BANDAGES/DRESSINGS) ×1 IMPLANT
DRSG TEGADERM 4X4.75 (GAUZE/BANDAGES/DRESSINGS) IMPLANT
ELECT EZSTD 165MM 6.5IN (MISCELLANEOUS) ×1 IMPLANT
ELECT REM PT RETURN 9FT ADLT (ELECTROSURGICAL) ×1 IMPLANT
ELECTRODE EZSTD 165MM 6.5IN (MISCELLANEOUS) ×1 IMPLANT
ELECTRODE REM PT RTRN 9FT ADLT (ELECTROSURGICAL) ×1 IMPLANT
GLOVE BIOGEL PI IND STRL 6.5 (GLOVE) ×1 IMPLANT
GLOVE SURG SYN 6.5 ES PF (GLOVE) ×1 IMPLANT
GLOVE SURG SYN 6.5 PF PI (GLOVE) ×1 IMPLANT
GLOVE SURG SYN 8.5 E (GLOVE) ×3 IMPLANT
GLOVE SURG SYN 8.5 PF PI (GLOVE) ×3 IMPLANT
GOWN SRG LRG LVL 4 IMPRV REINF (GOWNS) ×1 IMPLANT
GOWN SRG XL LVL 3 NONREINFORCE (GOWNS) ×1 IMPLANT
GRAFT DURAGEN MATRIX 1WX1L (Tissue) IMPLANT
KIT SPINAL PRONEVIEW (KITS) ×1 IMPLANT
MANIFOLD NEPTUNE II (INSTRUMENTS) ×1 IMPLANT
MARKER SKIN DUAL TIP RULER LAB (MISCELLANEOUS) ×1 IMPLANT
NDL SAFETY ECLIPSE 18X1.5 (NEEDLE) ×1 IMPLANT
NS IRRIG 500ML POUR BTL (IV SOLUTION) ×1 IMPLANT
PACK LAMINECTOMY ARMC (PACKS) ×1 IMPLANT
PAD ARMBOARD POSITIONER FOAM (MISCELLANEOUS) ×1 IMPLANT
SURGIFLO W/THROMBIN 8M KIT (HEMOSTASIS) ×1 IMPLANT
SUT ETHILON 3-0 FS-10 30 BLK (SUTURE) ×1 IMPLANT
SUT STRATA 3-0 15 PS-2 (SUTURE) ×1 IMPLANT
SUT VIC AB 0 CT1 27XCR 8 STRN (SUTURE) ×1 IMPLANT
SUT VIC AB 2-0 CT1 18 (SUTURE) ×1 IMPLANT
SUTURE EHLN 3-0 FS-10 30 BLK (SUTURE) IMPLANT
SYR 30ML LL (SYRINGE) ×2 IMPLANT
SYR 3ML LL SCALE MARK (SYRINGE) ×1 IMPLANT
TRAP FLUID SMOKE EVACUATOR (MISCELLANEOUS) ×1 IMPLANT

## 2023-05-13 NOTE — Telephone Encounter (Signed)
 Patient is aware that the letter has been faxed to [employer].

## 2023-05-13 NOTE — Transfer of Care (Signed)
 Immediate Anesthesia Transfer of Care Note  Patient: Chase Hull  Procedure(s) Performed: L3-4 POSTERIOR SPINAL DECOMPRESSION, LEFT L4-5 MICRODISCECTOMY  Patient Location: PACU  Anesthesia Type:General  Level of Consciousness: drowsy  Airway & Oxygen Therapy: Patient Spontanous Breathing and Patient connected to face mask oxygen  Post-op Assessment: Report given to RN and Post -op Vital signs reviewed and stable  Post vital signs: Reviewed and stable  Last Vitals:  Vitals Value Taken Time  BP 107/72 05/13/23 1002  Temp 36.1 C 05/13/23 1002  Pulse 81 05/13/23 1005  Resp 24 05/13/23 1005  SpO2 94 % 05/13/23 1005  Vitals shown include unfiled device data.  Last Pain:  Vitals:   05/13/23 0615  TempSrc: Temporal  PainSc: 0-No pain         Complications: No notable events documented.

## 2023-05-13 NOTE — Anesthesia Procedure Notes (Signed)
 Procedure Name: Intubation Date/Time: 05/13/2023 7:23 AM  Performed by: Morene Crocker, CRNAPre-anesthesia Checklist: Patient identified, Patient being monitored, Timeout performed, Emergency Drugs available and Suction available Patient Re-evaluated:Patient Re-evaluated prior to induction Oxygen Delivery Method: Circle system utilized Preoxygenation: Pre-oxygenation with 100% oxygen Induction Type: IV induction Ventilation: Mask ventilation without difficulty Laryngoscope Size: 3 and McGrath Grade View: Grade I Tube type: Oral Tube size: 7.5 mm Number of attempts: 1 Airway Equipment and Method: Stylet Placement Confirmation: ETT inserted through vocal cords under direct vision, positive ETCO2 and breath sounds checked- equal and bilateral Secured at: 23 cm Tube secured with: Tape Dental Injury: Teeth and Oropharynx as per pre-operative assessment  Comments: Smooth atraumatic intubation, no complications noted.

## 2023-05-13 NOTE — Discharge Instructions (Addendum)
 Your surgeon has performed an operation on your lumbar spine (low back) to relieve pressure on one or more nerves. Many times, patients feel better immediately after surgery and can "overdo it." Even if you feel well, it is important that you follow these activity guidelines. If you do not let your back heal properly from the surgery, you can increase the chance of a disc herniation and/or return of your symptoms. The following are instructions to help in your recovery once you have been discharged from the hospital.  * It is ok to take NSAIDs after surgery.  Activity    No bending, lifting, or twisting ("BLT"). Avoid lifting objects heavier than 10 pounds (gallon milk jug).  Where possible, avoid household activities that involve lifting, bending, pushing, or pulling such as laundry, vacuuming, grocery shopping, and childcare. Try to arrange for help from friends and family for these activities while your back heals.  Increase physical activity slowly as tolerated.  Taking short walks is encouraged, but avoid strenuous exercise. Do not jog, run, bicycle, lift weights, or participate in any other exercises unless specifically allowed by your doctor. Avoid prolonged sitting, including car rides.  Talk to your doctor before resuming sexual activity.  You should not drive until cleared by your doctor.  Until released by your doctor, you should not return to work or school.  You should rest at home and let your body heal.   You may shower three days after your surgery.  After showering, lightly dab your incision dry. Do not take a tub bath or go swimming for 3 weeks, or until approved by your doctor at your follow-up appointment.  If you smoke, we strongly recommend that you quit.  Smoking has been proven to interfere with normal healing in your back and will dramatically reduce the success rate of your surgery. Please contact [facility] (800-QUIT-NOW) and use the resources at www.[facility].com for  assistance in stopping smoking.  Surgical Incision   If you have a dressing on your incision, you may remove it three days after your surgery. Keep your incision area clean and dry.  If you have staples or stitches on your incision, you should have a follow up scheduled for removal. If you do not have staples or stitches, you will have steri-strips (small pieces of surgical tape) or Dermabond glue. The steri-strips/glue should begin to peel away within about a week (it is fine if the steri-strips fall off before then). If the strips are still in place one week after your surgery, you may gently remove them.  Diet            You may return to your usual diet. Be sure to stay hydrated.  When to Contact us  Although your surgery and recovery will likely be uneventful, you may have some residual numbness, aches, and pains in your back and/or legs. This is normal and should improve in the next few weeks.  However, should you experience any of the following, contact us immediately: New numbness or weakness Pain that is progressively getting worse, and is not relieved by your pain medications or rest Bleeding, redness, swelling, pain, or drainage from surgical incision Chills or flu-like symptoms Fever greater than 101.0 F (38.3 C) Problems with bowel or bladder functions Difficulty breathing or shortness of breath Warmth, tenderness, or swelling in your calf  Contact Information How to contact us:  If you have any questions/concerns before or after surgery, you can reach [place] at [phone], or you can  send a [employer]. We can be reached by phone or mychart 8am-4pm, Monday-Friday.  *Please note: Calls after 4pm are forwarded to a third party answering service. Mychart messages are not routinely monitored during evenings, weekends, and holidays. Please call our office to contact the answering service for urgent concerns during non-business hours.      Information for Discharge  Teaching: EXPAREL (bupivacaine liposome injectable suspension)   Pain relief is important to your recovery. The goal is to control your pain so you can move easier and return to your normal activities as soon as possible after your procedure. Your physician may use several types of medicines to manage pain, swelling, and more.  Your surgeon or anesthesiologist gave you EXPAREL(bupivacaine) to help control your pain after surgery.  EXPAREL is a local anesthetic designed to release slowly over an extended period of time to provide pain relief by numbing the tissue around the surgical site. EXPAREL is designed to release pain medication over time and can control pain for up to 72 hours. Depending on how you respond to EXPAREL, you may require less pain medication during your recovery. EXPAREL can help reduce or eliminate the need for opioids during the first few days after surgery when pain relief is needed the most. EXPAREL is not an opioid and is not addictive. It does not cause sleepiness or sedation.   Important! A teal colored band has been placed on your arm with the date, time and amount of EXPAREL you have received. Please leave this armband in place for the full 96 hours following administration, and then you may remove the band. If you return to the hospital for any reason within 96 hours following the administration of EXPAREL, the armband provides important information that your health care providers to know, and alerts them that you have received this anesthetic.    Possible side effects of EXPAREL: Temporary loss of sensation or ability to move in the area where medication was injected. Nausea, vomiting, constipation Rarely, numbness and tingling in your mouth or lips, lightheadedness, or anxiety may occur. Call your doctor right away if you think you may be experiencing any of these sensations, or if you have other questions regarding possible side effects.  Follow all other  discharge instructions given to you by your surgeon or nurse. Eat a healthy diet and drink plenty of water or other fluids.
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
## 2023-05-13 NOTE — Telephone Encounter (Signed)
 L3-4 posterior spinal decompression, Left L4-5 microdiscectomy on 05/13/23  Patient had surgery today. He states that [employer] is asking for a letter stating the type of surgery he had with today's date and that everything came out fine. I will then fax it to [employer].

## 2023-05-13 NOTE — Telephone Encounter (Signed)
 Note has been written with name of procedure per patient request.

## 2023-05-13 NOTE — Discharge Summary (Signed)
 Discharge Summary  Patient ID: Chase Hull MRN: [id number] DOB/AGE: 30-Nov-1965 58 y.o.  Admit date: 05/13/2023 Discharge date: 05/13/2023  Admission Diagnoses: M54.16 lumbar radiculopathy, M48.062 Neurogenic claudication due to lumbar spinal stenosis   Discharge Diagnoses:  Active Problems:   Lumbar radiculopathy   Discharged Condition: good  Hospital Course:  Chase Hull is a 58 y.o presenting with lumbar radiculopathy and neurogenic claudication s/p left L4-5 redo microdisectomy and L3-4 decompression. His intraoperative course was complicated by a small durotomy and CSF leak which was repaired with duragen and Vistaseal. He was monitored in PACU and discharged home after ambulating, urinating, and tolerating PO intake. He was given prescriptions for Oxycodone, Robaxin, and Senna to take as needed  Consults: None  Significant Diagnostic Studies: NA  Treatments: surgery: as above. Please see separately dictated operative report for further details   Discharge Exam: Blood pressure 124/86, pulse 76, temperature (!) 97 F (36.1 C), resp. rate 20, height 6' (1.829 m), weight 105.7 kg, SpO2 100%. CN grossly intact  MAEW Incision c/d/I with post-op dressing in place.   Disposition: Discharge disposition: 01-Home or Self Care        Allergies as of 05/13/2023       Reactions   Gabapentin Anaphylaxis        Medication List     PAUSE taking these medications    aspirin 81 MG tablet Wait to take this until your doctor or other care provider tells you to start again. Take 81 mg by mouth daily. Notes to patient: Per primary care provider wait 14 days       TAKE these medications    Accu-Chek Guide Me w/Device Kit 1 kit by Does not apply route daily.   Accu-Chek Guide Test test strip Generic drug: glucose blood Use to check blood glucose daily Dx E11.65   allopurinol 100 MG tablet Commonly known as: ZYLOPRIM TAKE 2 TABLETS BY MOUTH EVERY DAY   amLODipine  10 MG tablet Commonly known as: NORVASC Take 1 tablet (10 mg total) by mouth daily.   Azelastine HCl 137 MCG/SPRAY Soln Place 1-2 sprays into the nose daily.   carvedilol 3.125 MG tablet Commonly known as: COREG Take 1 tablet (3.125 mg total) by mouth 2 (two) times daily with a meal.   fenofibrate 145 MG tablet Commonly known as: Tricor Take 1 tablet (145 mg total) by mouth daily.   glipiZIDE 10 MG tablet Commonly known as: GLUCOTROL Take 1 tablet (10 mg total) by mouth 2 (two) times daily before a meal. Frequency change from once daily to twice daily   levocetirizine 5 MG tablet Commonly known as: XYZAL Take 1 tablet (5 mg total) by mouth every evening.   lisinopril 40 MG tablet Commonly known as: ZESTRIL Take 1 tablet (40 mg total) by mouth daily.   metFORMIN 500 MG 24 hr tablet Commonly known as: GLUCOPHAGE-XR Take 2 tablets (1,000 mg total) by mouth 2 (two) times daily with a meal.   methocarbamol 500 MG tablet Commonly known as: ROBAXIN Take 1 tablet (500 mg total) by mouth 4 (four) times daily.   omeprazole 40 MG capsule Commonly known as: PRILOSEC Take 1 capsule (40 mg total) by mouth daily.   OneTouch Delica Lancets 33G Misc 1 each by Does not apply route daily.   oxyCODONE 5 MG immediate release tablet Commonly known as: Roxicodone Take 1 tablet (5 mg total) by mouth every 4 (four) hours as needed for severe pain (pain score 7-10).   oxymetazoline  0.05 % nasal spray Commonly known as: AFRIN Place 1 spray into both nostrils 2 (two) times daily as needed for congestion.   rosuvastatin 40 MG tablet Commonly known as: CRESTOR Take 1 tablet (40 mg total) by mouth daily.   senna 8.6 MG Tabs tablet Commonly known as: SENOKOT Take 1 tablet (8.6 mg total) by mouth 2 (two) times daily as needed.   tadalafil 20 MG tablet Commonly known as: CIALIS Take 1 tablet (20 mg total) by mouth daily. What changed:  when to take this reasons to take this         Follow-up Information     Drake Leach, PA-C Follow up on 05/30/2023.   Specialty: Neurosurgery Contact information: [address] [place] [address] [phone]                 Signed: Susanne Borders 05/13/2023, 12:02 PM

## 2023-05-13 NOTE — Anesthesia Postprocedure Evaluation (Signed)
 Anesthesia Post Note  Patient: Chase Hull  Procedure(s) Performed: L3-4 POSTERIOR SPINAL DECOMPRESSION, LEFT L4-5 MICRODISCECTOMY  Patient location during evaluation: PACU Anesthesia Type: General Level of consciousness: awake and alert Pain management: pain level controlled Vital Signs Assessment: post-procedure vital signs reviewed and stable Respiratory status: spontaneous breathing, nonlabored ventilation, respiratory function stable and patient connected to nasal cannula oxygen Cardiovascular status: blood pressure returned to baseline and stable Postop Assessment: no apparent nausea or vomiting Anesthetic complications: no   No notable events documented.   Last Vitals:  Vitals:   05/13/23 1100 05/13/23 1113  BP: (!) 126/93 124/86  Pulse: 79 76  Resp: (!) 21 20  Temp: (!) 36.1 C   SpO2: 95% 100%    Last Pain:  Vitals:   05/13/23 1113  TempSrc: Temporal  PainSc: 4                  Cleda Mccreedy Mellina Benison

## 2023-05-13 NOTE — Progress Notes (Signed)
 Patient able to ambulate without difficulty around nurse unit, intake of po food and drink, also urinated without difficulty. Meets all discharge requirements safely. Mother present for discharge instructions. All questions answered to patient satisfaction.

## 2023-05-13 NOTE — Op Note (Signed)
 Indications: Mr. Raju Coppolino is suffering from M54.16 lumbar radiculopathy, M48.062 Neurogenic claudication due to lumbar spinal stenosis . The patient tried and failed conservative management, prompting surgical intervention.  Please note that he had a microdiscectomy at L4/5 in 2022.   Findings: scarring of epidural space  Preoperative Diagnosis: M54.16 lumbar radiculopathy, M48.062 Neurogenic claudication due to lumbar spinal stenosis  Postoperative Diagnosis: same   EBL: 25 ml IVF: see anesthesia record Drains: none Disposition: Extubated and Stable to PACU Complications: none  No foley catheter was placed.   Preoperative Note:   Risks of surgery discussed include: infection, bleeding, stroke, coma, death, paralysis, CSF leak, nerve/spinal cord injury, numbness, tingling, weakness, complex regional pain syndrome, recurrent stenosis and/or disc herniation, vascular injury, development of instability, neck/back pain, need for further surgery, persistent symptoms, development of deformity, and the risks of anesthesia. The patient understood these risks and agreed to proceed.  Operative Note:   1) left L4/5 microdiscectomy (redo) 2) L3/4 decompression  The patient was then brought from the preoperative center with intravenous access established.  The patient underwent general anesthesia and endotracheal tube intubation, and was then rotated on the [place] rail top where all pressure points were appropriately padded.  The skin was then thoroughly cleansed.  Perioperative antibiotic prophylaxis was administered.  Sterile prep and drapes were then applied and a timeout was then observed. The prior incision was marked.  Once this was complete the incision was opened with the use of a #10 blade knife.  The Metrx tubes were sequentially advanced under lateral fluoroscopy until a 18 x 60 mm Metrx tube was placed over the left L4/5 facet and lamina and secured to the bed.    The microscope  was then sterilely brought into the field and muscle creep was hemostased with a bipolar and resected with a pituitary rongeur.  A Bovie extender was then used to expose the spinous process and lamina.  Careful attention was placed to not violate the facet capsule. A 3 mm matchstick drill bit was then used to make a hemi-laminotomy trough until the ligamentum flavum was exposed.  This was extended to the base of the spinous process.  Once this was complete and the underlying ligamentum flavum was visualized, the ligamentum was dissected with an up angle curette and resected with a #2 and #3 mm biting Kerrison.  The laminotomy opening was also expanded in similar fashion and hemostasis was obtained with Surgifoam and a patty as well as bone wax.  The rostral aspect of the caudal level of the lamina was also resected with a #2 biting Kerrison effort to further enhance exposure.  Once the underlying dura was visualized a [place] 4 was then used to dissect and expose the traversing nerve root.  During this exposure, substantial scarring was noted. This substantially impacted the amount of disc exposure were were able to achieve.  The disc was noted and a portion dissected free from the dura. Once this was identified a nerve root retractor suction was used to mobilize this medially.  Due to scarring, a small durotomy along the ventrolateral portion of the dura was noted.  This had scant CSF leakage, and was closed with duragen and Vistaseal after the discectomy. The venous plexus was hemostased with Surgifoam and light bipolar use.  A small penfied was then used to make a small annulotomy within the disc space and disc space contents were noted to come through the annulus.    The disc herniation was identified and  dissected free using a balltip probe. The pituitary rongeur was used to remove the extruded disc fragments. Once the thecal sac and nerve root were noted to be relaxed and under less tension the ball-tipped  feeler was passed along the foramen distally to ensure no residual compression was noted.    The area was irrigated. The tube system was then removed under microscopic visualization and hemostasis was obtained with a bipolar.    After performing the decompression at L4-5, the metrx tubes were sequentially advanced and confirmed in position at L3-4. An 18mm by 60mm tube was locked in place to the bed side attachment.  Fluoroscopy was then removed from the field.  The microscope was then sterilely brought into the field and muscle creep was hemostased with a bipolar and resected with a pituitary rongeur.  A Bovie extender was then used to expose the spinous process and lamina.  Careful attention was placed to not violate the facet capsule. A 3 mm matchstick drill bit was then used to make a hemi-laminotomy trough until the ligamentum flavum was exposed.  This was extended to the base of the spinous process and to the contralateral side to remove all the central bone from each side.  Once this was complete and the underlying ligamentum flavum was visualized, it was dissected with a curette and resected with Kerrison rongeurs.  Extensive ligamentum hypertrophy was noted, requiring a substantial amount of time and care for removal.  The dura was identified and palpated. The kerrison rongeur was then used to remove the medial facet bilaterally until no compression was noted.  A balltip probe was used to confirm decompression of the ipsilateral L4 nerve root.  No CSF leak was noted.  Depo-Medrol was placed on the nerve root.   The wound was copiously irrigated. The tube system was then removed under microscopic visualization and hemostasis was obtained with a bipolar.     The fascial layer was reapproximated with the use of a 0- Vicryl suture.  Subcutaneous tissue layer was reapproximated using 2-0 Vicryl suture.  3-0 nylon was used on the skin. The skin was then cleansed and dressed.  Patient was then rotated  back to the preoperative bed awakened from anesthesia and taken to recovery all counts are correct in this case.   I performed the entire procedure with the assistance of Manning Charity PA as an Designer, television/film set. An assistant was required for this procedure due to the complexity.  The assistant provided assistance in tissue manipulation and suction, and was required for the successful and safe performance of the procedure. I performed the critical portions of the procedure.   Venetia Night MD

## 2023-05-14 ENCOUNTER — Encounter: Payer: Self-pay | Admitting: Neurosurgery

## 2023-05-16 ENCOUNTER — Telehealth: Payer: Self-pay | Admitting: Family Medicine

## 2023-05-16 DIAGNOSIS — E1159 Type 2 diabetes mellitus with other circulatory complications: Secondary | ICD-10-CM

## 2023-05-16 MED ORDER — CARVEDILOL 3.125 MG PO TABS: 3.1250 mg | ORAL_TABLET | Freq: Two times a day (BID) | ORAL | 0 refills | Status: DC

## 2023-05-16 NOTE — Telephone Encounter (Signed)
 [facility] is requesting refill carvedilol (COREG) 3.125 MG tablet   Please advise

## 2023-05-17 ENCOUNTER — Telehealth: Payer: Self-pay | Admitting: Pharmacist

## 2023-05-17 NOTE — Progress Notes (Signed)
   05/17/2023  Patient ID: Chase Hull, male   DOB: [birth year], 58 y.o.   MRN: [id number]  Task set to review profile today to see if surgery was completed. Patient was able to receive it and appears to be doing well. A1c came back at 7.0%.  Will set task for 1 month prior to A1c re-check to ensure patient understands importance of continuing to maintain blood sugar control.    Ricka Burdock, PharmD [facility]  Phone Number: [phone]

## 2023-05-20 ENCOUNTER — Encounter: Payer: Self-pay | Admitting: Neurosurgery

## 2023-05-21 ENCOUNTER — Encounter: Payer: BC Managed Care – PPO | Admitting: Orthopedic Surgery

## 2023-05-23 ENCOUNTER — Telehealth: Payer: Self-pay | Admitting: Family Medicine

## 2023-05-23 MED ORDER — ALLOPURINOL 100 MG PO TABS: 200.0000 mg | ORAL_TABLET | Freq: Every day | ORAL | 0 refills | Status: DC

## 2023-05-23 NOTE — Telephone Encounter (Signed)
 [facility] is requesting refill allopurinol (ZYLOPRIM) 100 MG tablet   Please advise

## 2023-05-27 NOTE — Progress Notes (Unsigned)
   REFERRING PHYSICIAN:  [name], Fnp No address on file  DOS: 05/13/23 left L4-L5 redo microdisectomy and L3-L4 decompression  HISTORY OF PRESENT ILLNESS: Chase Hull is approximately 2 weeks status post above surgery. Was given oxycodone and robaxin on discharge from the hospital.   Had some durotomy and CSF leak during surgery. ***      PHYSICAL EXAMINATION:  General: Patient is well developed, well nourished, calm, collected, and in no apparent distress.   NEUROLOGICAL:  General: In no acute distress.   Awake, alert, oriented to person, place, and time.  Pupils equal round and reactive to light.  Facial tone is symmetric.     Strength:            Side Iliopsoas Quads Hamstring PF DF EHL  R 5 5 5 5 5 5   L 5 5 5 5 5 5    Incision c/d/i   ROS (Neurologic):  Negative except as noted above  IMAGING: Nothing new to review.   ASSESSMENT/PLAN:  Chase Hull is doing well s/p above surgery. Treatment options reviewed with patient and following plan made:   - I have advised the patient to lift up to 10 pounds until 6 weeks after surgery (follow up with Dr. Myer Haff).  - Reviewed wound care.  - No bending, twisting, or lifting.  - Continue on current medications including ***.  - Follow up as scheduled in 4 weeks and prn.   Advised to contact the office if any questions or concerns arise.  Drake Leach PA-C Department of neurosurgery

## 2023-05-30 ENCOUNTER — Ambulatory Visit (INDEPENDENT_AMBULATORY_CARE_PROVIDER_SITE_OTHER): Payer: BC Managed Care – PPO | Admitting: Orthopedic Surgery

## 2023-05-30 ENCOUNTER — Encounter: Payer: Self-pay | Admitting: Orthopedic Surgery

## 2023-05-30 VITALS — BP 130/88 | Temp 98.5°F | Ht 72.0 in | Wt 233.0 lb

## 2023-05-30 DIAGNOSIS — M48062 Spinal stenosis, lumbar region with neurogenic claudication: Secondary | ICD-10-CM

## 2023-05-30 DIAGNOSIS — Z9889 Other specified postprocedural states: Secondary | ICD-10-CM

## 2023-06-05 ENCOUNTER — Other Ambulatory Visit: Payer: Self-pay | Admitting: Orthopedic Surgery

## 2023-06-05 ENCOUNTER — Other Ambulatory Visit: Payer: Self-pay | Admitting: Family Medicine

## 2023-06-05 ENCOUNTER — Telehealth: Payer: Self-pay | Admitting: Family Medicine

## 2023-06-05 DIAGNOSIS — M47816 Spondylosis without myelopathy or radiculopathy, lumbar region: Secondary | ICD-10-CM

## 2023-06-05 DIAGNOSIS — I152 Hypertension secondary to endocrine disorders: Secondary | ICD-10-CM

## 2023-06-05 DIAGNOSIS — M5416 Radiculopathy, lumbar region: Secondary | ICD-10-CM

## 2023-06-05 NOTE — Telephone Encounter (Signed)
 Rf converted to refill medication

## 2023-06-05 NOTE — Telephone Encounter (Signed)
[facility] faxed refill request for the following medications:  glipiZIDE (GLUCOTROL) 10 MG tablet   Please advise.  

## 2023-06-06 MED ORDER — GLIPIZIDE 10 MG PO TABS: 10.0000 mg | ORAL_TABLET | Freq: Two times a day (BID) | ORAL | 0 refills | Status: DC

## 2023-06-06 NOTE — Telephone Encounter (Signed)
 Too early for refills. Requested Prescriptions  Signed Prescriptions Disp Refills   glipiZIDE (GLUCOTROL) 10 MG tablet 180 tablet 0    Sig: Take 1 tablet (10 mg total) by mouth 2 (two) times daily before a meal. Frequency change from once daily to twice daily     Endocrinology:  Diabetes - Sulfonylureas Passed - 06/06/2023 12:13 PM      Passed - HBA1C is between 0 and 7.9 and within 180 days    Hemoglobin A1C  Date Value Ref Range Status  07/25/2017 5.8  Final   HB A1C (BAYER DCA - WAIVED)  Date Value Ref Range Status  05/01/2022 11.4 (H) 4.8 - 5.6 % Final    Comment:             Prediabetes: 5.7 - 6.4          Diabetes: >6.4          Glycemic control for adults with diabetes: <7.0    HbA1c, POC (prediabetic range)  Date Value Ref Range Status  05/01/2023 7.0 (A) 5.7 - 6.4 % Final         Passed - Cr in normal range and within 360 days    Creatinine, Ser  Date Value Ref Range Status  05/01/2023 0.86 0.61 - 1.24 mg/dL Final         Passed - Valid encounter within last 6 months    Recent Outpatient Visits           1 month ago Type 2 diabetes mellitus with hyperglycemia, without long-term current use of insulin (HCC)   [facility] Pardue, Monico Blitz, DO       Future Appointments             In 1 month Pardue, Monico Blitz, DO [facility] [facility]            Refused Prescriptions Disp Refills   allopurinol (ZYLOPRIM) 100 MG tablet [Pharmacy Med Name: ALLOPURINOL 100 MG TABLET] 180 tablet 0    Sig: TAKE 2 TABLETS BY MOUTH EVERY DAY     Endocrinology:  Gout Agents - allopurinol Failed - 06/06/2023 12:13 PM      Failed - Uric Acid in normal range and within 360 days    Uric Acid  Date Value Ref Range Status  03/21/2022 5.5 3.8 - 8.4 mg/dL Final    Comment:               Therapeutic target for gout patients: <6.0         Passed - Cr in normal range and within 360 days    Creatinine, Ser  Date Value Ref Range Status   05/01/2023 0.86 0.61 - 1.24 mg/dL Final         Passed - Valid encounter within last 12 months    Recent Outpatient Visits           1 month ago Type 2 diabetes mellitus with hyperglycemia, without long-term current use of insulin [facility])   [facility] Pardue, Monico Blitz, DO       Future Appointments             In 1 month Pardue, Monico Blitz, DO [facility]            Passed - CBC within normal limits and completed in the last 12 months    WBC  Date Value Ref Range Status  05/01/2023 9.6  4.0 - 10.5 K/uL Final   RBC  Date Value Ref Range Status  05/01/2023 4.61 4.22 - 5.81 MIL/uL Final   Hemoglobin  Date Value Ref Range Status  05/01/2023 14.2 13.0 - 17.0 g/dL Final  16/11/9602 54.0 13.0 - 17.7 g/dL Final   HCT  Date Value Ref Range Status  05/01/2023 41.7 39.0 - 52.0 % Final   Hematocrit  Date Value Ref Range Status  01/29/2023 41.2 37.5 - 51.0 % Final   MCHC  Date Value Ref Range Status  05/01/2023 34.1 30.0 - 36.0 g/dL Final   [facility]  Date Value Ref Range Status  05/01/2023 30.8 26.0 - 34.0 pg Final   MCV  Date Value Ref Range Status  05/01/2023 90.5 80.0 - 100.0 fL Final  01/29/2023 92 79 - 97 fL Final   No results found for: "PLTCOUNTKUC", "LABPLAT", "POCPLA" RDW  Date Value Ref Range Status  05/01/2023 13.6 11.5 - 15.5 % Final  01/29/2023 11.5 (L) 11.6 - 15.4 % Final          carvedilol (COREG) 3.125 MG tablet [Pharmacy Med Name: CARVEDILOL 3.125 MG TABLET] 180 tablet 0    Sig: TAKE 1 TABLET BY MOUTH TWICE A DAY WITH A MEAL     Cardiovascular: Beta Blockers 3 Passed - 06/06/2023 12:13 PM      Passed - Cr in normal range and within 360 days    Creatinine, Ser  Date Value Ref Range Status  05/01/2023 0.86 0.61 - 1.24 mg/dL Final         Passed - AST in normal range and within 360 days    AST  Date Value Ref Range Status  01/29/2023 22 0 - 40 IU/L Final         Passed - ALT in normal  range and within 360 days    ALT  Date Value Ref Range Status  01/29/2023 24 0 - 44 IU/L Final         Passed - Last BP in normal range    BP Readings from Last 1 Encounters:  05/30/23 130/88         Passed - Last Heart Rate in normal range    Pulse Readings from Last 1 Encounters:  05/13/23 76         Passed - Valid encounter within last 6 months    Recent Outpatient Visits           1 month ago Type 2 diabetes mellitus with hyperglycemia, without long-term current use of insulin (HCC)   [place] [facility] Pardue, Monico Blitz, DO       Future Appointments             In 1 month Pardue, Monico Blitz, DO [facility], [facility]

## 2023-06-06 NOTE — Telephone Encounter (Signed)
 Requested Prescriptions  Pending Prescriptions Disp Refills   allopurinol (ZYLOPRIM) 100 MG tablet [Pharmacy Med Name: ALLOPURINOL 100 MG TABLET] 180 tablet 0    Sig: TAKE 2 TABLETS BY MOUTH EVERY DAY     Endocrinology:  Gout Agents - allopurinol Failed - 06/06/2023 12:11 PM      Failed - Uric Acid in normal range and within 360 days    Uric Acid  Date Value Ref Range Status  03/21/2022 5.5 3.8 - 8.4 mg/dL Final    Comment:               Therapeutic target for gout patients: <6.0         Passed - Cr in normal range and within 360 days    Creatinine, Ser  Date Value Ref Range Status  05/01/2023 0.86 0.61 - 1.24 mg/dL Final         Passed - Valid encounter within last 12 months    Recent Outpatient Visits           1 month ago Type 2 diabetes mellitus with hyperglycemia, without long-term current use of insulin (HCC)   [place] [facility] Pardue, Monico Blitz, DO       Future Appointments             In 1 month Pardue, Monico Blitz, DO [place] [facility]            Passed - CBC within normal limits and completed in the last 12 months    WBC  Date Value Ref Range Status  05/01/2023 9.6 4.0 - 10.5 K/uL Final   RBC  Date Value Ref Range Status  05/01/2023 4.61 4.22 - 5.81 MIL/uL Final   Hemoglobin  Date Value Ref Range Status  05/01/2023 14.2 13.0 - 17.0 g/dL Final  16/11/9602 54.0 13.0 - 17.7 g/dL Final   HCT  Date Value Ref Range Status  05/01/2023 41.7 39.0 - 52.0 % Final   Hematocrit  Date Value Ref Range Status  01/29/2023 41.2 37.5 - 51.0 % Final   MCHC  Date Value Ref Range Status  05/01/2023 34.1 30.0 - 36.0 g/dL Final   [facility]  Date Value Ref Range Status  05/01/2023 30.8 26.0 - 34.0 pg Final   MCV  Date Value Ref Range Status  05/01/2023 90.5 80.0 - 100.0 fL Final  01/29/2023 92 79 - 97 fL Final   No results found for: "PLTCOUNTKUC", "LABPLAT", "POCPLA" RDW  Date Value Ref Range Status  05/01/2023 13.6  11.5 - 15.5 % Final  01/29/2023 11.5 (L) 11.6 - 15.4 % Final          carvedilol (COREG) 3.125 MG tablet [Pharmacy Med Name: CARVEDILOL 3.125 MG TABLET] 180 tablet 0    Sig: TAKE 1 TABLET BY MOUTH TWICE A DAY WITH A MEAL     Cardiovascular: Beta Blockers 3 Passed - 06/06/2023 12:11 PM      Passed - Cr in normal range and within 360 days    Creatinine, Ser  Date Value Ref Range Status  05/01/2023 0.86 0.61 - 1.24 mg/dL Final         Passed - AST in normal range and within 360 days    AST  Date Value Ref Range Status  01/29/2023 22 0 - 40 IU/L Final         Passed - ALT in normal range and within 360 days    ALT  Date Value Ref Range Status  01/29/2023 24 0 - 44 IU/L Final         Passed - Last BP in normal range    BP Readings from Last 1 Encounters:  05/30/23 130/88         Passed - Last Heart Rate in normal range    Pulse Readings from Last 1 Encounters:  05/13/23 76         Passed - Valid encounter within last 6 months    Recent Outpatient Visits           1 month ago Type 2 diabetes mellitus with hyperglycemia, without long-term current use of insulin (HCC)   [facility] P[facility], Monico Blitz, DO       Future Appointments             In 1 month Pardue, Monico Blitz, DO [facility]             glipiZIDE (GLUCOTROL) 10 MG tablet 180 tablet 0    Sig: Take 1 tablet (10 mg total) by mouth 2 (two) times daily before a meal. Frequency change from once daily to twice daily     Endocrinology:  Diabetes - Sulfonylureas Passed - 06/06/2023 12:11 PM      Passed - HBA1C is between 0 and 7.9 and within 180 days    Hemoglobin A1C  Date Value Ref Range Status  07/25/2017 5.8  Final   HB A1C (BAYER DCA - WAIVED)  Date Value Ref Range Status  05/01/2022 11.4 (H) 4.8 - 5.6 % Final    Comment:             Prediabetes: 5.7 - 6.4          Diabetes: >6.4          Glycemic control for adults with diabetes: <7.0     HbA1c, POC (prediabetic range)  Date Value Ref Range Status  05/01/2023 7.0 (A) 5.7 - 6.4 % Final         Passed - Cr in normal range and within 360 days    Creatinine, Ser  Date Value Ref Range Status  05/01/2023 0.86 0.61 - 1.24 mg/dL Final         Passed - Valid encounter within last 6 months    Recent Outpatient Visits           1 month ago Type 2 diabetes mellitus with hyperglycemia, without long-term current use of insulin [facility])   [place] [facility] Pardue, Monico Blitz, DO       Future Appointments             In 1 month Pardue, Monico Blitz, DO  [facility], [facility]

## 2023-06-11 ENCOUNTER — Ambulatory Visit: Admitting: Family Medicine

## 2023-06-11 ENCOUNTER — Encounter: Payer: Self-pay | Admitting: Family Medicine

## 2023-06-11 VITALS — BP 126/95 | HR 87 | Resp 16 | Ht 72.0 in | Wt 230.0 lb

## 2023-06-11 DIAGNOSIS — F17200 Nicotine dependence, unspecified, uncomplicated: Secondary | ICD-10-CM | POA: Diagnosis not present

## 2023-06-11 DIAGNOSIS — Z23 Encounter for immunization: Secondary | ICD-10-CM

## 2023-06-11 DIAGNOSIS — E1159 Type 2 diabetes mellitus with other circulatory complications: Secondary | ICD-10-CM

## 2023-06-11 DIAGNOSIS — G5601 Carpal tunnel syndrome, right upper limb: Secondary | ICD-10-CM

## 2023-06-11 DIAGNOSIS — Z0000 Encounter for general adult medical examination without abnormal findings: Secondary | ICD-10-CM

## 2023-06-11 DIAGNOSIS — Z0001 Encounter for general adult medical examination with abnormal findings: Secondary | ICD-10-CM

## 2023-06-11 DIAGNOSIS — I152 Hypertension secondary to endocrine disorders: Secondary | ICD-10-CM

## 2023-06-11 DIAGNOSIS — Z716 Tobacco abuse counseling: Secondary | ICD-10-CM
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
## 2023-06-14 ENCOUNTER — Encounter: Payer: Self-pay | Admitting: Neurosurgery

## 2023-06-20 DIAGNOSIS — G5601 Carpal tunnel syndrome, right upper limb: Secondary | ICD-10-CM | POA: Diagnosis not present

## 2023-06-27 ENCOUNTER — Ambulatory Visit: Payer: BC Managed Care – PPO | Admitting: Neurosurgery

## 2023-06-27 ENCOUNTER — Encounter: Payer: Self-pay | Admitting: Neurosurgery

## 2023-06-27 VITALS — BP 142/90 | Temp 98.3°F | Ht 72.0 in | Wt 230.0 lb

## 2023-06-27 DIAGNOSIS — M48062 Spinal stenosis, lumbar region with neurogenic claudication: Secondary | ICD-10-CM

## 2023-06-27 DIAGNOSIS — Z09 Encounter for follow-up examination after completed treatment for conditions other than malignant neoplasm: Secondary | ICD-10-CM

## 2023-06-27 NOTE — Progress Notes (Signed)
   REFERRING PHYSICIAN:  Normie Becton, Fnp No address on file  DOS: 05/13/23 left L4-L5 redo microdisectomy and L3-L4 decompression  HISTORY OF PRESENT ILLNESS: Chase Hull is status post above surgery.  He is doing well.     His right leg numbness has improved greatly.  He is walking farther.  His left leg is still numb.    PHYSICAL EXAMINATION:  General: Patient is well developed, well nourished, calm, collected, and in no apparent distress.   NEUROLOGICAL:  General: In no acute distress.   Awake, alert, oriented to person, place, and time.  Pupils equal round and reactive to light.  Facial tone is symmetric.     Strength:           Side Iliopsoas Quads Hamstring PF DF EHL  R 5 5 5 5 5 5   L 5 5 5 5 5 5    Incision c/d/I   ROS (Neurologic):  Negative except as noted above  IMAGING: Nothing new to review.   ASSESSMENT/PLAN:  Chase Hull is doing well s/p above surgery.   I suspect he will continue to improve for months.  We reviewed his activity limitations.  He is not ready to return to work.  We will see him back in 6 weeks.   Jodeen Munch MD Department of neurosurgery

## 2023-07-02 ENCOUNTER — Telehealth: Payer: Self-pay | Admitting: Pharmacist

## 2023-07-02 NOTE — Progress Notes (Signed)
 07/02/2023 Name: Chase Hull MRN: [id number] DOB: 1965/08/25  Chief Complaint  Patient presents with   Diabetes    Chase Hull is a 58 y.o. year old male who presented for a telephone visit.   They were referred to the pharmacist by their PCP for assistance in managing diabetes and hypertension.    Subjective:  Care Team: Primary Care Provider: Carlean Charter, DO ; Next Scheduled Visit: 08/01/23 Clinical Pharmacist: Chase Hull, PharmD  Medication Access/Adherence  Current Pharmacy:  [facility] [address] [place] - [address] [place] [address] Phone: [phone] Fax: [phone]  [id number] [place], [place] - [address] [place] [address] Phone: [phone] Fax: [phone]  [facility] [address] [place] [address] Phone: [phone] Fax: [phone]   Patient reports affordability concerns with their medications: No  Patient reports access/transportation concerns to their pharmacy: No  Patient reports adherence concerns with their medications:  No     Diabetes:  Current medications: Glipizide  10mg  twice a day, Metformin  500mg  (2000mg  max dose daily) Medications tried in the past: None  Current glucose readings: Range from 78 to 125 fasting Using Ultra 2 meter; testing daily prior to breakfast   Patient denies hypoglycemic s/sx including dizziness, shakiness, sweating. Patient denies hyperglycemic symptoms including polyuria, polydipsia, polyphagia, nocturia, neuropathy, blurred vision.  Current meal patterns:  - Breakfast (10:30-11AM): Was frying few eggs + bread + bacon; now 2 plain white toast + boiled egg; plain oatmeal 2 packs - Lunch: Skips - Supper (8PM): Grilled chicken sandwich, salad, chicken or tuna salad with crackers (eats at work), canned peas, corn, broccoli (veggies 4 out of 5 days a week) - Snacks: Tries to avoid now; 1 banana -  Drinks: Water , diet drinks, black coffee in AM (max of 2 cups= 12 oz) *Removed fried foods (baked only); has changed diet drastically lately since A1c went up  Current physical activity: A lot of walking  Works at [employer] - 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  Patient denies hypertensive symptoms including headache, chest pain, shortness of breath  Current medication access support: [employer] commercial   Objective:  Lab Results  Component Value Date   HGBA1C 7.0 (A) 05/01/2023    Lab Results  Component Value Date   CREATININE 0.86 05/01/2023   BUN 16 05/01/2023   NA 137 05/01/2023   K 3.4 (L) 05/01/2023   CL 105 05/01/2023   CO2 23 05/01/2023    Lab Results  Component Value Date   CHOL 193 01/29/2023   HDL 43 01/29/2023   LDLCALC 101 (H) 01/29/2023   TRIG 292 (H) 01/29/2023   CHOLHDL 4.5 01/29/2023    Medications Reviewed Today     Reviewed by Chase Hull, RPH (Pharmacist) on 07/02/23 at 1026  Med List Status: <None>   Medication Order Taking? Sig Documenting Provider Last Dose Status Informant  allopurinol  (ZYLOPRIM ) 100 MG tablet [id number] Yes Take 2 tablets (200 mg total) by mouth daily. Chase Charter, DO Taking Active   amLODipine  (NORVASC ) 10 MG tablet [id number] Yes  Take 1 tablet (10 mg total) by mouth daily. Chase Becton, FNP Taking Active Self  aspirin 81 MG tablet [id number] No Take 81 mg by mouth daily.  Patient not taking: Reported on 07/02/2023   [provider] Not Taking Active Self           Med Note Chase Hull   Tue Jul 02, 2023 10:26 AM) No  Cardiac reason to need to restart  Azelastine  HCl 137 MCG/SPRAY SOLN [id number] Yes Place 1-2 sprays into the nose daily. Chase Charter, DO Taking Active   Blood Glucose Monitoring Suppl (ACCU-CHEK GUIDE ME) w/Device KIT [id number] Yes 1 kit by Does not apply route daily. [provider] Taking Active   carvedilol  (COREG ) 3.125 MG tablet [id number] Yes Take 1 tablet (3.125 mg total) by mouth 2 (two) times daily with a meal. Chase Hull, Chase Blacksmith, DO Taking Active   fenofibrate  (TRICOR ) 145 MG tablet [id number] Yes Take 1 tablet (145 mg total) by mouth daily. Chase Becton, FNP Taking Active Self  glipiZIDE  (GLUCOTROL ) 10 MG tablet [id number] Yes Take 1 tablet (10 mg total) by mouth 2 (two) times daily before a meal. Frequency change from once daily to twice daily Chase Hull, Chase N, DO Taking Active            Med Note Chase Hull   Tue Jul 02, 2023 10:18 AM) Taking 1 tablet daily  glucose blood (ACCU-CHEK GUIDE TEST) test strip [id number] Yes Use to check blood glucose daily Dx E11.65 Chase Becton, FNP Taking Active   levocetirizine (XYZAL ) 5 MG tablet [id number] Yes Take 1 tablet (5 mg total) by mouth every evening. Chase Charter, DO Taking Active   lisinopril  (ZESTRIL ) 40 MG tablet [id number] Yes Take 1 tablet (40 mg total) by mouth daily. Chase Becton, FNP Taking Active Self  metFORMIN  (GLUCOPHAGE -XR) 500 MG 24 hr tablet [id number] Yes Take 2 tablets (1,000 mg total) by mouth 2 (two) times daily with a meal. Chase Becton, FNP Taking Active Self           Med Note Chase Hull   Tue Jul 02, 2023 10:19 AM) On last bottle leftover (per 5/6)  methocarbamol  (ROBAXIN ) 500 MG tablet [id number] Yes TAKE 1 TABLET BY MOUTH EVERY 8 (EIGHT) HOURS AS NEEDED FOR MUSCLE SPASMS. THIS CAN MAKE YOU SLEEPY. Chase Safer, Chase Hull Taking Active   omeprazole  (PRILOSEC) 40 MG capsule [id number] Yes Take 1 capsule (40 mg total) by mouth daily. Chase Charter, DO Taking Active   OneTouch Delica Lancets 33G [place]  [id number] Yes 1 each by Does not apply route daily. Chase Ferri P, DO Taking Active Self  oxymetazoline  (AFRIN) 0.05 % nasal spray [id number] Yes Place 1 spray into both nostrils 2 (two) times daily as needed for congestion. [provider] Taking Active Self  rosuvastatin  (CRESTOR ) 40 MG tablet [id number] Yes Take 1 tablet (40 mg total) by mouth daily. Chase Becton, FNP Taking Active Self  tadalafil  (CIALIS ) 20 MG tablet [id number] Yes Take 1 tablet (20 mg total) by mouth daily.  Patient taking differently: Take 20 mg by mouth daily as needed for erectile dysfunction.   Chase Becton, FNP Taking Active Self              Assessment/Plan:   Diabetes: - Currently controlled - Reviewed long term cardiovascular and renal outcomes of uncontrolled blood sugar - Reviewed goal A1c, goal fasting, and goal 2 hour post prandial glucose - Reviewed  dietary modifications including switching toast in the morning to whole wheat instead and minimize oatmeal consumption - Recommend to check glucose daily before breakfast and write it down - Continue working on diet and taking medications as prescribed - Finishing up metformin  supply- advised to request refill from pharmacy when needed again    Hypertension: - Currently controlled - Reviewed long term cardiovascular and renal outcomes of uncontrolled blood pressure - Reviewed appropriate blood pressure monitoring technique and reviewed goal blood pressure. Recommended to check home blood pressure and heart rate daily - Says BP has looked good at home, but pain in hand is making it go up a little    Follow Up Plan:  - No follow-up with the patient as he has been doing well - Reports carpal tunnel injections only helped for a few days- will need to call [facility] to schedule a follow-up appointment for concerns   Chase Hull, PharmD [facility] Phone Number: [phone]

## 2023-07-28 NOTE — Progress Notes (Signed)
   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   He has some stiffness in his lower back with numbness in left leg. He feels like he is continuing to improve.   He continues on prn robaxin  and prn tylenol .   PHYSICAL EXAMINATION:  General: Patient is well developed, well nourished, calm, collected, and in no apparent distress.   NEUROLOGICAL:  General: In no acute distress.   Awake, alert, oriented to person, place, and time.  Pupils equal round and reactive to light.  Facial tone is symmetric.     Strength:          Side Iliopsoas Quads Hamstring PF DF EHL  R 5 5 5 5 5 5   L 5 5 5 5 5 5    Incision well healed.   Sensation intact to light touch in bilateral lower extremities, but it is diminished in left thigh.    ROS (Neurologic):  Negative except as noted above  IMAGING: Nothing new to review.   ASSESSMENT/PLAN:  YANG RACK is doing well s/p above surgery. Treatment options reviewed with patient and following plan made:   - Return to activity slowly as tolerated.  - Discussed return to work. He will self limit and avoid lifting over 50 lbs. Note given to return tomorrow.  - Follow up prn at his request.   Advised to contact the office if any questions or concerns arise.  Lucetta Russel PA-C Department of neurosurgery

## 2023-08-01 ENCOUNTER — Ambulatory Visit (INDEPENDENT_AMBULATORY_CARE_PROVIDER_SITE_OTHER): Admitting: Family Medicine

## 2023-08-01 ENCOUNTER — Encounter: Payer: Self-pay | Admitting: Family Medicine

## 2023-08-01 VITALS — BP 129/88 | HR 85 | Resp 18 | Ht 72.0 in | Wt 240.3 lb

## 2023-08-01 DIAGNOSIS — E1159 Type 2 diabetes mellitus with other circulatory complications: Secondary | ICD-10-CM | POA: Diagnosis not present

## 2023-08-01 DIAGNOSIS — M1A0690 Idiopathic chronic gout, unspecified knee, without tophus (tophi): Secondary | ICD-10-CM | POA: Diagnosis not present

## 2023-08-01 DIAGNOSIS — Z79899 Other long term (current) drug therapy: Secondary | ICD-10-CM | POA: Diagnosis not present

## 2023-08-01 DIAGNOSIS — Z9889 Other specified postprocedural states: Secondary | ICD-10-CM

## 2023-08-01 DIAGNOSIS — K219 Gastro-esophageal reflux disease without esophagitis: Secondary | ICD-10-CM

## 2023-08-01 DIAGNOSIS — E1169 Type 2 diabetes mellitus with other specified complication: Secondary | ICD-10-CM | POA: Diagnosis not present

## 2023-08-01 DIAGNOSIS — E1165 Type 2 diabetes mellitus with hyperglycemia: Secondary | ICD-10-CM | POA: Diagnosis not present

## 2023-08-01 DIAGNOSIS — E785 Hyperlipidemia, unspecified: Secondary | ICD-10-CM

## 2023-08-01 DIAGNOSIS — I152 Hypertension secondary to endocrine disorders: Secondary | ICD-10-CM

## 2023-08-01 NOTE — Progress Notes (Signed)
 Established patient visit   Patient: Chase Hull   DOB: 07/10/1965   58 y.o. Male  MRN: [id number] Visit Date: 08/01/2023  Today's healthcare provider: Carlean Charter, DO   Chief Complaint  Patient presents with   Diabetes    Will call to set an appt up for eye exam.   Hypertension   Subjective    HPI Last annual exam 06/11/2023  Chase Hull is a 58 year old male with hypertension who presents for blood pressure management and follow-up after back surgery.  He has noted mildly elevated blood pressure readings at home, with diastolic values around 89 to 90 mmHg. He attributes this elevation to decreased physical activity and weight gain since being out of work. He is not currently taking aspirin post-back surgery and uses Tylenol  for pain management. No history of cardiovascular disease or stroke is reported.  He is recovering from back surgery and is cautious with physical activities. He is not currently undergoing physical therapy but maintains a routine of walking for 30 to 40 minutes, as he was recommended to do. He plans to return to work on June 11th.  He has a history of gout and moderates his intake of red meat and pork to prevent flare-ups. His diet primarily includes chicken and fish, and he avoids junk food, potatoes, and bread to manage his blood sugar and weight. He eats twice a day and includes vegetables like broccoli in his meals. He is motivated to lose weight, aiming to return to his pre-surgery weight in the 220s.  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 For allergies, he takes levocetirizine at night and uses a nasal spray once a day, which has significantly improved his breathing.      Medications: Outpatient Medications Prior to Visit  Medication Sig   allopurinol  (ZYLOPRIM ) 100 MG tablet Take 2 tablets (200 mg total) by mouth daily.   amLODipine  (NORVASC ) 10 MG tablet Take 1 tablet (10 mg total)  by mouth daily.   Azelastine  HCl 137 MCG/SPRAY SOLN Place 1-2 sprays into the nose daily.   Blood Glucose Monitoring Suppl (ACCU-CHEK GUIDE ME) w/Device KIT 1 kit by Does not apply route daily.   carvedilol  (COREG ) 3.125 MG tablet Take 1 tablet (3.125 mg total) by mouth 2 (two) times daily with a meal.   fenofibrate  (TRICOR ) 145 MG tablet Take 1 tablet (145 mg total) by mouth daily.   glipiZIDE  (GLUCOTROL ) 10 MG tablet Take 1 tablet (10 mg total) by mouth 2 (two) times daily before a meal. Frequency change from once daily to twice daily   glucose blood (ACCU-CHEK GUIDE TEST) test strip Use to check blood glucose daily Dx E11.65   levocetirizine (XYZAL ) 5 MG tablet Take 1 tablet (5 mg total) by mouth every evening.   lisinopril  (ZESTRIL ) 40 MG tablet Take 1 tablet (40 mg total) by mouth daily.   metFORMIN  (GLUCOPHAGE -XR) 500 MG 24 hr tablet Take 2 tablets (1,000 mg total) by mouth 2 (two) times daily with a meal.   methocarbamol  (ROBAXIN ) 500 MG tablet TAKE 1 TABLET BY MOUTH EVERY 8 (EIGHT) HOURS AS NEEDED FOR MUSCLE SPASMS. THIS CAN MAKE YOU SLEEPY.   omeprazole  (PRILOSEC) 40 MG capsule Take 1 capsule (40 mg total) by mouth daily.   OneTouch Delica Lancets 33G MISC 1 each by Does not apply route daily.   rosuvastatin  (CRESTOR ) 40 MG tablet Take 1 tablet (40 mg total) by  mouth daily.   tadalafil  (CIALIS ) 20 MG tablet Take 1 tablet (20 mg total) by mouth daily. (Patient taking differently: Take 20 mg by mouth daily as needed for erectile dysfunction.)   [DISCONTINUED] oxymetazoline  (AFRIN) 0.05 % nasal spray Place 1 spray into both nostrils 2 (two) times daily as needed for congestion.   [DISCONTINUED] aspirin 81 MG tablet Take 81 mg by mouth daily. (Patient not taking: Reported on 08/01/2023)   No facility-administered medications prior to visit.        Objective    BP 129/88 (BP Location: Right Arm, Patient Position: Sitting, Cuff Size: Large)   Pulse 85   Resp 18   Ht 6' (1.829 m)   Wt  240 lb 4.8 oz (109 kg)   SpO2 96%   BMI 32.59 kg/m     Physical Exam Vitals and nursing note reviewed.  Constitutional:      General: He is not in acute distress.    Appearance: Normal appearance.  HENT:     Head: Normocephalic and atraumatic.   Eyes:     General: No scleral icterus.    Conjunctiva/sclera: Conjunctivae normal.    Cardiovascular:     Rate and Rhythm: Normal rate.  Pulmonary:     Effort: Pulmonary effort is normal.   Neurological:     Mental Status: He is alert and oriented to person, place, and time. Mental status is at baseline.   Psychiatric:        Mood and Affect: Mood normal.        Behavior: Behavior normal.       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 Agreed on weight loss and increased activity. No medication adjustment to allow lifestyle changes. Encouraged home BP monitoring. - Order blood work to assess health status. - Encourage weight loss through diet and increased physical activity. - Reassess BP management in follow-up. - Encourage home BP monitoring.  Type 2 diabetes We will check hemoglobin A1c today.  No acute concerns.   - Continue metformin  XR 1000 mg twice daily. - Schedule eye exam.  Postoperative Recovery from Back Surgery Recovering from March back surgery. Plans to return to work on June 11th. Follow-up with  neurosurgeon on June 10th. - Encourage walking as exercise. - Follow up with neurosurgeon on June 10th. - Defer to specialist management  Gout Managed through dietary modifications, limiting red meat to prevent flare-ups. - Continue dietary management to prevent gout flare-ups.  General Health Maintenance Managing weight through diet and activity, focusing on limiting high-calorie foods and increasing fruits and vegetables. Declined dietitian consultation. Reminded to schedule eye exam. - Encourage increased intake of fruits and vegetables. - Limit high-calorie foods such as bread and starches. - Offer dietitian consultation if needed. - Schedule eye exam.    Follow-up Follow-up in three months to assess weight and  BP management. Motivated to lose weight and increase activity upon returning to work. - Schedule follow-up appointment in three months to assess weight and BP management.  Return in about 3 months (around 11/01/2023) for HTN, Weight.      I discussed the assessment and treatment plan with the patient  The patient was provided an opportunity to ask questions and all were answered. The patient agreed with the plan and demonstrated an understanding of the instructions.   The patient was advised to call back or seek an in-person evaluation if the symptoms worsen or if the condition fails to improve as anticipated.    Carlean Charter, DO  [facility] [phone] (phone) [phone] (fax)  [facility]

## 2023-08-02 LAB — LIPID PANEL

## 2023-08-05 ENCOUNTER — Ambulatory Visit: Payer: Self-pay | Admitting: Family Medicine

## 2023-08-06 ENCOUNTER — Encounter: Payer: Self-pay | Admitting: Orthopedic Surgery

## 2023-08-06 ENCOUNTER — Ambulatory Visit (INDEPENDENT_AMBULATORY_CARE_PROVIDER_SITE_OTHER): Payer: BC Managed Care – PPO | Admitting: Orthopedic Surgery

## 2023-08-06 VITALS — BP 130/84 | Temp 98.4°F | Ht 72.0 in | Wt 240.0 lb

## 2023-08-06 DIAGNOSIS — M48062 Spinal stenosis, lumbar region with neurogenic claudication: Secondary | ICD-10-CM

## 2023-08-06 DIAGNOSIS — Z9889 Other specified postprocedural states: Secondary | ICD-10-CM

## 2023-08-06 LAB — COMPREHENSIVE METABOLIC PANEL WITH GFR
ALT: 30 IU/L (ref 0–44)
AST: 29 IU/L (ref 0–40)
Albumin: 4.7 g/dL (ref 3.8–4.9)
Alkaline Phosphatase: 97 IU/L (ref 44–121)
BUN/Creatinine Ratio: 14 (ref 9–20)
BUN: 13 mg/dL (ref 6–24)
Bilirubin Total: <0.2 mg/dL (ref 0.0–1.2)
CO2: 19 mmol/L — ABNORMAL LOW (ref 20–29)
Calcium: 10.2 mg/dL (ref 8.7–10.2)
Chloride: 104 mmol/L (ref 96–106)
Creatinine, Ser: 0.92 mg/dL (ref 0.76–1.27)
Globulin, Total: 2.3 g/dL (ref 1.5–4.5)
Glucose: 59 mg/dL — ABNORMAL LOW (ref 70–99)
Potassium: 3.7 mmol/L (ref 3.5–5.2)
Sodium: 143 mmol/L (ref 134–144)
Total Protein: 7 g/dL (ref 6.0–8.5)
eGFR: 98 mL/min/{1.73_m2} (ref >59)

## 2023-08-06 LAB — LIPID PANEL
Chol/HDL Ratio: 3.5 ratio (ref 0.0–5.0)
Cholesterol, Total: 139 mg/dL (ref 100–199)
HDL: 40 mg/dL (ref >39)
LDL Chol Calc (NIH): 75 mg/dL (ref 0–99)
Triglycerides: 134 mg/dL (ref 0–149)
VLDL Cholesterol Cal: 24 mg/dL (ref 5–40)

## 2023-08-06 LAB — HEMOGLOBIN A1C
Est. average glucose Bld gHb Est-mCnc: 137 mg/dL
Hgb A1c MFr Bld: 6.4 % — ABNORMAL HIGH (ref 4.8–5.6)

## 2023-08-06 LAB — VITAMIN B12: Vitamin B-12: 494 pg/mL (ref 232–1245)

## 2023-10-22 ENCOUNTER — Telehealth: Payer: Self-pay | Admitting: Family Medicine

## 2023-10-22 ENCOUNTER — Other Ambulatory Visit: Payer: Self-pay | Admitting: Family Medicine

## 2023-10-22 NOTE — Telephone Encounter (Signed)
[facility] for the following medications:  lisinopril (ZESTRIL) 40 MG tablet   Please advise.

## 2023-10-23 ENCOUNTER — Other Ambulatory Visit: Payer: Self-pay | Admitting: Family Medicine

## 2023-10-23 ENCOUNTER — Telehealth: Payer: Self-pay | Admitting: Family Medicine

## 2023-10-23 DIAGNOSIS — J302 Other seasonal allergic rhinitis: Secondary | ICD-10-CM

## 2023-10-23 DIAGNOSIS — J310 Chronic rhinitis: Secondary | ICD-10-CM

## 2023-10-23 DIAGNOSIS — E1165 Type 2 diabetes mellitus with hyperglycemia: Secondary | ICD-10-CM

## 2023-10-23 MED ORDER — METFORMIN HCL ER 500 MG PO TB24
1000.0000 mg | ORAL_TABLET | Freq: Two times a day (BID) | ORAL | 1 refills | Status: AC
Start: 2023-10-23 — End: ?

## 2023-10-23 NOTE — Telephone Encounter (Signed)
[facility] ref[facility] request for the following medications:    metFORMIN (GLUCOPHAGE-XR) 500 MG 24 hr tablet   Please advise.  

## 2023-10-24 ENCOUNTER — Telehealth: Payer: Self-pay | Admitting: Family Medicine

## 2023-10-24 NOTE — Telephone Encounter (Signed)
[facility] for the following medications:  lisinopril (ZESTRIL) 40 MG tablet   Please advise.

## 2023-10-25 MED ORDER — LISINOPRIL 40 MG PO TABS
40.0000 mg | ORAL_TABLET | Freq: Every day | ORAL | 3 refills | Status: AC
Start: 2023-10-25 — End: ?

## 2023-10-25 NOTE — Telephone Encounter (Signed)
 The requested medication has been sent to the pharmacy on file.

## 2023-10-30 ENCOUNTER — Telehealth: Payer: Self-pay | Admitting: Family Medicine

## 2023-10-30 DIAGNOSIS — N529 Male erectile dysfunction, unspecified: Secondary | ICD-10-CM

## 2023-10-30 NOTE — Telephone Encounter (Signed)
 [facility] fax[facility] refill request for the following medications:  tadalafil  (CIALIS ) 20 MG tablet    Please advise.

## 2023-10-30 NOTE — Telephone Encounter (Signed)
 LOV: 08/01/2023 NOV: 11/01/2023 LRF: 10/17/2022 #[id number]

## 2023-10-31 MED ORDER — TADALAFIL 20 MG PO TABS: 20.0000 mg | ORAL_TABLET | Freq: Every day | ORAL | 11 refills | Status: AC | PRN

## 2023-10-31 NOTE — Addendum Note (Signed)
 Addended by: DONZELLA DOMINO on: 10/31/2023 04:54 PM   Modules accepted: Orders

## 2023-11-01 ENCOUNTER — Ambulatory Visit: Admitting: Family Medicine

## 2023-11-01 ENCOUNTER — Encounter: Payer: Self-pay | Admitting: Family Medicine

## 2023-11-01 VITALS — BP 131/89 | HR 85 | Ht 72.0 in | Wt 242.0 lb

## 2023-11-01 DIAGNOSIS — E1169 Type 2 diabetes mellitus with other specified complication: Secondary | ICD-10-CM

## 2023-11-01 DIAGNOSIS — E66811 Obesity, class 1: Secondary | ICD-10-CM | POA: Diagnosis not present

## 2023-11-01 DIAGNOSIS — E1159 Type 2 diabetes mellitus with other circulatory complications: Secondary | ICD-10-CM

## 2023-11-01 DIAGNOSIS — R600 Localized edema: Secondary | ICD-10-CM

## 2023-11-01 DIAGNOSIS — I152 Hypertension secondary to endocrine disorders: Secondary | ICD-10-CM

## 2023-11-01 DIAGNOSIS — B351 Tinea unguium: Secondary | ICD-10-CM

## 2023-11-01 DIAGNOSIS — Z716 Tobacco abuse counseling: Secondary | ICD-10-CM | POA: Diagnosis not present

## 2023-11-01 DIAGNOSIS — F17200 Nicotine dependence, unspecified, uncomplicated: Secondary | ICD-10-CM | POA: Diagnosis not present

## 2023-11-01 DIAGNOSIS — Z713 Dietary counseling and surveillance: Secondary | ICD-10-CM

## 2023-11-01 MED ORDER — CARVEDILOL 3.125 MG PO TABS: 3.1250 mg | ORAL_TABLET | Freq: Two times a day (BID) | ORAL | 1 refills | Status: AC

## 2023-11-01 MED ORDER — BUPROPION HCL ER (XL) 150 MG PO TB24: ORAL_TABLET | ORAL | 2 refills | Status: DC

## 2023-11-01 MED ORDER — NALTREXONE HCL 50 MG PO TABS
25.0000 mg | ORAL_TABLET | Freq: Every day | ORAL | 2 refills | Status: AC
Start: 2023-11-01 — End: ?

## 2023-11-01 MED ORDER — CICLOPIROX 8 % EX SOLN: Freq: Every day | CUTANEOUS | 6 refills | Status: AC

## 2023-11-01 NOTE — Progress Notes (Signed)
 Established patient visit   Patient: Chase Hull   DOB: 06-21-1965   58 y.o. Male  MRN: [id number] Visit Date: 11/01/2023  Today's healthcare provider: LAURAINE LOISE BUOY, DO   Chief Complaint  Patient presents with   Hypertension   Weight Loss    Discuss weight loss treatment    Nail Problem    Discuss thick R foot toenails   Subjective    Hypertension   Chase Hull is a 58 year old male who presents with concerns about weight management and thickened toenails.  He is experiencing difficulty managing his weight due to a demanding work schedule that disrupts his eating habits. He consumes excessive bread and fried foods, which he believes contributes to his weight gain. Previously, he maintained a healthier diet when on a different work shift, including eating plain oatmeal and salads, and avoiding food after 8 PM. He is trying to drink more water  and engage in physical activities such as walking, push-ups, squats, and mowing the lawn. Despite these efforts, he feels his current diet is hindering his weight loss. He wants to lose 40 pounds and is considering lifestyle changes to achieve this goal.  He is concerned about thickened toenails, particularly on the right foot, which he noticed after performing a self-manicure. The nails are hard and difficult to trim, and he uses a Dremel tool for maintenance. He reports swelling in his ankles, more pronounced on one side, but no pain. The thickening started with the big toe and spread to other nails on the same foot.  He has a history of hypertension, with recent home blood pressure readings around 130/80 mmHg. He is currently taking amlodipine  10 mg, lisinopril  40 mg and carvedilol  twice daily. He also takes glipizide  and metformin  for diabetes management. He has no history of heart attack or irregular heartbeat.  He is concerned about his weight and its impact on his health.      Medications: Outpatient Medications Prior to  Visit  Medication Sig   allopurinol  (ZYLOPRIM ) 100 MG tablet Take 2 tablets (200 mg total) by mouth daily.   amLODipine  (NORVASC ) 10 MG tablet Take 1 tablet (10 mg total) by mouth daily.   Azelastine  HCl 137 MCG/SPRAY SOLN PLACE 1-2 SPRAYS INTO THE NOSE DAILY.   Blood Glucose Monitoring Suppl (ACCU-CHEK GUIDE ME) w/Device KIT 1 kit by Does not apply route daily.   fenofibrate  (TRICOR ) 145 MG tablet Take 1 tablet (145 mg total) by mouth daily.   glipiZIDE  (GLUCOTROL ) 10 MG tablet TAKE 1 TABLET (10 MG TOTAL) BY MOUTH 2 (TWO) TIMES DAILY BEFORE A MEAL. FREQUENCY CHANGE FROM ONCE DAILY TO TWICE DAILY   glucose blood (ACCU-CHEK GUIDE TEST) test strip Use to check blood glucose daily Dx E11.65   levocetirizine (XYZAL ) 5 MG tablet Take 1 tablet (5 mg total) by mouth every evening.   lisinopril  (ZESTRIL ) 40 MG tablet Take 1 tablet (40 mg total) by mouth daily.   metFORMIN  (GLUCOPHAGE -XR) 500 MG 24 hr tablet Take 2 tablets (1,000 mg total) by mouth 2 (two) times daily with a meal. Take 2 tablets (1,000 mg total) by mouth 2 (two) times daily with a meal.   methocarbamol  (ROBAXIN ) 500 MG tablet TAKE 1 TABLET BY MOUTH EVERY 8 (EIGHT) HOURS AS NEEDED FOR MUSCLE SPASMS. THIS CAN MAKE YOU SLEEPY.   omeprazole  (PRILOSEC) 40 MG capsule Take 1 capsule (40 mg total) by mouth daily.   OneTouch Delica Lancets 33G MISC 1 each by  Does not apply route daily.   rosuvastatin  (CRESTOR ) 40 MG tablet Take 1 tablet (40 mg total) by mouth daily.   tadalafil  (CIALIS ) 20 MG tablet Take 1 tablet (20 mg total) by mouth daily as needed for erectile dysfunction.   [DISCONTINUED] carvedilol  (COREG ) 3.125 MG tablet Take 1 tablet (3.125 mg total) by mouth 2 (two) times daily with a meal.   No facility-administered medications prior to visit.        Objective    BP 131/89 (BP Location: Left Arm, Patient Position: Sitting, Cuff Size: Normal)   Pulse 85   Ht 6' (1.829 m)   Wt 242 lb (109.8 kg)   SpO2 98%   BMI 32.82 kg/m      Physical Exam Vitals and nursing note reviewed.  Constitutional:      General: He is not in acute distress.    Appearance: Normal appearance.  HENT:     Head: Normocephalic and atraumatic.  Eyes:     General: No scleral icterus.    Conjunctiva/sclera: Conjunctivae normal.  Cardiovascular:     Rate and Rhythm: Normal rate.  Pulmonary:     Effort: Pulmonary effort is normal.  Musculoskeletal:     Right lower leg: Edema (trace) present.     Left lower leg: Edema (trace) present.  Feet:     Right foot:     Skin integrity: Skin integrity normal.     Toenail Condition: Right toenails are abnormally thick. Fungal disease present. Neurological:     Mental Status: He is alert and oriented to person, place, and time. Mental status is at baseline.  Psychiatric:        Mood and Affect: Mood normal.        Behavior: Behavior normal.      No results found for any visits on 11/01/23.  Assessment & Plan    Obesity (BMI 30.0-34.9) -     buPROPion  HCl ER (XL); 150 mg orally once daily for 3 days, then increase to 300 mg/day  Dispense: 60 tablet; Refill: 2 -     Naltrexone  HCl; Take 0.5-1 tablets (25-50 mg total) by mouth daily.  Dispense: 30 tablet; Refill: 2  Weight loss counseling, encounter for -     buPROPion  HCl ER (XL); 150 mg orally once daily for 3 days, then increase to 300 mg/day  Dispense: 60 tablet; Refill: 2 -     Naltrexone  HCl; Take 0.5-1 tablets (25-50 mg total) by mouth daily.  Dispense: 30 tablet; Refill: 2  Onychomycosis of multiple toenails with type 2 diabetes mellitus (HCC) -     Ciclopirox ; Apply topically at bedtime. Apply over nail and surrounding skin. Apply daily over previous coat. After seven (7) days, may remove with alcohol and continue cycle.  Dispense: 6.6 mL; Refill: 6  Nicotine dependence with current use  Encounter for smoking cessation counseling -     buPROPion  HCl ER (XL); 150 mg orally once daily for 3 days, then increase to 300 mg/day   Dispense: 60 tablet; Refill: 2 -     Naltrexone  HCl; Take 0.5-1 tablets (25-50 mg total) by mouth daily.  Dispense: 30 tablet; Refill: 2  Hypertension associated with diabetes (HCC) -     Carvedilol ; Take 1 tablet (3.125 mg total) by mouth 2 (two) times daily with a meal.  Dispense: 180 tablet; Refill: 1  Bilateral lower extremity edema      Obesity (BMI 30.0-34.9); weight loss counseling Obesity with difficulty managing weight due to dietary habits  and work schedule changes. Active lifestyle but poor dietary choices. - Initiate bupropion /naltrexone  for weight loss and smoking cessation. - Advise dietary modifications: reduce bread and fried food intake. - Encourage oatmeal consumption and preparation strategies. - Continue regular physical activity, ensuring a minimum of 150 minutes of moderate intensity physical activity over the course of each week. - Discuss potential use of Wegovy or Zepbound; check insurance coverage. - Provide educational material on calorie counting (recommended 2500-calorie maximum per day) and portion control. - Recommend Couch to [employer]itness program for exercise.  Onychomycosis of right foot Thickened nails on right foot, likely fungal infection. Preference for topical treatment due to liver concerns with oral antifungals. - Prescribe topical antifungal medication nightly. - Discussed potential podiatry referral if needed.  Edema of lower extremities Swelling in lower extremities, possibly affecting nail health due to nutrient delivery issues.  Hypertension Chronic, stable.  Blood pressure 131/89 mmHg, slightly elevated, possibly stress-related. Discrepancy in carvedilol  dispensing records. - Send prescription for carvedilol  to ensure supply.  Patient to verify supply at home. - Monitor blood pressure at home and report readings.  Type 2 diabetes mellitus Type 2 diabetes managed with glipizide  and metformin .  No change today.  Nicotine dependence; smoking  cessation counseling Nicotine dependence (cigarettes) with interest in cessation. Bupropion  chosen for dual benefit. - Initiate bupropion  for smoking cessation.    Return in about 3 months (around 01/31/2024) for Weight, smoking cessation.      I discussed the assessment and treatment plan with the patient  The patient was provided an opportunity to ask questions and all were answered. The patient agreed with the plan and demonstrated an understanding of the instructions.   The patient was advised to call back or seek an in-person evaluation if the symptoms worsen or if the condition fails to improve as anticipated.    LAURAINE LOISE BUOY, DO  [facility] [phone] (phone) [phone] (fax)  [facility]

## 2023-11-01 NOTE — Patient Instructions (Signed)
Increase your intake of fresh/frozen fruits and vegetables.  The Mediterranean diet is a great reference for meal ideas.  I strongly encourage you to incorporate exercise into your daily routine (at least 30 minutes most days of the week) and monitor your dietary intake. - I encourage you to measure/weigh your foods/beverages for a few days to get a more accurate idea of what different amounts of things look like on your plate or in your glass.  - Using smaller plates/glasses also helps in that it tricks our minds into thinking we've eaten more than we have. Studies have shown that we eat more when presented with larger plates, even with the same amount of food on them.   - Plan to eat until you are no longer hungry, rather than until you're full.  If you don't normally exercise, start with something simple or more enjoyable. You can plan to walk for your half hour or do something like dancing if you enjoy it.  Build up your activity over time; this will make it more enjoyable and reduce your risk of injury.  Here are some videos which offer a variety of different workout classes with different levels: [email or web address]  It is completely free. If a full video is too much for you to do, you can do them in bite-sized chunks (just pausing when needed and resuming later in the day).   Even if these actions don't ultimately lead to weight loss, increasing your activity will still help to reduce your insulin resistance and will improve your cardiovascular health (making heart attack/stroke less likely as you age).

## 2023-11-20 ENCOUNTER — Telehealth: Payer: Self-pay | Admitting: Family Medicine

## 2023-11-20 ENCOUNTER — Other Ambulatory Visit: Payer: Self-pay

## 2023-11-20 MED ORDER — AMLODIPINE BESYLATE 10 MG PO TABS: 10.0000 mg | ORAL_TABLET | Freq: Every day | ORAL | 3 refills | Status: AC

## 2023-11-20 NOTE — Telephone Encounter (Signed)
 We received refill request from [facility] at [address], [place], [place] [address]. Phone: [phone] Amlodipine  Besylate 10MG  Tab QTY: 90

## 2023-12-05 ENCOUNTER — Other Ambulatory Visit: Payer: Self-pay | Admitting: Family Medicine

## 2023-12-05 DIAGNOSIS — Z716 Tobacco abuse counseling: Secondary | ICD-10-CM

## 2023-12-05 DIAGNOSIS — E66811 Obesity, class 1: Secondary | ICD-10-CM

## 2023-12-05 DIAGNOSIS — Z713 Dietary counseling and surveillance: Secondary | ICD-10-CM

## 2023-12-10 ENCOUNTER — Inpatient Hospital Stay
Admission: EM | Admit: 2023-12-10 | Discharge: 2023-12-12 | DRG: 683 | Disposition: A | Discharge status: Home / Self Care | Attending: Internal Medicine | Admitting: Internal Medicine

## 2023-12-10 ENCOUNTER — Emergency Department

## 2023-12-10 ENCOUNTER — Other Ambulatory Visit: Payer: Self-pay

## 2023-12-10 DIAGNOSIS — E1165 Type 2 diabetes mellitus with hyperglycemia: Secondary | ICD-10-CM | POA: Diagnosis present

## 2023-12-10 DIAGNOSIS — E1159 Type 2 diabetes mellitus with other circulatory complications: Secondary | ICD-10-CM | POA: Diagnosis not present

## 2023-12-10 DIAGNOSIS — Z7984 Long term (current) use of oral hypoglycemic drugs: Secondary | ICD-10-CM

## 2023-12-10 DIAGNOSIS — M48061 Spinal stenosis, lumbar region without neurogenic claudication: Secondary | ICD-10-CM | POA: Diagnosis not present

## 2023-12-10 DIAGNOSIS — E872 Acidosis, unspecified: Secondary | ICD-10-CM | POA: Diagnosis not present

## 2023-12-10 DIAGNOSIS — E66811 Obesity, class 1: Secondary | ICD-10-CM | POA: Diagnosis not present

## 2023-12-10 DIAGNOSIS — E871 Hypo-osmolality and hyponatremia: Secondary | ICD-10-CM | POA: Diagnosis present

## 2023-12-10 DIAGNOSIS — I700 Atherosclerosis of aorta: Secondary | ICD-10-CM | POA: Diagnosis present

## 2023-12-10 DIAGNOSIS — E785 Hyperlipidemia, unspecified: Secondary | ICD-10-CM | POA: Diagnosis present

## 2023-12-10 DIAGNOSIS — A044 Other intestinal Escherichia coli infections: Secondary | ICD-10-CM | POA: Diagnosis not present

## 2023-12-10 DIAGNOSIS — Z860101 Personal history of adenomatous and serrated colon polyps: Secondary | ICD-10-CM

## 2023-12-10 DIAGNOSIS — I152 Hypertension secondary to endocrine disorders: Secondary | ICD-10-CM | POA: Diagnosis present

## 2023-12-10 DIAGNOSIS — Z888 Allergy status to other drugs, medicaments and biological substances status: Secondary | ICD-10-CM

## 2023-12-10 DIAGNOSIS — Z6831 Body mass index (BMI) 31.0-31.9, adult: Secondary | ICD-10-CM

## 2023-12-10 DIAGNOSIS — R197 Diarrhea, unspecified: Principal | ICD-10-CM | POA: Diagnosis present

## 2023-12-10 DIAGNOSIS — N179 Acute kidney failure, unspecified: Secondary | ICD-10-CM | POA: Diagnosis present

## 2023-12-10 DIAGNOSIS — G894 Chronic pain syndrome: Secondary | ICD-10-CM | POA: Diagnosis not present

## 2023-12-10 DIAGNOSIS — Z79899 Other long term (current) drug therapy: Secondary | ICD-10-CM | POA: Diagnosis not present

## 2023-12-10 DIAGNOSIS — E860 Dehydration: Secondary | ICD-10-CM | POA: Diagnosis present

## 2023-12-10 DIAGNOSIS — Z604 Social exclusion and rejection: Secondary | ICD-10-CM | POA: Diagnosis present

## 2023-12-10 DIAGNOSIS — R748 Abnormal levels of other serum enzymes: Secondary | ICD-10-CM | POA: Diagnosis not present

## 2023-12-10 DIAGNOSIS — F17210 Nicotine dependence, cigarettes, uncomplicated: Secondary | ICD-10-CM | POA: Diagnosis not present

## 2023-12-10 DIAGNOSIS — K760 Fatty (change of) liver, not elsewhere classified: Secondary | ICD-10-CM | POA: Diagnosis present

## 2023-12-10 DIAGNOSIS — M109 Gout, unspecified: Secondary | ICD-10-CM | POA: Diagnosis present

## 2023-12-10 LAB — GLUCOSE, CAPILLARY
Glucose-Capillary: 156 mg/dL — ABNORMAL HIGH (ref 70–99)
Glucose-Capillary: 212 mg/dL — ABNORMAL HIGH (ref 70–99)

## 2023-12-10 LAB — GASTROINTESTINAL PANEL BY PCR, STOOL (REPLACES STOOL CULTURE)

## 2023-12-10 LAB — URINALYSIS, ROUTINE W REFLEX MICROSCOPIC
Bilirubin Urine: NEGATIVE
Glucose, UA: NEGATIVE mg/dL
Hgb urine dipstick: NEGATIVE
Ketones, ur: NEGATIVE mg/dL
Leukocytes,Ua: NEGATIVE
Nitrite: NEGATIVE
Protein, ur: NEGATIVE mg/dL
Specific Gravity, Urine: 1.018 (ref 1.005–1.030)
pH: 5 (ref 5.0–8.0)

## 2023-12-10 LAB — CBC
HCT: 43.7 % (ref 39.0–52.0)
Hemoglobin: 14.4 g/dL (ref 13.0–17.0)
MCH: 30 pg (ref 26.0–34.0)
MCHC: 33 g/dL (ref 30.0–36.0)
MCV: 91 fL (ref 80.0–100.0)
Platelets: 327 K/uL (ref 150–400)
RBC: 4.8 MIL/uL (ref 4.22–5.81)
RDW: 13.8 % (ref 11.5–15.5)
WBC: 9.3 K/uL (ref 4.0–10.5)
nRBC: 0 % (ref 0.0–0.2)

## 2023-12-10 LAB — COMPREHENSIVE METABOLIC PANEL WITH GFR
ALT: 21 U/L (ref 0–44)
AST: 17 U/L (ref 15–41)
Albumin: 4.3 g/dL (ref 3.5–5.0)
Alkaline Phosphatase: 80 U/L (ref 38–126)
Anion gap: 10 (ref 5–15)
BUN: 63 mg/dL — ABNORMAL HIGH (ref 6–20)
CO2: 14 mmol/L — ABNORMAL LOW (ref 22–32)
Calcium: 10.5 mg/dL — ABNORMAL HIGH (ref 8.9–10.3)
Chloride: 109 mmol/L (ref 98–111)
Creatinine, Ser: 2.33 mg/dL — ABNORMAL HIGH (ref 0.61–1.24)
GFR, Estimated: 32 mL/min — ABNORMAL LOW (ref >60)
Glucose, Bld: 244 mg/dL — ABNORMAL HIGH (ref 70–99)
Potassium: 4.1 mmol/L (ref 3.5–5.1)
Sodium: 133 mmol/L — ABNORMAL LOW (ref 135–145)
Total Bilirubin: 0.6 mg/dL (ref 0.0–1.2)
Total Protein: 7.8 g/dL (ref 6.5–8.1)

## 2023-12-10 LAB — LIPASE, BLOOD: Lipase: 387 U/L — ABNORMAL HIGH (ref 11–51)

## 2023-12-10 MED ORDER — ENOXAPARIN SODIUM 60 MG/0.6ML IJ SOSY
50.0000 mg | PREFILLED_SYRINGE | INTRAMUSCULAR | Status: DC
  Filled 2023-12-10 (×2): qty 0.6

## 2023-12-10 MED ORDER — SODIUM CHLORIDE 0.9 % IV BOLUS
1000.0000 mL | Freq: Once | INTRAVENOUS | Status: AC
  Administered 2023-12-10: 1000 mL via INTRAVENOUS

## 2023-12-10 MED ORDER — ACETAMINOPHEN 650 MG RE SUPP: 650.0000 mg | Freq: Four times a day (QID) | RECTAL | Status: DC | PRN

## 2023-12-10 MED ORDER — INSULIN ASPART 100 UNIT/ML IJ SOLN
0.0000 [IU] | Freq: Three times a day (TID) | INTRAMUSCULAR | Status: DC
  Administered 2023-12-10 – 2023-12-12 (×4): 2 [IU] via SUBCUTANEOUS
  Filled 2023-12-10 (×4): qty 1

## 2023-12-10 MED ORDER — ONDANSETRON HCL 4 MG PO TABS: 4.0000 mg | ORAL_TABLET | Freq: Four times a day (QID) | ORAL | Status: DC | PRN

## 2023-12-10 MED ORDER — AZELASTINE HCL 0.1 % NA SOLN
1.0000 | Freq: Every day | NASAL | Status: DC
  Administered 2023-12-11: 2 via NASAL
  Administered 2023-12-12: 1 via NASAL
  Filled 2023-12-10 (×2): qty 30

## 2023-12-10 MED ORDER — ACETAMINOPHEN 325 MG PO TABS: 650.0000 mg | ORAL_TABLET | Freq: Four times a day (QID) | ORAL | Status: DC | PRN

## 2023-12-10 MED ORDER — ONDANSETRON HCL 4 MG/2ML IJ SOLN: 4.0000 mg | Freq: Four times a day (QID) | INTRAMUSCULAR | Status: DC | PRN

## 2023-12-10 MED ORDER — NICOTINE 21 MG/24HR TD PT24
21.0000 mg | MEDICATED_PATCH | Freq: Every day | TRANSDERMAL | Status: DC
Start: 2023-12-10 — End: 2023-12-12
  Administered 2023-12-10 – 2023-12-12 (×3): 21 mg via TRANSDERMAL
  Filled 2023-12-10 (×3): qty 1

## 2023-12-10 MED ORDER — SODIUM CHLORIDE 0.9 % IV BOLUS
1000.0000 mL | Freq: Once | INTRAVENOUS | Status: AC
Start: 2023-12-10 — End: 2023-12-10
  Administered 2023-12-10: 1000 mL via INTRAVENOUS

## 2023-12-10 MED ORDER — LACTATED RINGERS IV SOLN: INTRAVENOUS | Status: DC

## 2023-12-10 NOTE — ED Provider Notes (Signed)
 [facility] Provider Note    Event Date/Time   First MD Initiated Contact with Patient 12/10/23 1347     (approximate)   History   Diarrhea   HPI  Chase Hull is a 58 y.o. male   who presents to the emergency department today with concerns for significant diarrhea.  Symptoms have been present for 1 week.  He states anytime he tries to eat or drink he has diarrhea.  He has had some nausea but no vomiting.  Denies any fevers chills.  Denies any abnormal ingestion prior to his symptoms starting.  Denies similar symptoms in the past.      Physical Exam   Triage Vital Signs: ED Triage Vitals  Encounter Vitals Group     BP 12/10/23 1228 122/82     Girls Systolic BP Percentile --      Girls Diastolic BP Percentile --      Boys Systolic BP Percentile --      Boys Diastolic BP Percentile --      Pulse Rate 12/10/23 1228 100     Resp 12/10/23 1228 18     Temp 12/10/23 1228 98.7 F (37.1 C)     Temp src --      SpO2 12/10/23 1228 97 %     Weight 12/10/23 1228 229 lb (103.9 kg)     Height 12/10/23 1228 6' (1.829 m)     Head Circumference --      Peak Flow --      Pain Score 12/10/23 1227 5     Pain Loc --      Pain Education --      Exclude from Growth Chart --     Most recent vital signs: Vitals:   12/10/23 1228 12/10/23 1344  BP: 122/82 122/82  Pulse: 100 84  Resp: 18 16  Temp: 98.7 F (37.1 C)   SpO2: 97% 100%   General: Awake, alert, oriented. CV:  Good peripheral perfusion. Regular rate and rhythm. Resp:  Normal effort. Lungs clear. Abd:  No distention. Mild periumbilical tenderness.  ED Results / Procedures / Treatments   Labs (all labs ordered are listed, but only abnormal results are displayed) Labs Reviewed  LIPASE, BLOOD - Abnormal; Notable for the following components:      Result Value   Lipase 387 (*)    All other components within normal limits  COMPREHENSIVE METABOLIC PANEL WITH GFR - Abnormal; Notable for the  following components:   Sodium 133 (*)    CO2 14 (*)    Glucose, Bld 244 (*)    BUN 63 (*)    Creatinine, Ser 2.33 (*)    Calcium  10.5 (*)    GFR, Estimated 32 (*)    All other components within normal limits  URINALYSIS, ROUTINE W REFLEX MICROSCOPIC - Abnormal; Notable for the following components:   Color, Urine YELLOW (*)    APPearance CLEAR (*)    All other components within normal limits  GASTROINTESTINAL PANEL BY PCR, STOOL (REPLACES STOOL CULTURE)  CBC     EKG  None   RADIOLOGY I independently interpreted and visualized the CT abd/pel. My interpretation: No free air Radiology interpretation:  IMPRESSION:  1. No acute findings in the abdomen or pelvis; normal appendix.  2. Hepatic steatosis.  3. Aortic atherosclerotic calcification.     PROCEDURES:  Critical Care performed: No    MEDICATIONS ORDERED IN ED: Medications  sodium chloride  0.9 % bolus 1,000 mL (  has no administration in time range)  sodium chloride  0.9 % bolus 1,000 mL (1,000 mLs Intravenous New Bag/Given 12/10/23 1422)     IMPRESSION / MDM / ASSESSMENT AND PLAN / ED COURSE  I reviewed the triage vital signs and the nursing notes.                              Differential diagnosis includes, but is not limited to, dehydration, electrolyte abnormality, viral illness, diverticulitis  Patient's presentation is most consistent with acute presentation with potential threat to life or bodily function.   Patient presented to the emergency department today because of concerns for diarrhea for 1 week.  Blood work here does show significant AKI.  No significant electrolyte abnormalities.  Did obtain a CT scan given slightly elevated lipase and tenderness.  This did not show any concerning abnormalities.  Patient was started on IV fluids.  Will discuss with hospitalist service for admission.     FINAL CLINICAL IMPRESSION(S) / ED DIAGNOSES   Final diagnoses:  Diarrhea, unspecified type  AKI (acute  kidney injury)       Note:  This document was prepared using Dragon voice recognition software and may include unintentional dictation errors.    Floy Roberts, MD 12/10/23 [phone]

## 2023-12-10 NOTE — ED Triage Notes (Signed)
 Pt comes with diarrhea and belly pain for a week. Pt states it has gotten worse and not letting up so he came on in. Pt states no vomiting but states nausea.

## 2023-12-10 NOTE — ED Triage Notes (Signed)
 First Nurse Note: Patient to ED from [facility] for diarrhea and abd cramping x1 week. Having light blood when wiping.
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
## 2023-12-11 DIAGNOSIS — Z6831 Body mass index (BMI) 31.0-31.9, adult: Secondary | ICD-10-CM | POA: Diagnosis not present

## 2023-12-11 DIAGNOSIS — E1159 Type 2 diabetes mellitus with other circulatory complications: Secondary | ICD-10-CM | POA: Diagnosis present

## 2023-12-11 DIAGNOSIS — Z7984 Long term (current) use of oral hypoglycemic drugs: Secondary | ICD-10-CM | POA: Diagnosis not present

## 2023-12-11 DIAGNOSIS — Z860101 Personal history of adenomatous and serrated colon polyps: Secondary | ICD-10-CM | POA: Diagnosis not present

## 2023-12-11 DIAGNOSIS — M48061 Spinal stenosis, lumbar region without neurogenic claudication: Secondary | ICD-10-CM | POA: Diagnosis present

## 2023-12-11 DIAGNOSIS — K760 Fatty (change of) liver, not elsewhere classified: Secondary | ICD-10-CM | POA: Diagnosis present

## 2023-12-11 DIAGNOSIS — Z79899 Other long term (current) drug therapy: Secondary | ICD-10-CM | POA: Diagnosis not present

## 2023-12-11 DIAGNOSIS — E871 Hypo-osmolality and hyponatremia: Secondary | ICD-10-CM | POA: Diagnosis present

## 2023-12-11 DIAGNOSIS — A044 Other intestinal Escherichia coli infections: Secondary | ICD-10-CM | POA: Diagnosis present

## 2023-12-11 DIAGNOSIS — G894 Chronic pain syndrome: Secondary | ICD-10-CM | POA: Diagnosis present

## 2023-12-11 DIAGNOSIS — Z888 Allergy status to other drugs, medicaments and biological substances status: Secondary | ICD-10-CM | POA: Diagnosis not present

## 2023-12-11 DIAGNOSIS — F17210 Nicotine dependence, cigarettes, uncomplicated: Secondary | ICD-10-CM | POA: Diagnosis present

## 2023-12-11 DIAGNOSIS — E1165 Type 2 diabetes mellitus with hyperglycemia: Secondary | ICD-10-CM | POA: Diagnosis present

## 2023-12-11 DIAGNOSIS — M109 Gout, unspecified: Secondary | ICD-10-CM | POA: Diagnosis present

## 2023-12-11 DIAGNOSIS — E66811 Obesity, class 1: Secondary | ICD-10-CM | POA: Diagnosis present

## 2023-12-11 DIAGNOSIS — R748 Abnormal levels of other serum enzymes: Secondary | ICD-10-CM | POA: Diagnosis not present

## 2023-12-11 DIAGNOSIS — I700 Atherosclerosis of aorta: Secondary | ICD-10-CM | POA: Diagnosis present

## 2023-12-11 DIAGNOSIS — N179 Acute kidney failure, unspecified: Secondary | ICD-10-CM | POA: Diagnosis present

## 2023-12-11 DIAGNOSIS — Z604 Social exclusion and rejection: Secondary | ICD-10-CM | POA: Diagnosis present

## 2023-12-11 DIAGNOSIS — E860 Dehydration: Secondary | ICD-10-CM | POA: Diagnosis present

## 2023-12-11 DIAGNOSIS — E872 Acidosis, unspecified: Secondary | ICD-10-CM | POA: Diagnosis present

## 2023-12-11 DIAGNOSIS — E785 Hyperlipidemia, unspecified: Secondary | ICD-10-CM | POA: Diagnosis present

## 2023-12-11 DIAGNOSIS — I152 Hypertension secondary to endocrine disorders: Secondary | ICD-10-CM | POA: Diagnosis present

## 2023-12-11 DIAGNOSIS — R197 Diarrhea, unspecified: Secondary | ICD-10-CM | POA: Diagnosis not present

## 2023-12-11 LAB — RENAL FUNCTION PANEL
Albumin: 3.5 g/dL (ref 3.5–5.0)
Anion gap: 12 (ref 5–15)
BUN: 38 mg/dL — ABNORMAL HIGH (ref 6–20)
CO2: 15 mmol/L — ABNORMAL LOW (ref 22–32)
Calcium: 9.8 mg/dL (ref 8.9–10.3)
Chloride: 111 mmol/L (ref 98–111)
Creatinine, Ser: 1.62 mg/dL — ABNORMAL HIGH (ref 0.61–1.24)
GFR, Estimated: 50 mL/min — ABNORMAL LOW (ref >60)
Glucose, Bld: 149 mg/dL — ABNORMAL HIGH (ref 70–99)
Phosphorus: 2.6 mg/dL (ref 2.5–4.6)
Potassium: 4.1 mmol/L (ref 3.5–5.1)
Sodium: 138 mmol/L (ref 135–145)

## 2023-12-11 LAB — CBC
HCT: 37.7 % — ABNORMAL LOW (ref 39.0–52.0)
Hemoglobin: 12.8 g/dL — ABNORMAL LOW (ref 13.0–17.0)
MCH: 30.5 pg (ref 26.0–34.0)
MCHC: 34 g/dL (ref 30.0–36.0)
MCV: 89.8 fL (ref 80.0–100.0)
Platelets: 294 K/uL (ref 150–400)
RBC: 4.2 MIL/uL — ABNORMAL LOW (ref 4.22–5.81)
RDW: 14.2 % (ref 11.5–15.5)
WBC: 8 K/uL (ref 4.0–10.5)
nRBC: 0 % (ref 0.0–0.2)

## 2023-12-11 LAB — GLUCOSE, CAPILLARY
Glucose-Capillary: 200 mg/dL — ABNORMAL HIGH (ref 70–99)
Glucose-Capillary: 155 mg/dL — ABNORMAL HIGH (ref 70–99)
Glucose-Capillary: 109 mg/dL — ABNORMAL HIGH (ref 70–99)
Glucose-Capillary: 214 mg/dL — ABNORMAL HIGH (ref 70–99)

## 2023-12-11 LAB — HIV ANTIBODY (ROUTINE TESTING W REFLEX): HIV Screen 4th Generation wRfx: NONREACTIVE

## 2023-12-11 LAB — LIPASE, BLOOD: Lipase: 420 U/L — ABNORMAL HIGH (ref 11–51)

## 2023-12-11 LAB — MAGNESIUM: Magnesium: 2 mg/dL (ref 1.7–2.4)

## 2023-12-11 MED ORDER — LACTATED RINGERS IV SOLN: INTRAVENOUS | Status: AC

## 2023-12-11 MED ORDER — SODIUM BICARBONATE 650 MG PO TABS
1300.0000 mg | ORAL_TABLET | Freq: Two times a day (BID) | ORAL | Status: AC
  Administered 2023-12-11 (×2): 1300 mg via ORAL
  Filled 2023-12-11 (×2): qty 2

## 2023-12-11 NOTE — Plan of Care (Signed)
  Problem: Education: Goal: Knowledge of General Education information will improve Description: Including pain rating scale, medication(s)/side effects and non-pharmacologic comfort measures Outcome: Progressing   Problem: Health Behavior/Discharge Planning: Goal: Ability to manage health-related needs will improve Outcome: Progressing   Problem: Clinical Measurements: Goal: Ability to maintain clinical measurements within normal limits will improve Outcome: Progressing Goal: Will remain free from infection Outcome: Progressing Goal: Diagnostic test results will improve Outcome: Progressing Goal: Respiratory complications will improve Outcome: Progressing Goal: Cardiovascular complication will be avoided Outcome: Progressing   Problem: Activity: Goal: Risk for activity intolerance will decrease Outcome: Progressing   Problem: Nutrition: Goal: Adequate nutrition will be maintained Outcome: Progressing   Problem: Coping: Goal: Level of anxiety will decrease Outcome: Progressing   Problem: Elimination: Goal: Will not experience complications related to bowel motility Outcome: Progressing Goal: Will not experience complications related to urinary retention Outcome: Progressing   Problem: Pain Managment: Goal: General experience of comfort will improve and/or be controlled Outcome: Progressing   Problem: Safety: Goal: Ability to remain free from injury will improve Outcome: Progressing   Problem: Skin Integrity: Goal: Risk for impaired skin integrity will decrease Outcome: Progressing   Problem: Coping: Goal: Ability to adjust to condition or change in health will improve Outcome: Progressing   Problem: Education: Goal: Ability to describe self-care measures that may prevent or decrease complications (Diabetes Survival Skills Education) will improve Outcome: Progressing Goal: Individualized Educational Video(s) Outcome: Progressing   Problem: Fluid  Volume: Goal: Ability to maintain a balanced intake and output will improve Outcome: Progressing   Problem: Nutritional: Goal: Maintenance of adequate nutrition will improve Outcome: Progressing Goal: Progress toward achieving an optimal weight will improve Outcome: Progressing   Problem: Metabolic: Goal: Ability to maintain appropriate glucose levels will improve Outcome: Progressing   Problem: Skin Integrity: Goal: Risk for impaired skin integrity will decrease Outcome: Progressing   Problem: Tissue Perfusion: Goal: Adequacy of tissue perfusion will improve Outcome: Progressing

## 2023-12-11 NOTE — TOC CM/SW Note (Signed)
 Transition of Care [facility]) - Inpatient Brief Assessment   Patient Details  Name: Chase Hull MRN: [id number] Date of Birth: March 31, 1965  Transition of Care [facility]) CM/SW Contact:    Daved JONETTA Hamilton, RN Phone Number: 12/11/2023, 9:19 AM   Clinical Narrative:   Transition of Care [facility]) Screening Note   Patient Details  Name: Chase Hull Date of Birth: Sep 06, 1965   Transition of Care [facility]) CM/SW Contact:    Daved JONETTA Hamilton, RN Phone Number: 12/11/2023, 9:20 AM    Transition of Care Department [facility]) has reviewed patient and no TOC needs have been identified at this time. If new patient transition needs arise, please place a TOC consult.     Transition of Care Asessment: Insurance and Status: Insurance coverage has been reviewed Patient has primary care physician: Yes     Prior/Current Home Services: No current home services Social Drivers of Health Review: SDOH reviewed no interventions necessary Readmission risk has been reviewed: No (currently on observation status, no score generated) Transition of care needs: no transition of care needs at this time

## 2023-12-11 NOTE — Plan of Care (Signed)

## 2023-12-11 NOTE — Progress Notes (Addendum)
 Progress Note    Chase Hull  FMW:[id number] DOB: [birth year]  DOA: 12/10/2023 PCP: Donzella Lauraine SAILOR, DO      Brief Narrative:    Medical records reviewed and are as summarized below:  Chase Hull is a 58 y.o. male with medical history significant for remote history of bleeding peptic ulcer disease in his 38s,  hypertension, hyperlipidemia, type II DM, lumbar spinal stenosis, degenerative disc disease lumbar spine, chronic pain syndrome, gout, scented to the hospital with 1 week history of diarrhea.  He has been having multiple watery stools every day for about a week.  He has lost count with the diarrhea.  It is associated with crampy abdominal pain around the mid abdomen and lower abdomen.  Stools were yellowish-green in color.  However, he noticed that his rectal area had become sore from excessive wiping.  He started using wipes after defecation.  He later noticed that there was bright red blood on the wipes.  He has no history of hemorrhoids.  He has nausea but no vomiting.  He has poor oral intake and whenever he eats or drinks anything everything comes right out of him.  He has had chills but no fever.  He noticed decreased urine output and urine is yellowish in color.  He has an occasional cough but nothing significant.  No shortness of breath or chest pain.   ED Course: Vital signs in the ED: Temperature 98.7 F, respiratory rate 18, BP 122/82, O2 sat 97% on room air.   Labs significant for sodium 133, bicarb 14, glucose 244, BUN 63, creatinine 2.33, calcium  10.5, lipase 387, GFR 32.  CBC was unremarkable.   CT abdomen pelvis IMPRESSION: 1. No acute findings in the abdomen or pelvis; normal appendix. 2. Hepatic steatosis. 3. Aortic atherosclerotic calcification.      Assessment/Plan:   Principal Problem:   AKI (acute kidney injury) Active Problems:   Type 2 diabetes mellitus with hyperglycemia (HCC)   Acute diarrhea   Elevated lipase   Hyponatremia   E. coli  enteritis    Body mass index is 31.06 kg/m.   Acute kidney injury with normal anion gap metabolic acidosis: He is slowly improving.  Creatinine down from 2.33-1.62.  Continue IV "Ringer's"  lactate infusion and monitor BMP.   Bicarb only went up from 14-15. Start oral sodium bicarbonate.     Acute diarrheal illness, E. coli diarrhea: Improved.  Stool for GI panel was positive for and toxigenic E. coli.  Stool for C. difficile toxin was negative.  Stool for ova and parasites pending.  Imodium as needed for diarrhea.       Elevated lipase (387): Lipase up from 387-420.  Differential diagnosis include acute pancreatitis. However, suspect elevated lipase may be due to acute gastroenteritis and AKI.  Monitor lipase level. No vomiting or abdominal pain at the moment.       Hyponatremia and hypercalcemia: Improved: Likely from dehydration.       Type II DM with hyperglycemia: Hold glipizide  and metformin .  Use NovoLog as needed for hyperglycemia.     Comorbidities include hepatic steatosis, hypertension, hyperlipidemia, chronic back pain            Diet Order             Diet Carb Modified Fluid consistency: Thin  Diet effective now  Consultants: None  Procedures: None    Medications:    azelastine   1-2 spray Each Nare Daily   enoxaparin (LOVENOX) injection  50 mg Subcutaneous Q24H   insulin aspart  0-9 Units Subcutaneous TID WC   nicotine  21 mg Transdermal Daily   sodium bicarbonate  1,300 mg Oral BID   Continuous Infusions:  lactated ringers  75 mL/hr at 12/11/23 1104     Anti-infectives (From admission, onward)    None              Family Communication/Anticipated D/C date and plan/Code Status   DVT prophylaxis:      Code Status: Full Code  Family Communication: None Disposition Plan: Plan to discharge home   Status is: Observation The patient will require care spanning > 2 midnights  and should be moved to inpatient because: AKI       Subjective:   Interval events noted.  He feels better today.  He is passing gas but no diarrhea, vomiting or abdominal pain.  No rectal bleeding.  Objective:    Vitals:   12/10/23 1630 12/10/23 2030 12/11/23 0417 12/11/23 0805  BP: 105/81 (!) 131/90 126/88 127/85  Pulse: 90 86 85 86  Resp: 18 20 16 16   Temp:  98.4 F (36.9 C) 97.8 F (36.6 C) 98 F (36.7 C)  SpO2: 100% 100% 100% 100%  Weight:      Height:       No data found.   Intake/Output Summary (Last 24 hours) at 12/11/2023 1309 Last data filed at 12/11/2023 0410 Gross per 24 hour  Intake 2513.89 ml  Output --  Net 2513.89 ml   Filed Weights   12/10/23 1228  Weight: 103.9 kg    Exam:  GEN: NAD SKIN: Warm and dry EYES: No pallor or icterus ENT: MMM CV: RRR PULM: CTA B ABD: soft, obese, NT, +BS CNS: AAO x 3, non focal EXT: No edema or tenderness        Data Reviewed:   I have personally reviewed following labs and imaging studies:  Labs: Labs show the following:   Basic Metabolic Panel: Recent Labs  Lab 12/10/23 1224 12/11/23 0550  NA 133* 138  K 4.1 4.1  CL 109 111  CO2 14* 15*  GLUCOSE 244* 149*  BUN 63* 38*  CREATININE 2.33* 1.62*  CALCIUM  10.5* 9.8  MG  --  2.0  PHOS  --  2.6   GFR Estimated Creatinine Clearance: 64.2 mL/min (A) (by C-G formula based on SCr of 1.62 mg/dL (H)). Liver Function Tests: Recent Labs  Lab 12/10/23 1224 12/11/23 0550  AST 17  --   ALT 21  --   ALKPHOS 80  --   BILITOT 0.6  --   PROT 7.8  --   ALBUMIN 4.3 3.5   Recent Labs  Lab 12/10/23 1224 12/11/23 0550  LIPASE 387* 420*   No results for input(s): AMMONIA in the last 168 hours. Coagulation profile No results for input(s): INR, PROTIME in the last 168 hours.  CBC: Recent Labs  Lab 12/10/23 1224 12/11/23 0550  WBC 9.3 8.0  HGB 14.4 12.8*  HCT 43.7 37.7*  MCV 91.0 89.8  PLT 327 294   Cardiac Enzymes: No results  for input(s): CKTOTAL, CKMB, CKMBINDEX, TROPONINI in the last 168 hours. BNP (last 3 results) No results for input(s): PROBNP in the last 8760 hours. CBG: Recent Labs  Lab 12/10/23 1745 12/10/23 2140 12/11/23 0807 12/11/23 1143  GLUCAP 156* 212* 200*  155*   D-Dimer: No results for input(s): DDIMER in the last 72 hours. Hgb A1c: No results for input(s): HGBA1C in the last 72 hours. Lipid Profile: No results for input(s): CHOL, HDL, LDLCALC, TRIG, CHOLHDL, LDLDIRECT in the last 72 hours. Thyroid function studies: No results for input(s): TSH, T4TOTAL, T3FREE, THYROIDAB in the last 72 hours.  Invalid input(s): FREET3 Anemia work up: No results for input(s): VITAMINB12, FOLATE, FERRITIN, TIBC, IRON, RETICCTPCT in the last 72 hours. Sepsis Labs: Recent Labs  Lab 12/10/23 1224 12/11/23 0550  WBC 9.3 8.0    Microbiology Recent Results (from the past 240 hours)  Gastrointestinal Panel by PCR , Stool     Status: Abnormal   Collection Time: 12/10/23  8:27 PM   Specimen: Stool  Result Value Ref Range Status   Campylobacter species NOT DETECTED NOT DETECTED Final   Plesimonas shigelloides NOT DETECTED NOT DETECTED Final   Salmonella species NOT DETECTED NOT DETECTED Final   Yersinia enterocolitica NOT DETECTED NOT DETECTED Final   Vibrio species NOT DETECTED NOT DETECTED Final   Vibrio cholerae NOT DETECTED NOT DETECTED Final   Enteroaggregative E coli (EAEC) NOT DETECTED NOT DETECTED Final   Enteropathogenic E coli (EPEC) NOT DETECTED NOT DETECTED Final   Enterotoxigenic E coli (ETEC) DETECTED (A) NOT DETECTED Final    Comment: RESULT CALLED TO, READ BACK BY AND VERIFIED WITH: KENDALL OVERTON RN @2251  12/10/23 ASW    Shiga like toxin producing E coli (STEC) NOT DETECTED NOT DETECTED Final   Shigella/Enteroinvasive E coli (EIEC) NOT DETECTED NOT DETECTED Final   Cryptosporidium NOT DETECTED NOT DETECTED Final   Cyclospora  cayetanensis NOT DETECTED NOT DETECTED Final   Entamoeba histolytica NOT DETECTED NOT DETECTED Final   Giardia lamblia NOT DETECTED NOT DETECTED Final   Adenovirus F40/41 NOT DETECTED NOT DETECTED Final   Astrovirus NOT DETECTED NOT DETECTED Final   Norovirus GI/GII NOT DETECTED NOT DETECTED Final   Rotavirus A NOT DETECTED NOT DETECTED Final   Sapovirus (I, II, IV, and V) NOT DETECTED NOT DETECTED Final    Comment: Performed at [facility], [address] [place], [place] [address]    Procedures and diagnostic studies:  CT ABDOMEN PELVIS WO CONTRAST Result Date: 12/10/2023 EXAM: CT ABDOMEN AND PELVIS WITHOUT CONTRAST 12/10/2023 02:35:52 PM TECHNIQUE: CT of the abdomen and pelvis was performed without the administration of intravenous contrast. Multiplanar reformatted images are provided for review. Automated exposure control, iterative reconstruction, and/or weight-based adjustment of the mA/kV was utilized to reduce the radiation dose to as low as reasonably achievable. COMPARISON: None available. CLINICAL HISTORY: periumbilical pain. Abdominal Pain diarrhea x 1 week. FINDINGS: LOWER CHEST: No acute abnormality. LIVER: Hepatic steatosis. GALLBLADDER AND BILE DUCTS: Gallbladder is unremarkable. No biliary ductal dilatation. SPLEEN: No acute abnormality. PANCREAS: No acute abnormality. ADRENAL GLANDS: No acute abnormality. KIDNEYS, URETERS AND BLADDER: No stones in the kidneys or ureters. No hydronephrosis. No perinephric or periureteral stranding. Urinary bladder is unremarkable. GI AND BOWEL: Stomach demonstrates no acute abnormality. There is no bowel obstruction. Normal appendix. PERITONEUM AND RETROPERITONEUM: No ascites. No free air. VASCULATURE: Aorta is normal in caliber. Aortic atherosclerotic calcification. LYMPH NODES: No lymphadenopathy. REPRODUCTIVE ORGANS: No acute abnormality. BONES AND SOFT TISSUES: No acute osseous abnormality. No focal soft tissue abnormality. IMPRESSION:  1. No acute findings in the abdomen or pelvis; normal appendix. 2. Hepatic steatosis. 3. Aortic atherosclerotic calcification. Electronically signed by: Norman Gatlin MD 12/10/2023 02:58 PM EDT RP Workstation: [id number]  LOS: 0 days   Bertram Haddix  [facility]   Pager on [email or web address]. If 7PM-7AM, please contact night-coverage at [email or web address]     12/11/2023, 1:09 PM

## 2023-12-12 DIAGNOSIS — E871 Hypo-osmolality and hyponatremia: Secondary | ICD-10-CM

## 2023-12-12 DIAGNOSIS — A044 Other intestinal Escherichia coli infections: Secondary | ICD-10-CM

## 2023-12-12 DIAGNOSIS — N179 Acute kidney failure, unspecified: Secondary | ICD-10-CM | POA: Diagnosis not present

## 2023-12-12 DIAGNOSIS — R748 Abnormal levels of other serum enzymes: Secondary | ICD-10-CM

## 2023-12-12 DIAGNOSIS — E1165 Type 2 diabetes mellitus with hyperglycemia: Secondary | ICD-10-CM

## 2023-12-12 DIAGNOSIS — R197 Diarrhea, unspecified: Secondary | ICD-10-CM

## 2023-12-12 LAB — BASIC METABOLIC PANEL WITH GFR
Anion gap: 8 (ref 5–15)
BUN: 32 mg/dL — ABNORMAL HIGH (ref 6–20)
CO2: 21 mmol/L — ABNORMAL LOW (ref 22–32)
Calcium: 10 mg/dL (ref 8.9–10.3)
Chloride: 110 mmol/L (ref 98–111)
Creatinine, Ser: 1.25 mg/dL — ABNORMAL HIGH (ref 0.61–1.24)
GFR, Estimated: >60 mL/min (ref >60)
Glucose, Bld: 136 mg/dL — ABNORMAL HIGH (ref 70–99)
Potassium: 3.7 mmol/L (ref 3.5–5.1)
Sodium: 139 mmol/L (ref 135–145)

## 2023-12-12 LAB — CBC
HCT: 39.4 % (ref 39.0–52.0)
Hemoglobin: 13.3 g/dL (ref 13.0–17.0)
MCH: 29.9 pg (ref 26.0–34.0)
MCHC: 33.8 g/dL (ref 30.0–36.0)
MCV: 88.5 fL (ref 80.0–100.0)
Platelets: 325 K/uL (ref 150–400)
RBC: 4.45 MIL/uL (ref 4.22–5.81)
RDW: 13.6 % (ref 11.5–15.5)
WBC: 8 K/uL (ref 4.0–10.5)
nRBC: 0 % (ref 0.0–0.2)

## 2023-12-12 LAB — GLUCOSE, CAPILLARY: Glucose-Capillary: 162 mg/dL — ABNORMAL HIGH (ref 70–99)

## 2023-12-12 LAB — PHOSPHORUS: Phosphorus: 2.7 mg/dL (ref 2.5–4.6)

## 2023-12-12 NOTE — Discharge Summary (Signed)
 Physician Discharge Summary   Patient: Chase Hull MRN: [id number] DOB: 1965/10/27  Admit date:     12/10/2023  Discharge date: 12/12/23  Discharge Physician: Chase Hull   PCP: Chase Lauraine SAILOR, DO   Recommendations at discharge:  Please obtain CBC and BMP and follow-up Please resume losartan once renal function normalized and blood pressure started trending up Please follow-up on stool studies for ova and parasite Follow-up with primary care provider within a week  Discharge Diagnoses: Principal Problem:   AKI (acute kidney injury) Active Problems:   Type 2 diabetes mellitus with hyperglycemia (HCC)   Diarrhea   Elevated lipase   Hyponatremia   E. coli enteritis   Hospital Course: Chase Hull is a 58 y.o. male with medical history significant for remote history of bleeding peptic ulcer disease in his 29s,  hypertension, hyperlipidemia, type II DM, lumbar spinal stenosis, degenerative disc disease lumbar spine, chronic pain syndrome, gout, scented to the hospital with 1 week history of diarrhea.  Had some chills but no fever.  On presentation he was hemodynamically stable but found to have AKI with creatinine at 2.33, lipase elevated 387.  Likely secondary to significant dehydration.  CT abdomen and pelvis was negative for any acute abnormalities.  Stool studies were negative for C. difficile but came back positive for E. coli.  Stool for ova and parasite was also sent with pending results.  Patient was given IV fluid with improvement in renal function.  Diarrhea resolved and patient was tolerating diet. His creatinine on the day of discharge was 1.25 with baseline around 1.  He will continue holding losartan, will resume his home amlodipine  as blood pressure now started trending up.  He need a close follow-up with primary care provider and they can resume losartan when appropriate.  Patient will continue the rest of his home medications and follow-up with his primary care  provider for further assistance.   Consultants: None Procedures performed: None Disposition: Home Diet recommendation:  Discharge Diet Orders (From admission, onward)     Start     Ordered   12/12/23 0000  Diet - low sodium heart healthy        12/12/23 1055           Cardiac and Carb modified diet DISCHARGE MEDICATION: Allergies as of 12/12/2023       Reactions   Gabapentin  Anaphylaxis        Medication List     PAUSE taking these medications    lisinopril  40 MG tablet Wait to take this until your doctor or other care provider tells you to start again. Commonly known as: ZESTRIL  Take 1 tablet (40 mg total) by mouth daily.       STOP taking these medications    allopurinol  100 MG tablet Commonly known as: ZYLOPRIM        TAKE these medications    Accu-Chek Guide Me w/Device Kit 1 kit by Does not apply route daily.   Accu-Chek Guide Test test strip Generic drug: glucose blood Use to check blood glucose daily Dx E11.65   amLODipine  10 MG tablet Commonly known as: NORVASC  Take 1 tablet (10 mg total) by mouth daily.   Azelastine  HCl 137 MCG/SPRAY Soln PLACE 1-2 SPRAYS INTO THE NOSE DAILY.   buPROPion  150 MG 24 hr tablet Commonly known as: WELLBUTRIN  XL Take 2 tablets (300 mg total) by mouth daily. TAKE 1 TABLET BY MOUTH ONCE DAILY X 3 DAYS THEN TAKE 2 TABLETS DAILY  carvedilol  3.125 MG tablet Commonly known as: COREG  Take 1 tablet (3.125 mg total) by mouth 2 (two) times daily with a meal.   ciclopirox  8 % solution Commonly known as: PENLAC  Apply topically at bedtime. Apply over nail and surrounding skin. Apply daily over previous coat. After seven (7) days, may remove with alcohol and continue cycle.   fenofibrate  145 MG tablet Commonly known as: Tricor  Take 1 tablet (145 mg total) by mouth daily.   glipiZIDE  10 MG tablet Commonly known as: GLUCOTROL  TAKE 1 TABLET (10 MG TOTAL) BY MOUTH 2 (TWO) TIMES DAILY BEFORE A MEAL. FREQUENCY CHANGE  FROM ONCE DAILY TO TWICE DAILY   levocetirizine 5 MG tablet Commonly known as: XYZAL  Take 1 tablet (5 mg total) by mouth every evening.   metFORMIN  500 MG 24 hr tablet Commonly known as: GLUCOPHAGE -XR Take 2 tablets (1,000 mg total) by mouth 2 (two) times daily with a meal. Take 2 tablets (1,000 mg total) by mouth 2 (two) times daily with a meal.   methocarbamol  500 MG tablet Commonly known as: ROBAXIN  TAKE 1 TABLET BY MOUTH EVERY 8 (EIGHT) HOURS AS NEEDED FOR MUSCLE SPASMS. THIS CAN MAKE YOU SLEEPY.   naltrexone  50 MG tablet Commonly known as: DEPADE Take 0.5-1 tablets (25-50 mg total) by mouth daily.   omeprazole  40 MG capsule Commonly known as: PRILOSEC Take 1 capsule (40 mg total) by mouth daily.   OneTouch Delica Lancets 33G Misc 1 each by Does not apply route daily.   rosuvastatin  40 MG tablet Commonly known as: CRESTOR  Take 1 tablet (40 mg total) by mouth daily.   tadalafil  20 MG tablet Commonly known as: CIALIS  Take 1 tablet (20 mg total) by mouth daily as needed for erectile dysfunction.        Follow-up Information     Chase Lauraine SAILOR, DO. Go on 12/26/2023.   Specialty: Family Medicine Why: Go at 8:40am. Please bring ID, insurance cardand copay if you have one. Contact information: [address] [place] [address] [place] [address] [phone]                Discharge Exam: Chase Hull   12/10/23 1228  Weight: 103.9 kg   General.  Well-developed gentleman, in no acute distress. Pulmonary.  Lungs clear bilaterally, normal respiratory effort. CV.  Regular rate and rhythm, no JVD, rub or murmur. Abdomen.  Soft, nontender, nondistended, BS positive. CNS.  Alert and oriented .  No focal neurologic deficit. Extremities.  No edema, no cyanosis, pulses intact and symmetrical. Psychiatry.  Judgment and insight appears normal.   Condition at discharge: stable  The results of significant diagnostics from this hospitalization (including imaging,  microbiology, ancillary and laboratory) are listed below for reference.   Imaging Studies: CT ABDOMEN PELVIS WO CONTRAST Result Date: 12/10/2023 EXAM: CT ABDOMEN AND PELVIS WITHOUT CONTRAST 12/10/2023 02:35:52 PM TECHNIQUE: CT of the abdomen and pelvis was performed without the administration of intravenous contrast. Multiplanar reformatted images are provided for review. Automated exposure control, iterative reconstruction, and/or weight-based adjustment of the mA/kV was utilized to reduce the radiation dose to as low as reasonably achievable. COMPARISON: None available. CLINICAL HISTORY: periumbilical pain. Abdominal Pain diarrhea x 1 week. FINDINGS: LOWER CHEST: No acute abnormality. LIVER: Hepatic steatosis. GALLBLADDER AND BILE DUCTS: Gallbladder is unremarkable. No biliary ductal dilatation. SPLEEN: No acute abnormality. PANCREAS: No acute abnormality. ADRENAL GLANDS: No acute abnormality. KIDNEYS, URETERS AND BLADDER: No stones in the kidneys or ureters. No hydronephrosis. No perinephric or periureteral stranding. Urinary  bladder is unremarkable. GI AND BOWEL: Stomach demonstrates no acute abnormality. There is no bowel obstruction. Normal appendix. PERITONEUM AND RETROPERITONEUM: No ascites. No free air. VASCULATURE: Aorta is normal in caliber. Aortic atherosclerotic calcification. LYMPH NODES: No lymphadenopathy. REPRODUCTIVE ORGANS: No acute abnormality. BONES AND SOFT TISSUES: No acute osseous abnormality. No focal soft tissue abnormality. IMPRESSION: 1. No acute findings in the abdomen or pelvis; normal appendix. 2. Hepatic steatosis. 3. Aortic atherosclerotic calcification. Electronically signed by: Norman Gatlin MD 12/10/2023 02:58 PM EDT RP Workstation: [id number]    Microbiology: Results for orders placed or performed during the hospital encounter of 12/10/23  Gastrointestinal Panel by PCR , Stool     Status: Abnormal   Collection Time: 12/10/23  8:27 PM   Specimen: Stool  Result Value  Ref Range Status   Campylobacter species NOT DETECTED NOT DETECTED Final   Plesimonas shigelloides NOT DETECTED NOT DETECTED Final   Salmonella species NOT DETECTED NOT DETECTED Final   Yersinia enterocolitica NOT DETECTED NOT DETECTED Final   Vibrio species NOT DETECTED NOT DETECTED Final   Vibrio cholerae NOT DETECTED NOT DETECTED Final   Enteroaggregative E coli (EAEC) NOT DETECTED NOT DETECTED Final   Enteropathogenic E coli (EPEC) NOT DETECTED NOT DETECTED Final   Enterotoxigenic E coli (ETEC) DETECTED (A) NOT DETECTED Final    Comment: RESULT CALLED TO, READ BACK BY AND VERIFIED WITH: KENDALL OVERTON RN @2251  12/10/23 ASW    Shiga like toxin producing E coli (STEC) NOT DETECTED NOT DETECTED Final   Shigella/Enteroinvasive E coli (EIEC) NOT DETECTED NOT DETECTED Final   Cryptosporidium NOT DETECTED NOT DETECTED Final   Cyclospora cayetanensis NOT DETECTED NOT DETECTED Final   Entamoeba histolytica NOT DETECTED NOT DETECTED Final   Giardia lamblia NOT DETECTED NOT DETECTED Final   Adenovirus F40/41 NOT DETECTED NOT DETECTED Final   Astrovirus NOT DETECTED NOT DETECTED Final   Norovirus GI/GII NOT DETECTED NOT DETECTED Final   Rotavirus A NOT DETECTED NOT DETECTED Final   Sapovirus (I, II, IV, and V) NOT DETECTED NOT DETECTED Final    Comment: Performed at [facility], [address] [place], [place] [address]    Labs: CBC: Recent Labs  Lab 12/10/23 1224 12/11/23 0550 12/12/23 0358  WBC 9.3 8.0 8.0  HGB 14.4 12.8* 13.3  HCT 43.7 37.7* 39.4  MCV 91.0 89.8 88.5  PLT 327 294 325   Basic Metabolic Panel: Recent Labs  Lab 12/10/23 1224 12/11/23 0550 12/12/23 0358  NA 133* 138 139  K 4.1 4.1 3.7  CL 109 111 110  CO2 14* 15* 21*  GLUCOSE 244* 149* 136*  BUN 63* 38* 32*  CREATININE 2.33* 1.62* 1.25*  CALCIUM  10.5* 9.8 10.0  MG  --  2.0  --   PHOS  --  2.6 2.7   Liver Function Tests: Recent Labs  Lab 12/10/23 1224 12/11/23 0550  AST 17  --   ALT 21   --   ALKPHOS 80  --   BILITOT 0.6  --   PROT 7.8  --   ALBUMIN 4.3 3.5   CBG: Recent Labs  Lab 12/11/23 0807 12/11/23 1143 12/11/23 1732 12/11/23 2146 12/12/23 0746  GLUCAP 200* 155* 109* 214* 162*    Discharge time spent: greater than 30 minutes.  This record has been created using Conservation officer, historic buildings. Errors have been sought and corrected,but may not always be located. Such creation errors do not reflect on the standard of care.   Signed: Amaryllis Dare, MD  [facility] 12/12/2023

## 2023-12-12 NOTE — Plan of Care (Signed)

## 2023-12-12 NOTE — Plan of Care (Signed)
  Problem: Education: Goal: Knowledge of General Education information will improve Description: Including pain rating scale, medication(s)/side effects and non-pharmacologic comfort measures Outcome: Progressing   Problem: Health Behavior/Discharge Planning: Goal: Ability to manage health-related needs will improve Outcome: Progressing   Problem: Clinical Measurements: Goal: Ability to maintain clinical measurements within normal limits will improve Outcome: Progressing Goal: Will remain free from infection Outcome: Progressing Goal: Diagnostic test results will improve Outcome: Progressing Goal: Respiratory complications will improve Outcome: Progressing Goal: Cardiovascular complication will be avoided Outcome: Progressing   Problem: Activity: Goal: Risk for activity intolerance will decrease Outcome: Progressing   Problem: Nutrition: Goal: Adequate nutrition will be maintained Outcome: Progressing   Problem: Coping: Goal: Level of anxiety will decrease Outcome: Progressing   Problem: Elimination: Goal: Will not experience complications related to bowel motility Outcome: Progressing Goal: Will not experience complications related to urinary retention Outcome: Progressing   Problem: Pain Managment: Goal: General experience of comfort will improve and/or be controlled Outcome: Progressing   Problem: Skin Integrity: Goal: Risk for impaired skin integrity will decrease Outcome: Progressing   Problem: Education: Goal: Ability to describe self-care measures that may prevent or decrease complications (Diabetes Survival Skills Education) will improve Outcome: Progressing Goal: Individualized Educational Video(s) Outcome: Progressing   Problem: Coping: Goal: Ability to adjust to condition or change in health will improve Outcome: Progressing   Problem: Fluid Volume: Goal: Ability to maintain a balanced intake and output will improve Outcome: Progressing    Problem: Health Behavior/Discharge Planning: Goal: Ability to identify and utilize available resources and services will improve Outcome: Progressing Goal: Ability to manage health-related needs will improve Outcome: Progressing   Problem: Nutritional: Goal: Maintenance of adequate nutrition will improve Outcome: Progressing Goal: Progress toward achieving an optimal weight will improve Outcome: Progressing   Problem: Skin Integrity: Goal: Risk for impaired skin integrity will decrease Outcome: Progressing   Problem: Tissue Perfusion: Goal: Adequacy of tissue perfusion will improve Outcome: Progressing

## 2023-12-13 ENCOUNTER — Telehealth: Payer: Self-pay

## 2023-12-13 NOTE — Transitions of Care (Post Inpatient/ED Visit) (Signed)
 12/13/2023  Name: Chase Hull MRN: [id number] DOB: May 15, 1965  Today's TOC FU Call Status: Today's TOC FU Call Status:: Successful TOC FU Call Completed TOC FU Call Complete Date: 12/13/23 Patient's Name and Date of Birth confirmed.  Transition Care Management Follow-up Telephone Call Date of Discharge: 12/12/23 Discharge Facility: [facility]) How have you been since you were released from the hospital?: Better Any questions or concerns?: No  Items Reviewed: Did you receive and understand the discharge instructions provided?: Yes Medications obtained,verified, and reconciled?: Yes (Medications Reviewed) Any new allergies since your discharge?: No Dietary orders reviewed?: Yes Type of Diet Ordered:: low sodium heart healthy Do you have support at home?: Yes People in Home [RPT]: alone Name of Support/Comfort Primary Source: mother - [name] is my contact  Medications Reviewed Today: Medications Reviewed Today     Reviewed by Eilleen Richerd GRADE, RN (Registered Nurse) on 12/13/23 at 1228  Med List Status: <None>   Medication Order Taking? Sig Documenting Provider Last Dose Status Informant  amLODipine  (NORVASC ) 10 MG tablet [id number]  Take 1 tablet (10 mg total) by mouth daily. Donzella Lauraine SAILOR, DO  Active Self  Azelastine  HCl 137 MCG/SPRAY SOLN [id number]  PLACE 1-2 SPRAYS INTO THE NOSE DAILY. Donzella Lauraine SAILOR, DO  Active Self  Blood Glucose Monitoring Suppl (ACCU-CHEK GUIDE ME) w/Device KIT [id number]  1 kit by Does not apply route daily. [provider]  Active Self  buPROPion  (WELLBUTRIN  XL) 150 MG 24 hr tablet [id number]  Take 2 tablets (300 mg total) by mouth daily. TAKE 1 TABLET BY MOUTH ONCE DAILY X 3 DAYS THEN TAKE 2 TABLETS DAILY  Patient not taking: Reported on 12/10/2023   Pardue, Sarah N, DO  Active Self  carvedilol  (COREG ) 3.125 MG tablet [id number]  Take 1 tablet (3.125 mg total) by mouth 2 (two) times daily with a meal.  Patient  not taking: Reported on 12/10/2023   Donzella Lauraine SAILOR, DO  Active Self  ciclopirox  (PENLAC ) 8 % solution [id number]  Apply topically at bedtime. Apply over nail and surrounding skin. Apply daily over previous coat. After seven (7) days, may remove with alcohol and continue cycle. Donzella Lauraine SAILOR, DO  Active Self           Med Note ZENA NATHANAEL CROME   Tue Dec 10, 2023  4:19 PM) Hasn't started yet  fenofibrate  (TRICOR ) 145 MG tablet [id number]  Take 1 tablet (145 mg total) by mouth daily. Emilio Marseille T, FNP  Active Self  glipiZIDE  (GLUCOTROL ) 10 MG tablet [id number]  TAKE 1 TABLET (10 MG TOTAL) BY MOUTH 2 (TWO) TIMES DAILY BEFORE A MEAL. FREQUENCY CHANGE FROM ONCE DAILY TO TWICE DAILY Pardue, Lauraine SAILOR, DO  Active Self           Med Note ZENA NATHANAEL CROME   Tue Dec 10, 2023  4:19 PM) Hasn't taken in a while due to dropping blood sugar  glucose blood (ACCU-CHEK GUIDE TEST) test strip [id number]  Use to check blood glucose daily Dx E11.65 Emilio Marseille T, FNP  Active Self  levocetirizine (XYZAL ) 5 MG tablet [id number]  Take 1 tablet (5 mg total) by mouth every evening. Donzella Lauraine SAILOR, DO  Active Self  lisinopril  (ZESTRIL ) 40 MG tablet [id number]  Take 1 tablet (40 mg total) by mouth daily. Donzella Lauraine SAILOR, DO  Active Self  metFORMIN  (GLUCOPHAGE -XR) 500 MG 24 hr tablet [id number]  Take 2 tablets (1,000 mg total) by mouth 2 (two) times daily  with a meal. Take 2 tablets (1,000 mg total) by mouth 2 (two) times daily with a meal. Pardue, Lauraine SAILOR, DO  Active Self  methocarbamol  (ROBAXIN ) 500 MG tablet [id number]  TAKE 1 TABLET BY MOUTH EVERY 8 (EIGHT) HOURS AS NEEDED FOR MUSCLE SPASMS. THIS CAN MAKE YOU SLEEPY. Ulis Bottcher, PA-C  Active Self  naltrexone  (DEPADE) 50 MG tablet [id number]  Take 0.5-1 tablets (25-50 mg total) by mouth daily.  Patient not taking: Reported on 12/10/2023   Donzella Lauraine SAILOR, DO  Active Self  omeprazole  (PRILOSEC) 40 MG capsule [id number]  Take 1 capsule (40 mg total) by mouth daily. Donzella Lauraine SAILOR, DO  Active Self  OneTouch Delica Lancets 33G MISC [id number]  1 each by Does not apply route daily. Johnson, Megan P, DO  Active Self  rosuvastatin  (CRESTOR ) 40 MG tablet [id number]  Take 1 tablet (40 mg total) by mouth daily. Emilio Kelly DASEN, FNP  Active Self  tadalafil  (CIALIS ) 20 MG tablet [id number]  Take 1 tablet (20 mg total) by mouth daily as needed for erectile dysfunction. Donzella Lauraine SAILOR, DO  Active Self            Home Care and Equipment/Supplies: Were Home Health Services Ordered?: No Any new equipment or medical supplies ordered?: No  Functional Questionnaire: Do you need assistance with bathing/showering or dressing?: No Do you need assistance with meal preparation?: No Do you need assistance with eating?: No Do you have difficulty maintaining continence: No Do you need assistance with getting out of bed/getting out of a chair/moving?: No Do you have difficulty managing or taking your medications?: No  Follow up appointments reviewed: PCP Follow-up appointment confirmed?: Yes Date of PCP follow-up appointment?: 12/26/23 Follow-up Provider: Lauraine Donzella, DO Specialist Hospital Follow-up appointment confirmed?: NA Do you need transportation to your follow-up appointment?: No Do you understand care options if your condition(s) worsen?: Yes-patient verbalized understanding  SDOH Interventions Today    Flowsheet Row Most Recent Value  SDOH Interventions   Food Insecurity Interventions Intervention Not Indicated  Housing Interventions Intervention Not Indicated  Transportation Interventions Intervention Not Indicated  Utilities Interventions Intervention Not Indicated   Discussed and offered 30 day TOC program.  Patient currently declines stating he could not participate as he's returning to work. The patient has been provided with contact information for the care management team and has been advised to call with any health -related questions or concerns.  The  patient verbalized understanding with current plan of care.    Richerd Fish, RN, BSN, CCM [facility] RN Care Manager Direct Dial: [phone]

## 2023-12-13 NOTE — Transitions of Care (Post Inpatient/ED Visit) (Signed)
   12/13/2023  Name: Chase Hull MRN: [id number] DOB: Feb 15, 1966  Today's TOC FU Call Status: Today's TOC FU Call Status:: Unsuccessful Call (1st Attempt) Unsuccessful Call (1st Attempt) Date: 12/13/23  Attempted to reach the patient regarding the most recent Inpatient/ED visit. Left a HIPAA approved voicemail message to phone number provided in demographics per DPR.    Follow Up Plan: Additional outreach attempts will be made to reach the patient to complete the Transitions of Care (Post Inpatient/ED visit) call.   Richerd Fish, RN, BSN, CCM [facility], [facility] RN Care Manager Direct Dial: [phone]

## 2023-12-15 ENCOUNTER — Other Ambulatory Visit: Payer: Self-pay | Admitting: Family Medicine

## 2023-12-15 DIAGNOSIS — T485X5A Adverse effect of other anti-common-cold drugs, initial encounter: Secondary | ICD-10-CM

## 2023-12-15 DIAGNOSIS — J302 Other seasonal allergic rhinitis: Secondary | ICD-10-CM

## 2023-12-16 LAB — O&P RESULT

## 2023-12-16 LAB — OVA + PARASITE EXAM

## 2023-12-20 ENCOUNTER — Ambulatory Visit: Payer: Self-pay | Admitting: Family Medicine

## 2023-12-26 ENCOUNTER — Ambulatory Visit: Admitting: Family Medicine

## 2023-12-26 ENCOUNTER — Encounter: Payer: Self-pay | Admitting: Family Medicine

## 2023-12-26 VITALS — BP 138/95 | HR 91 | Resp 14 | Ht 72.0 in | Wt 235.9 lb

## 2023-12-26 DIAGNOSIS — M1A0690 Idiopathic chronic gout, unspecified knee, without tophus (tophi): Secondary | ICD-10-CM

## 2023-12-26 DIAGNOSIS — F17200 Nicotine dependence, unspecified, uncomplicated: Secondary | ICD-10-CM

## 2023-12-26 DIAGNOSIS — Z716 Tobacco abuse counseling: Secondary | ICD-10-CM

## 2023-12-26 DIAGNOSIS — E1165 Type 2 diabetes mellitus with hyperglycemia: Secondary | ICD-10-CM | POA: Diagnosis not present

## 2023-12-26 DIAGNOSIS — I152 Hypertension secondary to endocrine disorders: Secondary | ICD-10-CM

## 2023-12-26 DIAGNOSIS — A09 Infectious gastroenteritis and colitis, unspecified: Secondary | ICD-10-CM | POA: Diagnosis not present

## 2023-12-26 DIAGNOSIS — Z09 Encounter for follow-up examination after completed treatment for conditions other than malignant neoplasm: Secondary | ICD-10-CM | POA: Diagnosis not present

## 2023-12-26 DIAGNOSIS — E1159 Type 2 diabetes mellitus with other circulatory complications: Secondary | ICD-10-CM

## 2023-12-26 DIAGNOSIS — Z7984 Long term (current) use of oral hypoglycemic drugs: Secondary | ICD-10-CM

## 2023-12-26 MED ORDER — NICOTINE 21-14-7 MG/24HR TD KIT
1.0000 | PACK | TRANSDERMAL | 0 refills | Status: AC
Start: 2023-12-26 — End: ?

## 2023-12-26 MED ORDER — ALLOPURINOL 100 MG PO TABS
200.0000 mg | ORAL_TABLET | Freq: Every day | ORAL | 2 refills | Status: AC
Start: 2023-12-26 — End: ?

## 2023-12-26 MED ORDER — LISINOPRIL 20 MG PO TABS: 20.0000 mg | ORAL_TABLET | Freq: Every day | ORAL | 0 refills | Status: AC

## 2023-12-26 MED ORDER — NICOTINE POLACRILEX 2 MG MT LOZG
2.0000 mg | LOZENGE | OROMUCOSAL | 0 refills | Status: AC | PRN
Start: 2023-12-26 — End: ?

## 2023-12-26 NOTE — Patient Instructions (Signed)
 Take lisinopril  20 mg daily for 1-2 weeks and monitor blood pressure. If your blood pressure remains >130/80 after two weeks, stop the lisinopril  20 mg pills and resume your lisinopril  40 mg pills.

## 2023-12-26 NOTE — Progress Notes (Signed)
 Established patient visit   Patient: Chase Hull   DOB: May 30, 1965   58 y.o. Male  MRN: [id number] Visit Date: 12/26/2023  Today's healthcare provider: LAURAINE LOISE BUOY, DO   Chief Complaint  Patient presents with   Hospitalization Follow-up    Reports doing better since ER visit.  No more diarrhea, reports back to normal stool. F/u stool studies (pt is fasting)   Subjective    HPI Chase Hull is a 58 year old male with hypertension and gout who presents for follow-up after hospitalization for E. coli infection.  He recently experienced an E. coli infection which led to an acute kidney injury.  I believe the infection was caused by eating at a restaurant. During the infection, he had stomach pain and frequent diarrhea. These symptoms have since resolved, and he reports normal bowel movements and urination. He has been hydrating well with fluids like Propel and notes a slight weight loss since the hospitalization.  He has a history of hypertension and is currently taking amlodipine . He had previously been on lisinopril , which he took at night, but had stopped. He monitors his blood pressure at home.  He has a history of gout affecting his right knee with swelling and stiffness. He previously took allopurinol  but stopped after running out. He describes the gout as 'knocking at the door' and affecting various joints, including his knee, ankle, foot, and shoulders.  He has reduced his smoking to about five cigarettes a day since his hospitalization. He used nicotine patches in the hospital and found them effective.  He recently received a flu shot and has a history of hepatitis B vaccination through his job, although he does not have records of the vaccination but believes he completed the series. He does not receive COVID-19 vaccinations.      Medications: Outpatient Medications Prior to Visit  Medication Sig   amLODipine  (NORVASC ) 10 MG tablet Take 1 tablet (10 mg total)  by mouth daily.   Azelastine  HCl 137 MCG/SPRAY SOLN PLACE 1-2 SPRAYS INTO THE NOSE DAILY.   Blood Glucose Monitoring Suppl (ACCU-CHEK GUIDE ME) w/Device KIT 1 kit by Does not apply route daily.   buPROPion  (WELLBUTRIN  XL) 150 MG 24 hr tablet Take 2 tablets (300 mg total) by mouth daily. TAKE 1 TABLET BY MOUTH ONCE DAILY X 3 DAYS THEN TAKE 2 TABLETS DAILY   carvedilol  (COREG ) 3.125 MG tablet Take 1 tablet (3.125 mg total) by mouth 2 (two) times daily with a meal.   ciclopirox  (PENLAC ) 8 % solution Apply topically at bedtime. Apply over nail and surrounding skin. Apply daily over previous coat. After seven (7) days, may remove with alcohol and continue cycle.   fenofibrate  (TRICOR ) 145 MG tablet Take 1 tablet (145 mg total) by mouth daily.   glipiZIDE  (GLUCOTROL ) 10 MG tablet TAKE 1 TABLET (10 MG TOTAL) BY MOUTH 2 (TWO) TIMES DAILY BEFORE A MEAL. FREQUENCY CHANGE FROM ONCE DAILY TO TWICE DAILY   glucose blood (ACCU-CHEK GUIDE TEST) test strip Use to check blood glucose daily Dx E11.65   levocetirizine (XYZAL ) 5 MG tablet Take 1 tablet (5 mg total) by mouth every evening.   lisinopril  (ZESTRIL ) 40 MG tablet Take 1 tablet (40 mg total) by mouth daily.   metFORMIN  (GLUCOPHAGE -XR) 500 MG 24 hr tablet Take 2 tablets (1,000 mg total) by mouth 2 (two) times daily with a meal. Take 2 tablets (1,000 mg total) by mouth 2 (two) times daily with a meal.  methocarbamol  (ROBAXIN ) 500 MG tablet TAKE 1 TABLET BY MOUTH EVERY 8 (EIGHT) HOURS AS NEEDED FOR MUSCLE SPASMS. THIS CAN MAKE YOU SLEEPY.   naltrexone  (DEPADE) 50 MG tablet Take 0.5-1 tablets (25-50 mg total) by mouth daily.   omeprazole  (PRILOSEC) 40 MG capsule Take 1 capsule (40 mg total) by mouth daily.   OneTouch Delica Lancets 33G MISC 1 each by Does not apply route daily.   rosuvastatin  (CRESTOR ) 40 MG tablet Take 1 tablet (40 mg total) by mouth daily.   tadalafil  (CIALIS ) 20 MG tablet Take 1 tablet (20 mg total) by mouth daily as needed for erectile  dysfunction.   No facility-administered medications prior to visit.        Objective    BP (!) 138/95   Pulse 91   Resp 14   Ht 6' (1.829 m)   Wt 235 lb 14.4 oz (107 kg)   SpO2 99%   BMI 31.99 kg/m     Physical Exam Vitals and nursing note reviewed.  Constitutional:      General: He is not in acute distress.    Appearance: Normal appearance.  HENT:     Head: Normocephalic and atraumatic.  Eyes:     General: No scleral icterus.    Conjunctiva/sclera: Conjunctivae normal.  Cardiovascular:     Rate and Rhythm: Normal rate.  Pulmonary:     Effort: Pulmonary effort is normal.  Neurological:     Mental Status: He is alert and oriented to person, place, and time. Mental status is at baseline.  Psychiatric:        Mood and Affect: Mood normal.        Behavior: Behavior normal.      Results for orders placed or performed in visit on 12/26/23  CBC with Differential/Platelet  Result Value Ref Range   WBC 7.0 3.4 - 10.8 x10E3/uL   RBC 4.40 4.14 - 5.80 x10E6/uL   Hemoglobin 13.4 13.0 - 17.7 g/dL   Hematocrit 59.5 62.4 - 51.0 %   MCV 92 79 - 97 fL   MCH 30.5 26.6 - 33.0 pg   MCHC 33.2 31.5 - 35.7 g/dL   RDW 86.2 88.3 - 84.5 %   Platelets 284 150 - 450 x10E3/uL   Neutrophils 73 Not Estab. %   Lymphs 18 Not Estab. %   Monocytes 7 Not Estab. %   Eos 1 Not Estab. %   Basos 1 Not Estab. %   Neutrophils Absolute 5.1 1.4 - 7.0 x10E3/uL   Lymphocytes Absolute 1.3 0.7 - 3.1 x10E3/uL   Monocytes Absolute 0.5 0.1 - 0.9 x10E3/uL   EOS (ABSOLUTE) 0.1 0.0 - 0.4 x10E3/uL   Basophils Absolute 0.0 0.0 - 0.2 x10E3/uL   Immature Granulocytes 0 Not Estab. %   Immature Grans (Abs) 0.0 0.0 - 0.1 x10E3/uL  Basic Metabolic Panel (BMET)  Result Value Ref Range   Glucose 86 70 - 99 mg/dL   BUN 15 6 - 24 mg/dL   Creatinine, Ser 8.88 0.76 - 1.27 mg/dL   eGFR 78 >40 fO/fpw/8.26   BUN/Creatinine Ratio 14 9 - 20   Sodium 143 134 - 144 mmol/L   Potassium 3.8 3.5 - 5.2 mmol/L   Chloride  105 96 - 106 mmol/L   CO2 20 20 - 29 mmol/L   Calcium  9.9 8.7 - 10.2 mg/dL  Hemoglobin J8r  Result Value Ref Range   Hgb A1c MFr Bld 6.5 (H) 4.8 - 5.6 %   Est. average glucose Bld gHb  Est-mCnc 140 mg/dL  Uric acid  Result Value Ref Range   Uric Acid 4.2 3.8 - 8.4 mg/dL    Assessment & Plan    Hospital discharge follow-up -     CBC with Differential/Platelet -     Basic metabolic panel with GFR  Diarrhea of infectious origin -     CBC with Differential/Platelet -     Basic metabolic panel with GFR  Type 2 diabetes mellitus with hyperglycemia, without long-term current use of insulin (HCC) -     Basic metabolic panel with GFR -     Hemoglobin A1c  Hypertension associated with diabetes (HCC) -     Lisinopril ; Take 1 tablet (20 mg total) by mouth daily. Take for 1-2 weeks (up to 4 weeks) prior to resuming 40 mg pills.  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  Dispense: 180 tablet; Refill: 2 -     Uric acid  Nicotine dependence with current use -     Nicotine; Place 1 kit onto the skin as directed. Use as directed  Dispense: 1 kit; Refill: 0 -     Nicotine Polacrilex; Take 1 lozenge (2 mg total) by mouth as needed for smoking cessation.  Dispense: 100 tablet; Refill: 0  Encounter for smoking cessation counseling -     Nicotine; Place 1 kit onto the skin as directed. Use as directed  Dispense: 1 kit; Refill: 0 -     Nicotine Polacrilex; Take 1 lozenge (2 mg total) by mouth as needed for smoking cessation.  Dispense: 100 tablet; Refill: 0     Hospital discharge follow-up; diarrhea of infectious origin; type 2 diabetes mellitus with hyperglycemia, without long-term current use of insulin Will recheck BMP and CBC today.  Will also check patient's hemoglobin A1c.  Continue metformin  XR 1000 mg twice daily.  Hypertension associated with  diabetes Hypertension managed with amlodipine  10 mg daily and carvedilol  3.125 mg tablets twice daily. Lisinopril  previously held due to acute kidney injury while hospitalized. Current BP elevated at 138/95 mmHg. - Restart lisinopril  at 20 mg daily to facilitate better control of blood pressure. - Monitor home blood pressure for hypotension and dizziness.  If blood pressure remains elevated >130/80 and patient does not develop symptoms consistent with hypotension, he will then be transitioning back to his lisinopril  40 mg daily. - Order blood work to recheck kidney function.  Idiopathic chronic gout of knee, without tophus, unspecified laterality Chronic gout with recent right knee flare. Previously on allopurinol , discontinued during hospitalization.  Kidney function almost back to normal prior to discharge.  Will recheck level today. - Monitor kidney function with blood work. - Okay to restart allopurinol  at 200 mg daily.  If kidney function has worsened, patient understands and would need to stop the allopurinol . - Consider tapering allopurinol  to 100 mg if uric acid levels controlled and patient does not develop flares.  Nicotine dependence with current use; encounter smoking cessation counseling Nicotine dependence managed with patches. Smoking reduced to five cigarettes daily. Previously used 21 mg patches successfully. - Prescribe 21 mg nicotine patches. - Step down to 14 mg patches after one month. - Advise against smoking while using the patch. - Prescribe nicotine lozenges as an alternative. - Send prescriptions to [facility].  General Health Maintenance Recent flu vaccination. Hepatitis B vaccination status uncertain. No COVID-19 vaccination. Smoking cessation  ongoing. - Verify hepatitis B vaccination status with employer. - Continue smoking cessation with nicotine replacement therapy.    Return in about 3 months (around 03/27/2024) for DM, Chronic f/u.      I discussed the  assessment and treatment plan with the patient  The patient was provided an opportunity to ask questions and all were answered. The patient agreed with the plan and demonstrated an understanding of the instructions.   The patient was advised to call back or seek an in-person evaluation if the symptoms worsen or if the condition fails to improve as anticipated.    LAURAINE LOISE BUOY, DO  [facility] [phone] (phone) [phone] (fax)  [facility]

## 2023-12-27 LAB — BASIC METABOLIC PANEL WITH GFR
BUN/Creatinine Ratio: 14 (ref 9–20)
BUN: 15 mg/dL (ref 6–24)
CO2: 20 mmol/L (ref 20–29)
Calcium: 9.9 mg/dL (ref 8.7–10.2)
Chloride: 105 mmol/L (ref 96–106)
Creatinine, Ser: 1.11 mg/dL (ref 0.76–1.27)
Glucose: 86 mg/dL (ref 70–99)
Potassium: 3.8 mmol/L (ref 3.5–5.2)
Sodium: 143 mmol/L (ref 134–144)
eGFR: 78 mL/min/1.73 (ref >59)

## 2023-12-27 LAB — CBC WITH DIFFERENTIAL/PLATELET
Basophils Absolute: 0 10*3/uL (ref 0.0–0.2)
Basos: 1 %
EOS (ABSOLUTE): 0.1 10*3/uL (ref 0.0–0.4)
Eos: 1 %
Hematocrit: 40.4 % (ref 37.5–51.0)
Hemoglobin: 13.4 g/dL (ref 13.0–17.7)
Immature Grans (Abs): 0 10*3/uL (ref 0.0–0.1)
Immature Granulocytes: 0 %
Lymphocytes Absolute: 1.3 10*3/uL (ref 0.7–3.1)
Lymphs: 18 %
MCH: 30.5 pg (ref 26.6–33.0)
MCHC: 33.2 g/dL (ref 31.5–35.7)
MCV: 92 fL (ref 79–97)
Monocytes Absolute: 0.5 10*3/uL (ref 0.1–0.9)
Monocytes: 7 %
Neutrophils Absolute: 5.1 10*3/uL (ref 1.4–7.0)
Neutrophils: 73 %
Platelets: 284 10*3/uL (ref 150–450)
RBC: 4.4 x10E6/uL (ref 4.14–5.80)
RDW: 13.7 % (ref 11.6–15.4)
WBC: 7 10*3/uL (ref 3.4–10.8)

## 2023-12-27 LAB — HEMOGLOBIN A1C
Est. average glucose Bld gHb Est-mCnc: 140 mg/dL
Hgb A1c MFr Bld: 6.5 % — ABNORMAL HIGH (ref 4.8–5.6)

## 2023-12-27 LAB — URIC ACID: Uric Acid: 4.2 mg/dL (ref 3.8–8.4)

## 2024-01-01 ENCOUNTER — Ambulatory Visit: Payer: Self-pay | Admitting: Family Medicine

## 2024-01-01 ENCOUNTER — Encounter: Payer: Self-pay | Admitting: Family Medicine

## 2024-01-17 ENCOUNTER — Other Ambulatory Visit: Payer: Self-pay | Admitting: Family Medicine

## 2024-01-17 DIAGNOSIS — E1159 Type 2 diabetes mellitus with other circulatory complications: Secondary | ICD-10-CM

## 2024-01-18 ENCOUNTER — Other Ambulatory Visit: Payer: Self-pay | Admitting: Family Medicine

## 2024-01-20 ENCOUNTER — Telehealth: Payer: Self-pay | Admitting: Family Medicine

## 2024-01-20 ENCOUNTER — Other Ambulatory Visit: Payer: Self-pay

## 2024-01-20 MED ORDER — FENOFIBRATE 145 MG PO TABS: 145.0000 mg | ORAL_TABLET | Freq: Every day | ORAL | 3 refills | Status: AC

## 2024-01-20 MED ORDER — ROSUVASTATIN CALCIUM 40 MG PO TABS: 40.0000 mg | ORAL_TABLET | Freq: Every day | ORAL | 3 refills | Status: AC

## 2024-01-20 NOTE — Telephone Encounter (Addendum)
 [facility] faxed refill request for the following medications:  rosuvastatin  (CRESTOR ) 40 MG tablet   fenofibrate  (TRICOR ) 145 MG tablet     Please advise.

## 2024-01-31 ENCOUNTER — Ambulatory Visit: Admitting: Family Medicine

## 2024-02-13 ENCOUNTER — Ambulatory Visit: Admitting: Family Medicine

## 2024-03-27 ENCOUNTER — Ambulatory Visit: Admitting: Family Medicine

## 2024-04-14 ENCOUNTER — Ambulatory Visit: Admitting: Family Medicine
